# Patient Record
Sex: Female | Born: 1970
Health system: Southern US, Community
[De-identification: ages and names within clinical notes are randomized; demographics above are authoritative.]

## PROBLEM LIST (undated history)

## (undated) DIAGNOSIS — F32A Depression, unspecified: Secondary | ICD-10-CM

## (undated) DIAGNOSIS — K219 Gastro-esophageal reflux disease without esophagitis: Secondary | ICD-10-CM

## (undated) DIAGNOSIS — F329 Major depressive disorder, single episode, unspecified: Secondary | ICD-10-CM

## (undated) DIAGNOSIS — Z8489 Family history of other specified conditions: Secondary | ICD-10-CM

## (undated) DIAGNOSIS — F4310 Post-traumatic stress disorder, unspecified: Secondary | ICD-10-CM

## (undated) DIAGNOSIS — M1990 Unspecified osteoarthritis, unspecified site: Secondary | ICD-10-CM

## (undated) DIAGNOSIS — Z9889 Other specified postprocedural states: Secondary | ICD-10-CM

## (undated) DIAGNOSIS — A4902 Methicillin resistant Staphylococcus aureus infection, unspecified site: Secondary | ICD-10-CM

## (undated) DIAGNOSIS — G709 Myoneural disorder, unspecified: Secondary | ICD-10-CM

## (undated) DIAGNOSIS — L732 Hidradenitis suppurativa: Secondary | ICD-10-CM

## (undated) DIAGNOSIS — I10 Essential (primary) hypertension: Secondary | ICD-10-CM

## (undated) DIAGNOSIS — R112 Nausea with vomiting, unspecified: Secondary | ICD-10-CM

## (undated) DIAGNOSIS — E781 Pure hyperglyceridemia: Secondary | ICD-10-CM

## (undated) HISTORY — PX: OTHER SURGICAL HISTORY: SHX169

## (undated) HISTORY — DX: Hidradenitis suppurativa: L73.2

## (undated) HISTORY — PX: THORACIC DISC SURGERY: SHX801

## (undated) HISTORY — PX: COCHLEAR IMPLANT: SUR684

## (undated) HISTORY — DX: Pure hyperglyceridemia: E78.1

## (undated) HISTORY — PX: BREAST EXCISIONAL BIOPSY: SUR124

## (undated) HISTORY — PX: INNER EAR SURGERY: SHX679

## (undated) HISTORY — PX: TUBAL LIGATION: SHX77

---

## 1998-03-25 ENCOUNTER — Other Ambulatory Visit: Admission: RE | Admit: 1998-03-25 | Discharge: 1998-03-25 | Payer: Self-pay | Admitting: Family Medicine

## 1999-09-08 ENCOUNTER — Emergency Department (HOSPITAL_COMMUNITY): Admission: EM | Admit: 1999-09-08 | Discharge: 1999-09-08 | Payer: Self-pay

## 2004-09-12 HISTORY — PX: ABDOMINOPLASTY: SHX5355

## 2005-07-14 ENCOUNTER — Ambulatory Visit: Payer: Self-pay | Admitting: Family Medicine

## 2006-04-25 ENCOUNTER — Ambulatory Visit: Payer: Self-pay | Admitting: Family Medicine

## 2006-05-11 ENCOUNTER — Ambulatory Visit: Payer: Self-pay | Admitting: Family Medicine

## 2007-04-27 ENCOUNTER — Ambulatory Visit (HOSPITAL_COMMUNITY): Admission: RE | Admit: 2007-04-27 | Discharge: 2007-04-27 | Payer: Self-pay | Admitting: Obstetrics and Gynecology

## 2007-06-27 ENCOUNTER — Encounter: Payer: Self-pay | Admitting: Obstetrics & Gynecology

## 2007-06-27 ENCOUNTER — Ambulatory Visit (HOSPITAL_COMMUNITY): Admission: RE | Admit: 2007-06-27 | Discharge: 2007-06-27 | Payer: Self-pay | Admitting: Obstetrics & Gynecology

## 2008-05-28 ENCOUNTER — Other Ambulatory Visit: Admission: RE | Admit: 2008-05-28 | Discharge: 2008-05-28 | Payer: Self-pay | Admitting: Obstetrics and Gynecology

## 2008-05-30 ENCOUNTER — Ambulatory Visit (HOSPITAL_COMMUNITY): Admission: RE | Admit: 2008-05-30 | Discharge: 2008-05-30 | Payer: Self-pay | Admitting: Obstetrics & Gynecology

## 2008-06-02 ENCOUNTER — Ambulatory Visit (HOSPITAL_COMMUNITY): Admission: RE | Admit: 2008-06-02 | Discharge: 2008-06-02 | Payer: Self-pay | Admitting: Obstetrics & Gynecology

## 2008-07-02 ENCOUNTER — Encounter: Payer: Self-pay | Admitting: Obstetrics & Gynecology

## 2008-07-02 ENCOUNTER — Inpatient Hospital Stay (HOSPITAL_COMMUNITY): Admission: RE | Admit: 2008-07-02 | Discharge: 2008-07-03 | Payer: Self-pay | Admitting: Obstetrics & Gynecology

## 2008-09-12 HISTORY — PX: ABDOMINAL HYSTERECTOMY: SHX81

## 2009-08-11 ENCOUNTER — Encounter: Admission: RE | Admit: 2009-08-11 | Discharge: 2009-08-11 | Payer: Self-pay | Admitting: Family Medicine

## 2009-09-01 ENCOUNTER — Encounter: Admission: RE | Admit: 2009-09-01 | Discharge: 2009-09-01 | Payer: Self-pay | Admitting: Neurosurgery

## 2009-09-18 ENCOUNTER — Ambulatory Visit (HOSPITAL_COMMUNITY): Admission: RE | Admit: 2009-09-18 | Discharge: 2009-09-18 | Payer: Self-pay | Admitting: Neurosurgery

## 2010-11-17 ENCOUNTER — Ambulatory Visit
Admission: RE | Admit: 2010-11-17 | Discharge: 2010-11-17 | Disposition: A | Discharge status: Home / Self Care | Payer: BC Managed Care – PPO | Source: Ambulatory Visit | Attending: Neurosurgery | Admitting: Neurosurgery

## 2010-11-17 ENCOUNTER — Other Ambulatory Visit: Payer: Self-pay | Admitting: Neurosurgery

## 2010-11-17 DIAGNOSIS — M542 Cervicalgia: Secondary | ICD-10-CM

## 2010-11-17 DIAGNOSIS — M25512 Pain in left shoulder: Secondary | ICD-10-CM

## 2010-11-28 LAB — CBC
HCT: 43 % (ref 36.0–46.0)
Hemoglobin: 15.1 g/dL — ABNORMAL HIGH (ref 12.0–15.0)
MCHC: 35.2 g/dL (ref 30.0–36.0)
MCV: 95.3 fL (ref 78.0–100.0)
RDW: 12.3 % (ref 11.5–15.5)

## 2010-11-28 LAB — BASIC METABOLIC PANEL
CO2: 27 mEq/L (ref 19–32)
Glucose, Bld: 126 mg/dL — ABNORMAL HIGH (ref 70–99)
Potassium: 4.3 mEq/L (ref 3.5–5.1)
Sodium: 137 mEq/L (ref 135–145)

## 2010-11-29 ENCOUNTER — Other Ambulatory Visit: Payer: Self-pay | Admitting: Obstetrics & Gynecology

## 2010-11-29 DIAGNOSIS — Z1231 Encounter for screening mammogram for malignant neoplasm of breast: Secondary | ICD-10-CM

## 2010-12-10 ENCOUNTER — Ambulatory Visit
Admission: RE | Admit: 2010-12-10 | Discharge: 2010-12-10 | Disposition: A | Discharge status: Home / Self Care | Payer: BC Managed Care – PPO | Source: Ambulatory Visit | Attending: Obstetrics & Gynecology | Admitting: Obstetrics & Gynecology

## 2010-12-10 DIAGNOSIS — Z1231 Encounter for screening mammogram for malignant neoplasm of breast: Secondary | ICD-10-CM

## 2011-01-25 NOTE — Op Note (Signed)
NAMEJANECIA, Aimee [name]              ACCOUNT NO.:  [id number]   MEDICAL RECORD NO.:  [id number]          PATIENT TYPE:  AMB   LOCATION:  DAY                           FACILITY:  [facility]   PHYSICIAN:  Florian Buff, M.D.   DATE OF BIRTH:  1970/10/10   DATE OF PROCEDURE:  06/27/2007  DATE OF DISCHARGE:                               OPERATIVE REPORT   PREOPERATIVE DIAGNOSIS:  1. Menometrorrhagia.  2. Dysmenorrhea.   POSTOPERATIVE DIAGNOSIS:  1. Menometrorrhagia.  2. Dysmenorrhea.   PROCEDURE:  Hysteroscopy, D&C, endometrial ablation.   SURGEON:  Florian Buff, M.D.   ANESTHESIA:  General endotracheal.   FINDINGS:  The patient had a normal endometrial cavity, minimal amount  of endometrial tissue, no polyps, and no fibroids.   DESCRIPTION OF PROCEDURE:  The patient was taken to the operating room  and placed in the supine position where she underwent general  endotracheal anesthesia, placed in the dorsal lithotomy position and  prepped and draped in the usual sterile fashion.  Graves speculum was  placed.  Cervix was grasped.  Her cervix was dilated serially to allow  passage of the hysteroscope.  Hysteroscopy was performed and found to be  normal.  There were no polyps and no abnormalities at all.  ThermaChoice  3 endometrial ablation balloon was used, 19 mL of D5W required to  maintain appropriate pressure in the balloon during the entire  procedure.  Heated to 87 degrees Celsius.  Total therapy time was 9  minutes 14 seconds.  All the fluids were returned at the end of the  procedure.  The patient tolerated the procedure well.  She experienced  normal blood loss and taken to the recovery room in good stable  condition.  All needle, sponge, and instrument counts correct x3.      Florian Buff, M.D.  Electronically Signed     LHE/MEDQ  D:  06/27/2007  T:  06/27/2007  Job:  [id number]

## 2011-01-25 NOTE — Op Note (Signed)
Aimee [name], Aimee [name]              ACCOUNT NO.:  [id number]   MEDICAL RECORD NO.:  [id number]          PATIENT TYPE:  INP   LOCATION:  A305                          FACILITY:  [facility]   PHYSICIAN:  Florian Buff, M.D.   DATE OF BIRTH:  [birth year]   DATE OF PROCEDURE:  07/02/2008  DATE OF DISCHARGE:                               OPERATIVE REPORT   PREOPERATIVE DIAGNOSES:  1. Menometrorrhagia.  2. Dysmenorrhea.  3. Status post hysteroscopy, dilation and curettage, and endometrial      ablation a year ago.   POSTOPERATIVE DIAGNOSES:  1. Menometrorrhagia.  2. Dysmenorrhea.  3. Status post hysteroscopy, dilation and curettage, and endometrial      ablation a year ago.   PROCEDURE:  Total abdominal hysterectomy, left salpingo-oophorectomy.   SURGEON:  Florian Buff, MD   ANESTHESIA:  General endotracheal.   FINDINGS:  The patient has broad-based adhesions of her uterus to the  anterior abdominal wall on the left.  Her bladder was also adherent on  the side.  I think this is source of her pain.  Her right tube and ovary  were normal.  Otherwise, everything was normal.   DESCRIPTION OF OPERATION:  The patient was taken to the operating room  and placed in the supine position where she underwent general  endotracheal anesthesia.  The vagina was prepped.  Foley catheter was  placed.  The abdomen was prepped and draped in usual sterile fashion.  A  Pfannenstiel skin incision was made, carried down sharply to the rectus  fascia, scored in midline, and extended laterally.  The fascia was taken  off the muscles superiorly and inferiorly without difficulty.  The  muscles were divided and the peritoneal cavity was entered.  Alexis self-  retaining wound retractor was placed.  The left ovary was cross clamped  and cut.  The right round ligament was then suture ligated and cut.  The  utero-ovarian ligament on the right was then clamped, cut, and suture  ligated.  The round ligament on the  left was then identified that was  suture ligated and cut.  The infundibulopelvic ligament on the left was  then clamped and cut.  The bladder was taken down carefully off lower  uterine segment and the anterior abdominal wall.  The uterine vessels  were clamped, cut, and suture ligated bilaterally.  Serial pedicles were  taken down the cervix to the cardinal ligaments, each pedicle being  clamped, cut, and suture ligated.  The vagina was cross clamped and  specimen was removed. The vaginal angle sutures were placed and the  vagina was closed with interrupted figure-of-eight sutures.  The pelvis  was irrigated vigorously.  All pedicles were found to be hemostatic.  Inner sheath was placed.  The Alexis retractor was removed.  The muscles  were reapproximated loosely.  The fascia was closed using 0-Vicryl in a  running.  The subcutaneous tissues were reapproximated in 2 layers and a  subcuticular suture was then performed and Dermabond was placed.  The  patient tolerated the procedure well.  She experienced 350 mL of blood  loss.  She was taken to the recovery room in good and stable condition.  All counts were correct x3.      Florian Buff, M.D.  Electronically Signed     LHE/MEDQ  D:  07/02/2008  T:  07/03/2008  Job:  [id number]

## 2011-01-28 NOTE — Discharge Summary (Signed)
[name], Aimee [name]              ACCOUNT NO.:  [id number]   MEDICAL RECORD NO.:  [id number]          PATIENT TYPE:  INP   LOCATION:  A305                          FACILITY:  [facility]   PHYSICIAN:  Florian Buff, M.D.   DATE OF BIRTH:  1970-11-20   DATE OF ADMISSION:  07/02/2008  DATE OF DISCHARGE:  10/22/2009LH                               DISCHARGE SUMMARY   DISCHARGE DIAGNOSES:  1. Status post total abdominal hysterectomy-left salpingo-      oophorectomy.  2. Unremarkable postoperative course.   PROCEDURE:  Total abdominal hysterectomy-left salpingo-oophorectomy.   Please refer the history and physical as well as operative report,  details of admission hospital.   HOSPITAL COURSE:  The patient is admitted postoperatively.  Started on  clear liquids and regular diet well without symptoms, was extensively  ambulatory.  She really wanted to go home on the morning postop day #1  and she was doing well.  Her hemoglobin and hematocrit were stable at  11.4 and 32.6.  Her abdominal exam was benign.  Her incisions clean,  dry, and intact.  As a result, she was discharged to home in the morning  on postop day #1 in good and stable condition to follow up in the office  next week.  She was given Dilaudid and Motrin for pain and she will be  seen in the office followup next week.      Florian Buff, M.D.  Electronically Signed     LHE/MEDQ  D:  08/06/2008  T:  08/06/2008  Job:  [id number]

## 2011-03-07 ENCOUNTER — Other Ambulatory Visit: Payer: Self-pay | Admitting: Neurosurgery

## 2011-03-07 ENCOUNTER — Ambulatory Visit
Admission: RE | Admit: 2011-03-07 | Discharge: 2011-03-07 | Disposition: A | Discharge status: Home / Self Care | Payer: BC Managed Care – PPO | Source: Ambulatory Visit | Attending: Neurosurgery | Admitting: Neurosurgery

## 2011-03-07 DIAGNOSIS — M542 Cervicalgia: Secondary | ICD-10-CM

## 2011-06-13 LAB — HCG, QUANTITATIVE, PREGNANCY: hCG, Beta Chain, Quant, S: <2

## 2011-06-13 LAB — TYPE AND SCREEN

## 2011-06-13 LAB — COMPREHENSIVE METABOLIC PANEL
ALT: 17
AST: 15
Calcium: 8.9
GFR calc Af Amer: >60
Glucose, Bld: 97
Sodium: 135
Total Protein: 6.3

## 2011-06-13 LAB — URINE MICROSCOPIC-ADD ON

## 2011-06-13 LAB — DIFFERENTIAL
Basophils Absolute: 0
Basophils Relative: 0
Eosinophils Absolute: 0.1
Eosinophils Relative: 1
Lymphocytes Relative: 30

## 2011-06-13 LAB — CBC
HCT: 32.6 — ABNORMAL LOW
MCHC: 33.8
MCHC: 35
MCV: 96.3
Platelets: 211
RDW: 12.5
RDW: 12.6

## 2011-06-13 LAB — URINALYSIS, ROUTINE W REFLEX MICROSCOPIC
Bilirubin Urine: NEGATIVE
Nitrite: NEGATIVE
Specific Gravity, Urine: <1.005 — ABNORMAL LOW
pH: 6

## 2011-06-22 LAB — URINALYSIS, ROUTINE W REFLEX MICROSCOPIC
Bilirubin Urine: NEGATIVE
Glucose, UA: NEGATIVE
Hgb urine dipstick: NEGATIVE
Ketones, ur: NEGATIVE
Nitrite: NEGATIVE
Protein, ur: NEGATIVE
Specific Gravity, Urine: 1.015
Urobilinogen, UA: 0.2
pH: 6

## 2011-06-23 LAB — COMPREHENSIVE METABOLIC PANEL
ALT: 16
AST: 14
Albumin: 3.7
Alkaline Phosphatase: 45
BUN: 8
CO2: 22
Calcium: 8.6
Chloride: 107
Creatinine, Ser: 0.58
GFR calc Af Amer: >60
GFR calc non Af Amer: >60
Glucose, Bld: 114 — ABNORMAL HIGH
Potassium: 3.8
Sodium: 135
Total Bilirubin: 0.5
Total Protein: 6.3

## 2011-06-23 LAB — DIFFERENTIAL
Basophils Absolute: 0
Basophils Relative: 1
Eosinophils Absolute: 0.2
Eosinophils Relative: 3
Lymphocytes Relative: 35
Lymphs Abs: 2.7
Monocytes Absolute: 0.3
Monocytes Relative: 3
Neutro Abs: 4.6
Neutrophils Relative %: 58

## 2011-06-23 LAB — CBC
HCT: 40.1
Hemoglobin: 13.8
MCHC: 34.4
MCV: 93.8
Platelets: 253
RBC: 4.28
RDW: 12.6
WBC: 7.8

## 2011-08-01 ENCOUNTER — Ambulatory Visit
Admission: RE | Admit: 2011-08-01 | Discharge: 2011-08-01 | Disposition: A | Discharge status: Home / Self Care | Payer: BC Managed Care – PPO | Source: Ambulatory Visit | Attending: Family Medicine | Admitting: Family Medicine

## 2011-08-01 ENCOUNTER — Other Ambulatory Visit: Payer: Self-pay | Admitting: Family Medicine

## 2011-08-01 DIAGNOSIS — R1011 Right upper quadrant pain: Secondary | ICD-10-CM

## 2011-08-08 ENCOUNTER — Other Ambulatory Visit: Payer: Self-pay | Admitting: Family Medicine

## 2011-08-08 DIAGNOSIS — R1011 Right upper quadrant pain: Secondary | ICD-10-CM

## 2011-08-08 DIAGNOSIS — R12 Heartburn: Secondary | ICD-10-CM

## 2011-08-16 ENCOUNTER — Other Ambulatory Visit (HOSPITAL_COMMUNITY): Payer: BC Managed Care – PPO

## 2011-09-23 ENCOUNTER — Encounter: Payer: BC Managed Care – PPO | Attending: Adult Health | Admitting: *Deleted

## 2011-09-23 ENCOUNTER — Encounter: Payer: Self-pay | Admitting: *Deleted

## 2011-09-23 DIAGNOSIS — Z713 Dietary counseling and surveillance: Secondary | ICD-10-CM | POA: Insufficient documentation

## 2011-09-23 DIAGNOSIS — E119 Type 2 diabetes mellitus without complications: Secondary | ICD-10-CM | POA: Insufficient documentation

## 2011-09-23 NOTE — Progress Notes (Addendum)
Medical Nutrition Therapy:  Appt start time: 1030a   end time:  1200p.  Assessment:  Primary concerns today: T2DM, new onset w/ BGM.  Pt here with new onset T2DM for education and BGM. Reports severe hypoglycemic episode last night at work; sx included excessive perspiration, shakiness, weakness, and diarrhea/vomiting. Called husband after eating a few pieces of candy. Pt had not eaten since 11am. Excessive daily meal skipping, meals out, and lack of physical acitivity noted. Treating highs/lows, avoidance of meal skipping, and importance of exercise among topics discussed. Gave pt Verio IQ meter and instructed on use. Pt verbalized understanding of instruction. Pt will need prescription for strips/lancets.        MEDICATIONS: See medication list; provided by pt at visit.  OTHER: Reports abdomnioplasty in 2006 after wt loss of 100+ lbs during previous 2 years.   DIETARY INTAKE:  Usual eating pattern includes 1-2 meals and 2-3 snacks per day.  24-hr recall:  B (11 AM): Eggs, fried bologna on wheat toast (3); crystal light  Snk (1 AM): handful of raisins Snk (1-2 hrs later): handful of dried cranberries "CRASHED" (low BG)   AT 6:30pm L ( PM): none Snk ( PM): none D ( PM): [employer] Snk ( PM): pretzels, light popcorn  Usual physical activity: None  Estimated energy needs: 1200-1400 calories 135-165 g carbohydrates 90-105 g protein 35-40 g fat  Progress Towards Goal(s):  In progress.   Nutritional Diagnosis:  [place]-2.1 Impaired nutrient utilization related to glucose metabolism as evidenced by recent diagnosis of T2DM and MD referral for education and blood glucose monitoring instruction.    Intervention/Goals (Overall plan):  Follow Diabetes Meal Plan as instructed (yellow card).  Eat 3 meals and 2-3 snacks daily; AVOID meal skipping, sweets, and sugary drinks!! :)  Limit carbohydrate intake to 30-45 grams carbohydrate/meal.  Limit carbohydrate intake to 15 grams  carbohydrate/snack.  Add lean protein foods to all meals/snacks.  Aim for 25-30 g of fiber daily.  Monitor glucose levels 1-2 times daily (fasting and after meal) or as instructed by your doctor.  Aim for 30 min or more of physical activity at least 3 days a week.  Bring food record and glucose log to your next nutrition visit.  Handouts given during visit include:  Living Well with Diabetes - [employer]  30g and 45g CHO meal plans  Snack List  Carb Counting Guide   Mr [place] Neighbor and Easy Diabetic Cooking (cookbook)  Samples dispensed:  Verio IQ starter kit Lot #: [id number] X Exp: 10/2012   Reli-On Glucose Tabs (4 pc/pack): 6 packs  Monitoring/Evaluation:  Dietary intake, exercise, A1c (as available), and body weight in 2-3weeks. Pt to check insurance coverage/work schedule and call for f/u appt.

## 2011-09-24 ENCOUNTER — Encounter: Payer: Self-pay | Admitting: *Deleted

## 2011-09-24 NOTE — Patient Instructions (Addendum)
Goals:  Follow Diabetes Meal Plan as instructed (yellow card).  Eat 3 meals and 2-3 snacks daily; AVOID meal skipping, sweets, and sugary drinks!! :)  Limit carbohydrate intake to 30-45 grams carbohydrate/meal.  Limit carbohydrate intake to 15 grams carbohydrate/snack.  Add lean protein foods to all meals/snacks.  Aim for 25-30 g of fiber daily.  Monitor glucose levels 1-2 times daily (fasting and after meal) or as instructed by your doctor.  Aim for 30 min or more of physical activity at least 3 days a week.  Bring food record and glucose log to your next nutrition visit.  Check insurance coverage/work schedule and call for f/u appt.

## 2011-12-14 ENCOUNTER — Other Ambulatory Visit: Payer: Self-pay | Admitting: Obstetrics & Gynecology

## 2011-12-14 DIAGNOSIS — Z1231 Encounter for screening mammogram for malignant neoplasm of breast: Secondary | ICD-10-CM

## 2011-12-29 ENCOUNTER — Ambulatory Visit
Admission: RE | Admit: 2011-12-29 | Discharge: 2011-12-29 | Disposition: A | Discharge status: Home / Self Care | Payer: BC Managed Care – PPO | Source: Ambulatory Visit | Attending: Obstetrics & Gynecology | Admitting: Obstetrics & Gynecology

## 2011-12-29 ENCOUNTER — Other Ambulatory Visit: Payer: Self-pay | Admitting: Obstetrics & Gynecology

## 2011-12-29 DIAGNOSIS — N63 Unspecified lump in unspecified breast: Secondary | ICD-10-CM

## 2011-12-29 DIAGNOSIS — Z1231 Encounter for screening mammogram for malignant neoplasm of breast: Secondary | ICD-10-CM

## 2012-01-08 ENCOUNTER — Emergency Department (INDEPENDENT_AMBULATORY_CARE_PROVIDER_SITE_OTHER): Payer: BC Managed Care – PPO

## 2012-01-08 ENCOUNTER — Emergency Department (HOSPITAL_COMMUNITY)
Admission: EM | Admit: 2012-01-08 | Discharge: 2012-01-08 | Disposition: A | Discharge status: Home / Self Care | Payer: BC Managed Care – PPO | Source: Home / Self Care | Attending: Family Medicine | Admitting: Family Medicine

## 2012-01-08 ENCOUNTER — Encounter (HOSPITAL_COMMUNITY): Payer: Self-pay

## 2012-01-08 DIAGNOSIS — M25519 Pain in unspecified shoulder: Secondary | ICD-10-CM

## 2012-01-08 DIAGNOSIS — M25512 Pain in left shoulder: Secondary | ICD-10-CM

## 2012-01-08 MED ORDER — MEPERIDINE HCL 50 MG PO TABS: 50.0000 mg | ORAL_TABLET | Freq: Four times a day (QID) | ORAL | Status: AC | PRN

## 2012-01-08 MED ORDER — IBUPROFEN 600 MG PO TABS: 600.0000 mg | ORAL_TABLET | Freq: Three times a day (TID) | ORAL | Status: AC | PRN

## 2012-01-08 MED ORDER — CYCLOBENZAPRINE HCL 10 MG PO TABS: 10.0000 mg | ORAL_TABLET | Freq: Two times a day (BID) | ORAL | Status: AC | PRN

## 2012-01-08 NOTE — ED Provider Notes (Addendum)
History     CSN: [id number]  Arrival date & time 01/08/12  1508   First MD Initiated Contact with Patient 01/08/12 1516      Chief Complaint  Patient presents with  . Shoulder Injury    2 day hx of shoulder pain.  on friday was reaching for something on the table, when she felt her shoulder "pop".  has happened to pt. in past as well.     (Consider location/radiation/quality/duration/timing/severity/associated sxs/prior treatment) HPI Comments: 41 year old female smoker with history of diabetes and neck surgery in 2010. Here complaining of severe left shoulder pain with left arm movement for 2 days. Patient states that she was reaching over a dining table, heard a pop and started to feel pain and difficulty to raise her left arm. Symptoms appear worse today despite outpatient taking leftover Demerol pills and Flexeril. Has not taken any anti-inflammatory medication. Works as Optometrist. Patient has severe pruritus allergies to Vicodin, Percocet and tramadol. States the only oral narcotic she has been able to take without side effects is Demerol.   Past Medical History  Diagnosis Date  . Diabetes mellitus   . Hypertriglyceridemia     Past Surgical History  Procedure Date  . Abdominoplasty 2006  . Inner ear surgery     12 surgeries on R ear starting in 1984; implant 06/2011  . Thoracic disc surgery 2011, 2012    x2  . Abdominal hysterectomy 2010  . Cesarean section 1991, 1995    x2    Family History  Problem Relation Age of Onset  . GI problems Mother   . Diabetes Father     History  Substance Use Topics  . Smoking status: Current Everyday Smoker -- 1.0 packs/day    Types: Cigarettes  . Smokeless tobacco: Not on file  . Alcohol Use: Yes    OB History    Grav Para Term Preterm Abortions TAB SAB Ect Mult Living                  Review of Systems  Constitutional: Negative for fever and chills.  HENT: Negative for ear pain, sore throat, neck pain and neck  stiffness.   Respiratory: Negative for cough and shortness of breath.   Cardiovascular: Negative for chest pain, palpitations and leg swelling.  Musculoskeletal:       Left shoulder pain as per HPI   Skin: Negative for rash.  Neurological: Negative for headaches.    Allergies  Hydrocodone  Home Medications   Current Outpatient Rx  Name Route Sig Dispense Refill  . VITAMIN C 1000 MG PO TABS Oral Take 1,000 mg by mouth daily.    . ASPIRIN 325 MG PO TABS Oral Take 325 mg by mouth daily.    Marland Kitchen CALCIUM CARBONATE 600 MG PO TABS Oral Take 600 mg by mouth 2 (two) times daily with a meal.    . CARISOPRODOL 250 MG PO TABS Oral Take 250 mg by mouth 3 (three) times daily.    . CHOLINE FENOFIBRATE 135 MG PO CPDR Oral Take 135 mg by mouth daily.    Marland Kitchen FLAX SEED OIL PO Oral Take 1,200 mg by mouth daily.    Marland Kitchen GARLIC 7858 MG PO CAPS Oral Take 1 capsule by mouth daily.    Marland Kitchen METFORMIN HCL 500 MG PO TABS Oral Take 500 mg by mouth daily.    Marland Kitchen MINOCYCLINE HCL PO Oral Take by mouth 2 (two) times daily.    Marland Kitchen NIACIN ER (ANTIHYPERLIPIDEMIC) 1000  MG PO TBCR Oral Take 1,000 mg by mouth at bedtime.    Marland Kitchen FISH OIL 300 MG PO CAPS Oral Take 1 capsule by mouth 2 (two) times daily.    Marland Kitchen OMEPRAZOLE 20 MG PO CPDR Oral Take 20 mg by mouth daily.    Marland Kitchen SIMVASTATIN 20 MG PO TABS Oral Take 20 mg by mouth daily.    . CYCLOBENZAPRINE HCL 10 MG PO TABS Oral Take 1 tablet (10 mg total) by mouth 2 (two) times daily as needed for muscle spasms. 20 tablet 0  . IBUPROFEN 600 MG PO TABS Oral Take 1 tablet (600 mg total) by mouth every 8 (eight) hours as needed for pain. 20 tablet 0  . MEPERIDINE HCL 50 MG PO TABS Oral Take 1 tablet (50 mg total) by mouth every 6 (six) hours as needed for pain. 8 tablet 0    BP 120/82  Pulse 85  Temp(Src) 98.2 F (36.8 C) (Oral)  Resp 17  SpO2 98%  Physical Exam  Nursing note and vitals reviewed. Constitutional: She is oriented to person, place, and time. She appears well-developed and  well-nourished. No distress.       Uncomfortable with pain.  HENT:  Head: Normocephalic and atraumatic.  Neck: Neck supple.       No neck tenderness to palpation or movement. Fair range of motion. Negative Spurling test.  Cardiovascular: Normal rate, regular rhythm and normal heart sounds.   Pulmonary/Chest: Effort normal and breath sounds normal. No respiratory distress. She has no wheezes. She has no rales. She exhibits no tenderness.  Musculoskeletal:       Left shoulder: impress somehow flattened and dropped compared with right side. No swelling or erythema.  Diffused tenderness with palpation in anterior lateral distribution with focal tenderness over AC joint. difficult examination as patient reports severe pain and shoulder weakness. Unable to abduct arm pass 10 degree. Also pain and limited range of movement with adduction. Patient reports severe pain as well with passive abduction and passive internal and external arm rotation.  Intact superficial sensation.  Normal examination of elbow, forearm and hand with no weakness or numbness. Normal distal radial and ulnar pulses.   Neurological: She is alert and oriented to person, place, and time.  Skin: No rash noted.    ED Course  Procedures (including critical care time)  Labs Reviewed - No data to display Dg Shoulder Left  01/08/2012  *RADIOLOGY REPORT*  Clinical Data: Left shoulder pain after reaching 2 days ago.  LEFT SHOULDER - 2+ VIEW  Comparison: Cervical spine CT 09/01/2009.  Findings: The mineralization and alignment are normal.  There is no evidence of acute fracture or dislocation.  The subacromial space is preserved.  There are mild acromioclavicular degenerative changes.  Postsurgical changes are noted in the lower cervical spine status post fusion.  IMPRESSION: No acute osseous findings.  Original Report Authenticated By: Vivia Ewing, M.D.     1. Shoulder pain, left       MDM  Mechanism of injury and xray  findings suggestive of AC joint sprain but reported pain with passive motion suggest labral tear or disease of the capsula; shoulder weakness to arm elevation suggest rotator cuff tendon tear.  Asked to keep using the sling. As per patient request prescribe Demerol is the only narcotic that she can tolerate without side effects, also refilled Flexeril and prescribed ibuprofen. Recommended close followup with the orthopedic doctor.  Note: pt with reported allergies to other oral narcotics than  demerol, offered Toradol injection but declined stating "probably that wont be as strong as demerol I am already taking".        Randa Spike, MD 01/09/12 1037  Alizeh Madril Moreno-Coll, MD 01/09/12 1040

## 2012-01-08 NOTE — ED Notes (Signed)
2 day hx of shoulder pain.  on friday was reaching for something on the table, when she felt her shoulder "pop".  has happened to pt. in past as well.

## 2012-01-08 NOTE — Discharge Instructions (Signed)
There are no bone fractures or displacements in your x-rays. It is possible that you have a rotator cuff tendon rupture or you have a tear in the shoulder joint capsule given the severity of your pain and the inability to raise your arm even with assistance. My recommendation is that you followup tomorrow with a orthopedic doctor in 2 very likely will require an MRI of your shoulder. In the meantime take the prescribed medications as instructed. Be aware that Flexeril and Demerol can make you drowsy and he should not drive after taking this medication. Continue to use a arm sling until you see the orthopedic specialist in the next following days.

## 2012-08-01 ENCOUNTER — Other Ambulatory Visit: Payer: Self-pay | Admitting: Family Medicine

## 2012-08-01 DIAGNOSIS — N649 Disorder of breast, unspecified: Secondary | ICD-10-CM

## 2012-08-08 ENCOUNTER — Other Ambulatory Visit: Payer: BC Managed Care – PPO

## 2012-08-10 ENCOUNTER — Ambulatory Visit
Admission: RE | Admit: 2012-08-10 | Discharge: 2012-08-10 | Disposition: A | Discharge status: Home / Self Care | Payer: BC Managed Care – PPO | Source: Ambulatory Visit | Attending: Family Medicine | Admitting: Family Medicine

## 2012-08-10 DIAGNOSIS — N649 Disorder of breast, unspecified: Secondary | ICD-10-CM

## 2012-10-31 ENCOUNTER — Other Ambulatory Visit: Payer: Self-pay | Admitting: Physician Assistant

## 2012-10-31 DIAGNOSIS — D229 Melanocytic nevi, unspecified: Secondary | ICD-10-CM

## 2012-10-31 HISTORY — DX: Melanocytic nevi, unspecified: D22.9

## 2012-11-22 ENCOUNTER — Other Ambulatory Visit: Payer: Self-pay | Admitting: Physician Assistant

## 2012-12-13 ENCOUNTER — Other Ambulatory Visit: Payer: Self-pay

## 2012-12-13 DIAGNOSIS — Z1231 Encounter for screening mammogram for malignant neoplasm of breast: Secondary | ICD-10-CM

## 2013-01-03 ENCOUNTER — Telehealth: Payer: Self-pay | Admitting: Family Medicine

## 2013-01-03 NOTE — Telephone Encounter (Signed)
PT STATES THAT HER SYMPTOMS HAVE BEEN GOING ON ANYWHERE FROM 2 MO TO 6 MOS. SHE THINK IT IS MORE MUSCLE AND/OR JOINT RELATED. PT OFFERED A Monday APPT- BUT SHE WILL POSSIBLY TRY URGENT CARE - IF DECIDES TO TAKE MON APPT- SHE WILL CALL us BACK

## 2013-01-14 ENCOUNTER — Ambulatory Visit: Payer: BC Managed Care – PPO

## 2013-01-31 ENCOUNTER — Telehealth: Payer: Self-pay | Admitting: Nurse Practitioner

## 2013-01-31 ENCOUNTER — Telehealth: Payer: Self-pay | Admitting: Adult Health

## 2013-01-31 MED ORDER — FLUCONAZOLE 150 MG PO TABS: ORAL_TABLET | ORAL | Status: DC

## 2013-01-31 NOTE — Telephone Encounter (Signed)
Has had some urinary incontinence and low pelvic discomfort, has yellow stool too.Needs to make appt. She is taking antibiotic and wants diflucan, will call in.

## 2013-01-31 NOTE — Telephone Encounter (Signed)
No answer

## 2013-01-31 NOTE — Addendum Note (Signed)
Addended by: Derrek Monaco A on: 01/31/2013 07:29 PM   Modules accepted: Orders

## 2013-02-05 NOTE — Telephone Encounter (Signed)
LM ON 5/22 AND 5/23- NO RESPONSE FROM PT- SHE CAN CALL BACK IF APPT STILL NEEDED

## 2013-02-08 ENCOUNTER — Other Ambulatory Visit: Payer: Self-pay | Admitting: Family Medicine

## 2013-02-20 ENCOUNTER — Ambulatory Visit
Admission: RE | Admit: 2013-02-20 | Discharge: 2013-02-20 | Disposition: A | Discharge status: Home / Self Care | Payer: BC Managed Care – PPO | Source: Ambulatory Visit

## 2013-02-20 DIAGNOSIS — Z1231 Encounter for screening mammogram for malignant neoplasm of breast: Secondary | ICD-10-CM

## 2013-03-05 ENCOUNTER — Encounter: Payer: Self-pay | Admitting: Physician Assistant

## 2013-03-05 ENCOUNTER — Ambulatory Visit (INDEPENDENT_AMBULATORY_CARE_PROVIDER_SITE_OTHER): Payer: BC Managed Care – PPO | Admitting: Physician Assistant

## 2013-03-05 VITALS — BP 117/82 | HR 88 | Temp 98.2°F | Wt 222.0 lb

## 2013-03-05 DIAGNOSIS — E119 Type 2 diabetes mellitus without complications: Secondary | ICD-10-CM | POA: Insufficient documentation

## 2013-03-05 DIAGNOSIS — F17200 Nicotine dependence, unspecified, uncomplicated: Secondary | ICD-10-CM | POA: Insufficient documentation

## 2013-03-05 DIAGNOSIS — IMO0001 Reserved for inherently not codable concepts without codable children: Secondary | ICD-10-CM | POA: Insufficient documentation

## 2013-03-05 DIAGNOSIS — IMO0002 Reserved for concepts with insufficient information to code with codable children: Secondary | ICD-10-CM

## 2013-03-05 MED ORDER — CYCLOBENZAPRINE HCL 10 MG PO TABS: 10.0000 mg | ORAL_TABLET | Freq: Three times a day (TID) | ORAL | Status: DC | PRN

## 2013-03-05 NOTE — Patient Instructions (Signed)

## 2013-03-05 NOTE — Progress Notes (Signed)
Subjective:     Patient ID: Aimee [name], female   DOB: 10/30/1970, 42 y.o.   MRN: [id number]  HPI Pt with a long hx of intermit chronic back pain Prev hx of multi level cervical fusion due to disc dz She has been seen intermit and given rx for muscle relaxants which help but sx then return Now with pain to the L shoulder and weakness to the lower ext She denies any trauma Wanting referral back to Neurosurg or imaging studies   Review of Systems  All other systems reviewed and are negative.       Objective:   Physical Exam  Nursing note and vitals reviewed. Pt unable to sit still for long + TTP along the entire T- spine area FROM L-spine but sx with flexion, extension, rotation at the extremes SLR neg LE muscle strength equal bilat DTR 1+/= lower ext Pulses/sensory good distal     Assessment:     Chronic back pain    Plan:    Given complicated hx and myriad of sx return to Dr Hal Neer Rf of Flexeril No studies at this time F/U prn

## 2013-03-13 ENCOUNTER — Other Ambulatory Visit: Payer: Self-pay | Admitting: Family Medicine

## 2013-03-25 ENCOUNTER — Other Ambulatory Visit: Payer: Self-pay | Admitting: Neurosurgery

## 2013-03-25 DIAGNOSIS — M5412 Radiculopathy, cervical region: Secondary | ICD-10-CM

## 2013-03-25 DIAGNOSIS — M549 Dorsalgia, unspecified: Secondary | ICD-10-CM

## 2013-03-28 ENCOUNTER — Ambulatory Visit
Admission: RE | Admit: 2013-03-28 | Discharge: 2013-03-28 | Disposition: A | Discharge status: Home / Self Care | Payer: BC Managed Care – PPO | Source: Ambulatory Visit | Attending: Neurosurgery | Admitting: Neurosurgery

## 2013-03-28 VITALS — BP 109/76 | HR 77

## 2013-03-28 DIAGNOSIS — M549 Dorsalgia, unspecified: Secondary | ICD-10-CM

## 2013-03-28 DIAGNOSIS — M5412 Radiculopathy, cervical region: Secondary | ICD-10-CM

## 2013-03-28 DIAGNOSIS — IMO0002 Reserved for concepts with insufficient information to code with codable children: Secondary | ICD-10-CM

## 2013-03-28 MED ORDER — HYDROMORPHONE HCL PF 2 MG/ML IJ SOLN
2.0000 mg | Freq: Once | INTRAMUSCULAR | Status: AC
  Administered 2013-03-28: 1 mg via INTRAMUSCULAR

## 2013-03-28 MED ORDER — ONDANSETRON HCL 4 MG/2ML IJ SOLN
4.0000 mg | Freq: Once | INTRAMUSCULAR | Status: AC
  Administered 2013-03-28: 4 mg via INTRAMUSCULAR

## 2013-03-28 MED ORDER — IOHEXOL 300 MG/ML  SOLN
10.0000 mL | Freq: Once | INTRAMUSCULAR | Status: AC | PRN
  Administered 2013-03-28: 10 mL via INTRATHECAL

## 2013-03-28 MED ORDER — DIAZEPAM 5 MG PO TABS
10.0000 mg | ORAL_TABLET | Freq: Once | ORAL | Status: AC
  Administered 2013-03-28: 10 mg via ORAL

## 2013-03-28 MED ORDER — HYDROMORPHONE HCL PF 2 MG/ML IJ SOLN
2.0000 mg | Freq: Once | INTRAMUSCULAR | Status: AC
  Administered 2013-03-28: 2 mg via INTRAMUSCULAR

## 2013-03-28 NOTE — Progress Notes (Signed)
Patient's mother notified of 1015 discharge time.

## 2013-04-01 ENCOUNTER — Ambulatory Visit
Admission: RE | Admit: 2013-04-01 | Discharge: 2013-04-01 | Disposition: A | Discharge status: Home / Self Care | Payer: BC Managed Care – PPO | Source: Ambulatory Visit | Attending: Neurosurgery | Admitting: Neurosurgery

## 2013-04-01 ENCOUNTER — Other Ambulatory Visit: Payer: Self-pay | Admitting: Neurosurgery

## 2013-04-01 DIAGNOSIS — G971 Other reaction to spinal and lumbar puncture: Secondary | ICD-10-CM

## 2013-04-01 MED ORDER — IOHEXOL 180 MG/ML  SOLN
1.0000 mL | Freq: Once | INTRAMUSCULAR | Status: AC | PRN
  Administered 2013-04-01: 1 mL via EPIDURAL

## 2013-04-01 NOTE — Progress Notes (Signed)
1420 pt returned from blood patch to nursing area. Pt resting quietly.

## 2013-04-01 NOTE — Progress Notes (Signed)
Blood drawn for blood patch from right AC. Site is unremarkable, pt tolerated well and 20 cc obtained

## 2013-04-23 ENCOUNTER — Other Ambulatory Visit (INDEPENDENT_AMBULATORY_CARE_PROVIDER_SITE_OTHER): Payer: BC Managed Care – PPO

## 2013-04-23 DIAGNOSIS — E119 Type 2 diabetes mellitus without complications: Secondary | ICD-10-CM

## 2013-04-23 DIAGNOSIS — E669 Obesity, unspecified: Secondary | ICD-10-CM

## 2013-04-23 DIAGNOSIS — E785 Hyperlipidemia, unspecified: Secondary | ICD-10-CM

## 2013-04-23 NOTE — Progress Notes (Signed)
Patient came in with orders for Dr.Nida

## 2013-04-24 LAB — CMP14+EGFR
AST: 27 IU/L (ref 0–40)
BUN: 7 mg/dL (ref 6–24)
CO2: 23 mmol/L (ref 18–29)
Calcium: 9.6 mg/dL (ref 8.7–10.2)
Chloride: 105 mmol/L (ref 97–108)
Creatinine, Ser: 0.58 mg/dL (ref 0.57–1.00)
Globulin, Total: 1.9 g/dL (ref 1.5–4.5)
Sodium: 142 mmol/L (ref 134–144)

## 2013-04-24 LAB — LIPID PANEL
Chol/HDL Ratio: 5.9 ratio units — ABNORMAL HIGH (ref 0.0–4.4)
Cholesterol, Total: 225 mg/dL — ABNORMAL HIGH (ref 100–199)
HDL: 38 mg/dL — ABNORMAL LOW (ref >39)
Triglycerides: 267 mg/dL — ABNORMAL HIGH (ref 0–149)

## 2013-04-24 LAB — TSH: TSH: 1.31 u[IU]/mL (ref 0.450–4.500)

## 2013-05-15 ENCOUNTER — Other Ambulatory Visit: Payer: Self-pay | Admitting: Adult Health

## 2013-06-15 ENCOUNTER — Other Ambulatory Visit: Payer: Self-pay | Admitting: Adult Health

## 2013-07-18 ENCOUNTER — Other Ambulatory Visit: Payer: Self-pay

## 2013-07-20 ENCOUNTER — Other Ambulatory Visit: Payer: Self-pay | Admitting: Adult Health

## 2013-07-25 ENCOUNTER — Other Ambulatory Visit (INDEPENDENT_AMBULATORY_CARE_PROVIDER_SITE_OTHER): Payer: BC Managed Care – PPO

## 2013-07-25 DIAGNOSIS — E669 Obesity, unspecified: Secondary | ICD-10-CM

## 2013-07-25 DIAGNOSIS — E785 Hyperlipidemia, unspecified: Secondary | ICD-10-CM

## 2013-07-25 DIAGNOSIS — I10 Essential (primary) hypertension: Secondary | ICD-10-CM

## 2013-07-25 LAB — HEMOGLOBIN A1C
Hgb A1c MFr Bld: 5.5 % (ref <5.7)
Mean Plasma Glucose: 111 mg/dL (ref <117)

## 2013-07-25 LAB — COMPLETE METABOLIC PANEL WITH GFR
ALT: 20 U/L (ref 0–35)
Albumin: 4 g/dL (ref 3.5–5.2)
CO2: 26 mEq/L (ref 19–32)
Chloride: 105 mEq/L (ref 96–112)
GFR, Est African American: >89 mL/min
GFR, Est Non African American: >89 mL/min
Glucose, Bld: 83 mg/dL (ref 70–99)
Potassium: 4.2 mEq/L (ref 3.5–5.3)
Sodium: 140 mEq/L (ref 135–145)
Total Protein: 6.8 g/dL (ref 6.0–8.3)

## 2013-07-25 LAB — LIPID PANEL
Cholesterol: 150 mg/dL (ref 0–200)
HDL: 38 mg/dL — ABNORMAL LOW
LDL Cholesterol: 73 mg/dL (ref 0–99)
Total CHOL/HDL Ratio: 3.9 ratio
Triglycerides: 195 mg/dL — ABNORMAL HIGH
VLDL: 39 mg/dL (ref 0–40)

## 2013-07-25 LAB — TSH: TSH: 0.885 u[IU]/mL (ref 0.350–4.500)

## 2013-07-25 LAB — T4, FREE: Free T4: 1.24 ng/dL (ref 0.80–1.80)

## 2013-07-25 NOTE — Addendum Note (Signed)
Addended by: Lyn Henri C on: 07/25/2013 10:08 AM   Modules accepted: Orders

## 2013-07-25 NOTE — Addendum Note (Signed)
Addended by: Lyn Henri C on: 07/25/2013 10:24 AM   Modules accepted: Orders

## 2013-07-25 NOTE — Addendum Note (Signed)
Addended by: Lyn Henri C on: 07/25/2013 10:27 AM   Modules accepted: Orders

## 2013-07-26 LAB — MICROALBUMIN, URINE: Microalb, Ur: 0.74 mg/dL (ref 0.00–1.89)

## 2013-12-06 ENCOUNTER — Other Ambulatory Visit: Payer: Self-pay | Admitting: Adult Health

## 2014-02-05 ENCOUNTER — Other Ambulatory Visit (INDEPENDENT_AMBULATORY_CARE_PROVIDER_SITE_OTHER): Payer: BC Managed Care – PPO

## 2014-02-05 DIAGNOSIS — E119 Type 2 diabetes mellitus without complications: Secondary | ICD-10-CM

## 2014-02-05 DIAGNOSIS — E559 Vitamin D deficiency, unspecified: Secondary | ICD-10-CM

## 2014-02-06 LAB — CMP14+EGFR
ALBUMIN: 3.8 g/dL (ref 3.5–5.5)
ALK PHOS: 27 IU/L — AB (ref 39–117)
ALT: 13 IU/L (ref 0–32)
AST: 12 IU/L (ref 0–40)
Albumin/Globulin Ratio: 1.7 (ref 1.1–2.5)
BUN / CREAT RATIO: 17 (ref 9–23)
BUN: 12 mg/dL (ref 6–24)
CO2: 23 mmol/L (ref 18–29)
CREATININE: 0.71 mg/dL (ref 0.57–1.00)
Calcium: 9.4 mg/dL (ref 8.7–10.2)
Chloride: 104 mmol/L (ref 97–108)
GFR calc Af Amer: 121 mL/min/{1.73_m2} (ref >59)
GFR calc non Af Amer: 105 mL/min/{1.73_m2} (ref >59)
GLOBULIN, TOTAL: 2.2 g/dL (ref 1.5–4.5)
Glucose: 81 mg/dL (ref 65–99)
Potassium: 4.1 mmol/L (ref 3.5–5.2)
Sodium: 144 mmol/L (ref 134–144)
Total Bilirubin: 0.2 mg/dL (ref 0.0–1.2)
Total Protein: 6 g/dL (ref 6.0–8.5)

## 2014-02-06 LAB — LIPID PANEL
CHOL/HDL RATIO: 3.2 ratio (ref 0.0–4.4)
CHOLESTEROL TOTAL: 135 mg/dL (ref 100–199)
HDL: 42 mg/dL (ref >39)
LDL Calculated: 75 mg/dL (ref 0–99)
TRIGLYCERIDES: 91 mg/dL (ref 0–149)
VLDL Cholesterol Cal: 18 mg/dL (ref 5–40)

## 2014-02-06 LAB — HEMOGLOBIN A1C
ESTIMATED AVERAGE GLUCOSE: 105 mg/dL
HEMOGLOBIN A1C: 5.3 % (ref 4.8–5.6)

## 2014-02-06 LAB — VITAMIN D 25 HYDROXY (VIT D DEFICIENCY, FRACTURES): Vit D, 25-Hydroxy: 33.7 ng/mL (ref 30.0–100.0)

## 2014-03-01 ENCOUNTER — Other Ambulatory Visit: Payer: Self-pay | Admitting: Adult Health

## 2014-04-04 ENCOUNTER — Other Ambulatory Visit: Payer: Self-pay

## 2014-04-04 DIAGNOSIS — Z1231 Encounter for screening mammogram for malignant neoplasm of breast: Secondary | ICD-10-CM

## 2014-04-29 ENCOUNTER — Ambulatory Visit: Payer: BC Managed Care – PPO

## 2014-05-02 ENCOUNTER — Ambulatory Visit
Admission: RE | Admit: 2014-05-02 | Discharge: 2014-05-02 | Disposition: A | Discharge status: Home / Self Care | Payer: BC Managed Care – PPO | Source: Ambulatory Visit

## 2014-05-02 DIAGNOSIS — Z1231 Encounter for screening mammogram for malignant neoplasm of breast: Secondary | ICD-10-CM

## 2014-05-09 ENCOUNTER — Telehealth: Payer: Self-pay | Admitting: Nurse Practitioner

## 2014-05-09 NOTE — Telephone Encounter (Signed)
Discussed normal results with patient.

## 2014-05-12 DIAGNOSIS — I83813 Varicose veins of bilateral lower extremities with pain: Secondary | ICD-10-CM | POA: Insufficient documentation

## 2014-05-22 ENCOUNTER — Other Ambulatory Visit: Payer: Self-pay | Admitting: Adult Health

## 2014-05-29 ENCOUNTER — Other Ambulatory Visit: Payer: Self-pay | Admitting: Specialist

## 2014-05-29 DIAGNOSIS — M5416 Radiculopathy, lumbar region: Secondary | ICD-10-CM

## 2014-06-03 ENCOUNTER — Ambulatory Visit
Admission: RE | Admit: 2014-06-03 | Discharge: 2014-06-03 | Disposition: A | Discharge status: Home / Self Care | Payer: BC Managed Care – PPO | Source: Ambulatory Visit | Attending: Specialist | Admitting: Specialist

## 2014-06-03 DIAGNOSIS — M5416 Radiculopathy, lumbar region: Secondary | ICD-10-CM

## 2014-06-25 ENCOUNTER — Other Ambulatory Visit: Payer: BC Managed Care – PPO

## 2014-06-26 ENCOUNTER — Other Ambulatory Visit (INDEPENDENT_AMBULATORY_CARE_PROVIDER_SITE_OTHER): Payer: BC Managed Care – PPO

## 2014-06-26 DIAGNOSIS — E119 Type 2 diabetes mellitus without complications: Secondary | ICD-10-CM

## 2014-06-26 DIAGNOSIS — E7849 Other hyperlipidemia: Secondary | ICD-10-CM

## 2014-06-26 DIAGNOSIS — I10 Essential (primary) hypertension: Secondary | ICD-10-CM

## 2014-06-26 DIAGNOSIS — E669 Obesity, unspecified: Secondary | ICD-10-CM

## 2014-06-26 DIAGNOSIS — E784 Other hyperlipidemia: Secondary | ICD-10-CM

## 2014-06-27 LAB — LIPID PANEL
CHOLESTEROL TOTAL: 169 mg/dL (ref 100–199)
Chol/HDL Ratio: 2.7 ratio units (ref 0.0–4.4)
HDL: 63 mg/dL (ref >39)
LDL CALC: 92 mg/dL (ref 0–99)
TRIGLYCERIDES: 70 mg/dL (ref 0–149)
VLDL CHOLESTEROL CAL: 14 mg/dL (ref 5–40)

## 2014-06-27 LAB — CMP14+EGFR
A/G RATIO: 1.7 (ref 1.1–2.5)
ALBUMIN: 3.8 g/dL (ref 3.5–5.5)
ALT: 15 IU/L (ref 0–32)
AST: 10 IU/L (ref 0–40)
Alkaline Phosphatase: 29 IU/L — ABNORMAL LOW (ref 39–117)
BUN/Creatinine Ratio: 12 (ref 9–23)
BUN: 9 mg/dL (ref 6–24)
CALCIUM: 9.2 mg/dL (ref 8.7–10.2)
CO2: 25 mmol/L (ref 18–29)
CREATININE: 0.75 mg/dL (ref 0.57–1.00)
Chloride: 104 mmol/L (ref 97–108)
GFR calc Af Amer: 113 mL/min/{1.73_m2} (ref >59)
GFR, EST NON AFRICAN AMERICAN: 98 mL/min/{1.73_m2} (ref >59)
GLOBULIN, TOTAL: 2.3 g/dL (ref 1.5–4.5)
GLUCOSE: 77 mg/dL (ref 65–99)
Potassium: 4.3 mmol/L (ref 3.5–5.2)
SODIUM: 143 mmol/L (ref 134–144)
TOTAL PROTEIN: 6.1 g/dL (ref 6.0–8.5)
Total Bilirubin: 0.2 mg/dL (ref 0.0–1.2)

## 2014-06-27 LAB — HEMOGLOBIN A1C
ESTIMATED AVERAGE GLUCOSE: 114 mg/dL
Hgb A1c MFr Bld: 5.6 % (ref 4.8–5.6)

## 2014-06-27 LAB — T4, FREE: Free T4: 1.34 ng/dL (ref 0.82–1.77)

## 2014-06-27 LAB — TSH: TSH: 2.37 u[IU]/mL (ref 0.450–4.500)

## 2014-07-02 ENCOUNTER — Telehealth: Payer: Self-pay | Admitting: Family Medicine

## 2014-07-02 NOTE — Telephone Encounter (Signed)
LM.   Please call  Loni Beckwith MD.  This is the provider that requested these labs.  Call back if any problems.

## 2014-07-02 NOTE — Telephone Encounter (Signed)
LM for patient to call Dr. Dorris Fetch, endocrinology for results.  Message sent to Dr. Dorris Fetch advising results are back.

## 2014-09-24 ENCOUNTER — Other Ambulatory Visit: Payer: Self-pay | Admitting: *Deleted

## 2014-09-24 MED ORDER — FENOFIBRIC ACID 135 MG PO CPDR: DELAYED_RELEASE_CAPSULE | ORAL | Status: DC

## 2014-10-28 DIAGNOSIS — Z029 Encounter for administrative examinations, unspecified: Secondary | ICD-10-CM

## 2014-11-12 ENCOUNTER — Ambulatory Visit (INDEPENDENT_AMBULATORY_CARE_PROVIDER_SITE_OTHER): Payer: BLUE CROSS/BLUE SHIELD | Admitting: Family

## 2014-11-12 ENCOUNTER — Encounter: Payer: Self-pay | Admitting: Family

## 2014-11-12 VITALS — BP 126/88 | HR 97 | Temp 97.2°F | Ht 68.0 in | Wt 195.2 lb

## 2014-11-12 DIAGNOSIS — M5136 Other intervertebral disc degeneration, lumbar region: Secondary | ICD-10-CM

## 2014-11-12 DIAGNOSIS — G629 Polyneuropathy, unspecified: Secondary | ICD-10-CM

## 2014-11-12 DIAGNOSIS — M549 Dorsalgia, unspecified: Secondary | ICD-10-CM

## 2014-11-12 DIAGNOSIS — M542 Cervicalgia: Secondary | ICD-10-CM

## 2014-11-12 DIAGNOSIS — G8929 Other chronic pain: Secondary | ICD-10-CM

## 2014-11-12 DIAGNOSIS — E781 Pure hyperglyceridemia: Secondary | ICD-10-CM | POA: Diagnosis not present

## 2014-11-12 MED ORDER — MOMETASONE FUROATE 0.1 % EX SOLN: Freq: Every day | CUTANEOUS | Status: DC

## 2014-11-12 MED ORDER — MOMETASONE FUROATE 50 MCG/ACT NA SUSP: 2.0000 | Freq: Every day | NASAL | Status: DC

## 2014-11-12 NOTE — Progress Notes (Signed)
   Subjective:    Patient ID: Aimee [name], female    DOB: 1971-02-10, 44 y.o.   MRN: [id number]  HPI Pt presents to the office to have paper work filled out. Pt states she see's a neurologists and a pain clinic. Neurologists for chronic back pain and neck pain. Pt denies any headache, palpitations, SOB, or edema at this time.     Review of Systems  Constitutional: Negative.   HENT: Negative.   Eyes: Negative.   Respiratory: Negative.  Negative for shortness of breath.   Cardiovascular: Negative.  Negative for palpitations.  Gastrointestinal: Negative.   Endocrine: Negative.   Genitourinary: Negative.   Musculoskeletal: Negative.   Neurological: Negative.  Negative for headaches.  Hematological: Negative.   Psychiatric/Behavioral: Negative.   All other systems reviewed and are negative.      Objective:   Physical Exam  Constitutional: She is oriented to person, place, and time. She appears well-developed and well-nourished. No distress.  HENT:  Head: Normocephalic and atraumatic.  Right Ear: External ear normal.  Mouth/Throat: Oropharynx is clear and moist.  Eyes: Pupils are equal, round, and reactive to light.  Neck: Normal range of motion. Neck supple. No thyromegaly present.  Cardiovascular: Normal rate, regular rhythm, normal heart sounds and intact distal pulses.   No murmur heard. Pulmonary/Chest: Effort normal and breath sounds normal. No respiratory distress. She has no wheezes.  Abdominal: Soft. Bowel sounds are normal. She exhibits no distension. There is no tenderness.  Musculoskeletal: Normal range of motion. She exhibits no edema or tenderness.  Neurological: She is alert and oriented to person, place, and time. She has normal reflexes. No cranial nerve deficit.  Skin: Skin is warm and dry.  Psychiatric: She has a normal mood and affect. Her behavior is normal. Judgment and thought content normal.  Vitals reviewed.   BP 126/88 mmHg  Pulse 97  Temp(Src)  97.2 F (36.2 C) (Oral)  Ht 5' 8"  (1.727 m)  Wt 195 lb 3.2 oz (88.542 kg)  BMI 29.69 kg/m2       Assessment & Plan:  1. Hypertriglyceridemia  2. Peripheral neuropathy  3. Chronic back pain  4. Neck pain  5. DDD (degenerative disc disease), lumbar    Continue all meds Keep appointments with pain clinic and neurologists Chart faxed from other providers to fill out FMLA forms Diet and exercise encouraged RTO as needed  Evelina Dun, FNP

## 2014-11-12 NOTE — Patient Instructions (Signed)

## 2014-12-10 ENCOUNTER — Other Ambulatory Visit: Payer: Self-pay | Admitting: Physician Assistant

## 2015-01-21 ENCOUNTER — Other Ambulatory Visit (INDEPENDENT_AMBULATORY_CARE_PROVIDER_SITE_OTHER): Payer: BLUE CROSS/BLUE SHIELD

## 2015-01-21 DIAGNOSIS — E1165 Type 2 diabetes mellitus with hyperglycemia: Secondary | ICD-10-CM

## 2015-01-21 NOTE — Progress Notes (Signed)
Lab only for Dr Loni Beckwith

## 2015-01-22 LAB — COMPREHENSIVE METABOLIC PANEL
A/G RATIO: 1.4 (ref 1.1–2.5)
ALBUMIN: 4 g/dL (ref 3.5–5.5)
ALT: 28 IU/L (ref 0–32)
AST: 21 IU/L (ref 0–40)
Alkaline Phosphatase: 29 IU/L — ABNORMAL LOW (ref 39–117)
BUN/Creatinine Ratio: 14 (ref 9–23)
BUN: 12 mg/dL (ref 6–24)
Bilirubin Total: 0.2 mg/dL (ref 0.0–1.2)
CO2: 24 mmol/L (ref 18–29)
Calcium: 9.3 mg/dL (ref 8.7–10.2)
Chloride: 104 mmol/L (ref 97–108)
Creatinine, Ser: 0.87 mg/dL (ref 0.57–1.00)
GFR calc Af Amer: 94 mL/min/{1.73_m2} (ref >59)
GFR, EST NON AFRICAN AMERICAN: 81 mL/min/{1.73_m2} (ref >59)
GLOBULIN, TOTAL: 2.8 g/dL (ref 1.5–4.5)
Glucose: 85 mg/dL (ref 65–99)
Potassium: 4.1 mmol/L (ref 3.5–5.2)
Sodium: 142 mmol/L (ref 134–144)
Total Protein: 6.8 g/dL (ref 6.0–8.5)

## 2015-01-22 LAB — LIPID PANEL W/O CHOL/HDL RATIO
CHOLESTEROL TOTAL: 165 mg/dL (ref 100–199)
HDL: 49 mg/dL (ref >39)
LDL CALC: 93 mg/dL (ref 0–99)
Triglycerides: 117 mg/dL (ref 0–149)
VLDL CHOLESTEROL CAL: 23 mg/dL (ref 5–40)

## 2015-01-22 LAB — VITAMIN D 25 HYDROXY (VIT D DEFICIENCY, FRACTURES): VIT D 25 HYDROXY: 26.5 ng/mL — AB (ref 30.0–100.0)

## 2015-01-22 LAB — HGB A1C W/O EAG: HEMOGLOBIN A1C: 5.7 % — AB (ref 4.8–5.6)

## 2015-01-22 LAB — VITAMIN B12: VITAMIN B 12: 1215 pg/mL — AB (ref 211–946)

## 2015-02-02 ENCOUNTER — Ambulatory Visit (INDEPENDENT_AMBULATORY_CARE_PROVIDER_SITE_OTHER): Payer: BLUE CROSS/BLUE SHIELD | Admitting: Family

## 2015-02-02 ENCOUNTER — Telehealth: Payer: Self-pay | Admitting: Family Medicine

## 2015-02-02 ENCOUNTER — Encounter: Payer: Self-pay | Admitting: Family

## 2015-02-02 VITALS — BP 159/99 | HR 82 | Temp 97.3°F | Resp 16 | Wt 197.4 lb

## 2015-02-02 DIAGNOSIS — J209 Acute bronchitis, unspecified: Secondary | ICD-10-CM | POA: Diagnosis not present

## 2015-02-02 DIAGNOSIS — R079 Chest pain, unspecified: Secondary | ICD-10-CM

## 2015-02-02 MED ORDER — BENZONATATE 200 MG PO CAPS: 200.0000 mg | ORAL_CAPSULE | Freq: Three times a day (TID) | ORAL | Status: DC | PRN

## 2015-02-02 MED ORDER — HYDROCODONE-HOMATROPINE 5-1.5 MG/5ML PO SYRP: 5.0000 mL | ORAL_SOLUTION | Freq: Three times a day (TID) | ORAL | Status: DC | PRN

## 2015-02-02 MED ORDER — ALBUTEROL SULFATE HFA 108 (90 BASE) MCG/ACT IN AERS: 2.0000 | INHALATION_SPRAY | Freq: Four times a day (QID) | RESPIRATORY_TRACT | Status: DC | PRN

## 2015-02-02 MED ORDER — FLUCONAZOLE 150 MG PO TABS: 150.0000 mg | ORAL_TABLET | Freq: Once | ORAL | Status: DC

## 2015-02-02 MED ORDER — AMOXICILLIN-POT CLAVULANATE 875-125 MG PO TABS: 1.0000 | ORAL_TABLET | Freq: Two times a day (BID) | ORAL | Status: DC

## 2015-02-02 MED ORDER — METHYLPREDNISOLONE ACETATE 80 MG/ML IJ SUSP
80.0000 mg | Freq: Once | INTRAMUSCULAR | Status: AC
  Administered 2015-02-02: 80 mg via INTRAMUSCULAR

## 2015-02-02 MED ORDER — METHYLPREDNISOLONE 4 MG PO TBPK: ORAL_TABLET | ORAL | Status: DC

## 2015-02-02 NOTE — Patient Instructions (Addendum)
Acute Bronchitis Bronchitis is inflammation of the airways that extend from the windpipe into the lungs (bronchi). The inflammation often causes mucus to develop. This leads to a cough, which is the most common symptom of bronchitis.  In acute bronchitis, the condition usually develops suddenly and goes away over time, usually in a couple weeks. Smoking, allergies, and asthma can make bronchitis worse. Repeated episodes of bronchitis may cause further lung problems.  CAUSES Acute bronchitis is most often caused by the same virus that causes a cold. The virus can spread from person to person (contagious) through coughing, sneezing, and touching contaminated objects. SIGNS AND SYMPTOMS   Cough.   Fever.   Coughing up mucus.   Body aches.   Chest congestion.   Chills.   Shortness of breath.   Sore throat.  DIAGNOSIS  Acute bronchitis is usually diagnosed through a physical exam. Your health care provider will also ask you questions about your medical history. Tests, such as chest X-rays, are sometimes done to rule out other conditions.  TREATMENT  Acute bronchitis usually goes away in a couple weeks. Oftentimes, no medical treatment is necessary. Medicines are sometimes given for relief of fever or cough. Antibiotic medicines are usually not needed but may be prescribed in certain situations. In some cases, an inhaler may be recommended to help reduce shortness of breath and control the cough. A cool mist vaporizer may also be used to help thin bronchial secretions and make it easier to clear the chest.  HOME CARE INSTRUCTIONS  Get plenty of rest.   Drink enough fluids to keep your urine clear or pale yellow (unless you have a medical condition that requires fluid restriction). Increasing fluids may help thin your respiratory secretions (sputum) and reduce chest congestion, and it will prevent dehydration.   Take medicines only as directed by your health care provider.  If  you were prescribed an antibiotic medicine, finish it all even if you start to feel better.  Avoid smoking and secondhand smoke. Exposure to cigarette smoke or irritating chemicals will make bronchitis worse. If you are a smoker, consider using nicotine gum or skin patches to help control withdrawal symptoms. Quitting smoking will help your lungs heal faster.   Reduce the chances of another bout of acute bronchitis by washing your hands frequently, avoiding people with cold symptoms, and trying not to touch your hands to your mouth, nose, or eyes.   Keep all follow-up visits as directed by your health care provider.  SEEK MEDICAL CARE IF: Your symptoms do not improve after 1 week of treatment.  SEEK IMMEDIATE MEDICAL CARE IF:  You develop an increased fever or chills.   You have chest pain.   You have severe shortness of breath.  You have bloody sputum.   You develop dehydration.  You faint or repeatedly feel like you are going to pass out.  You develop repeated vomiting.  You develop a severe headache. MAKE SURE YOU:   Understand these instructions.  Will watch your condition.  Will get help right away if you are not doing well or get worse. Document Released: 10/06/2004 Document Revised: 01/13/2014 Document Reviewed: 02/19/2013 Exit[employer] Patient Information 2015 [employer]. This information is not intended to replace advice given to you by your health care provider. Make sure you discuss any questions you have with your health care provider.  - Take meds as prescribed - Use a cool mist humidifier  -Use saline nose sprays frequently -Saline irrigations of the nose can   be very helpful if done frequently.  * 4X daily for 1 week*  * Use of a nettie pot can be helpful with this. Follow directions with this* -Force fluids -For any cough or congestion  Use plain Mucinex- regular strength or max strength is fine   * Children- consult with Pharmacist for dosing -For  fever or aces or pains- take tylenol or ibuprofen appropriate for age and weight.  * for fevers greater than 101 orally you may alternate ibuprofen and tylenol every  3 hours. -Throat lozenges if help    Montrez Marietta, FNP  

## 2015-02-02 NOTE — Progress Notes (Signed)
Subjective:    Patient ID: Aimee [name], female    DOB: August 26, 1971, 44 y.o.   MRN: [id number]  Cough This is a new problem. The current episode started in the past 7 days ("two or three days ago"). The problem has been gradually worsening. The problem occurs every few minutes. The cough is non-productive. Associated symptoms include chest pain, chills, ear congestion, headaches, myalgias, nasal congestion, postnasal drip, rhinorrhea, a sore throat, shortness of breath and wheezing. Pertinent negatives include no ear pain or fever. The symptoms are aggravated by lying down. Risk factors for lung disease include smoking/tobacco exposure. She has tried rest (Thermiflu) for the symptoms. The treatment provided mild relief. Her past medical history is significant for COPD. There is no history of asthma.  Chest Pain  This is a new problem. The current episode started yesterday. The onset quality is sudden. The problem occurs constantly. The pain is present in the lateral region. The pain is at a severity of 8/10. The pain is moderate. The quality of the pain is described as heavy and pressure. The pain radiates to the left arm. Associated symptoms include a cough, headaches, nausea, shortness of breath and vomiting. Pertinent negatives include no back pain, dizziness, fever or palpitations. The pain is aggravated by coughing and movement. She has tried rest for the symptoms. The treatment provided no relief.      Review of Systems  Constitutional: Positive for chills. Negative for fever.  HENT: Positive for postnasal drip, rhinorrhea and sore throat. Negative for ear pain.   Eyes: Negative.   Respiratory: Positive for cough, shortness of breath and wheezing.   Cardiovascular: Positive for chest pain. Negative for palpitations.  Gastrointestinal: Positive for nausea and vomiting.  Endocrine: Negative.   Genitourinary: Negative.   Musculoskeletal: Positive for myalgias. Negative for back pain.    Neurological: Positive for headaches. Negative for dizziness.  Hematological: Negative.   Psychiatric/Behavioral: Negative.   All other systems reviewed and are negative.      Objective:   Physical Exam  Constitutional: She is oriented to person, place, and time. She appears well-developed and well-nourished. No distress.  HENT:  Head: Normocephalic and atraumatic.  Right Ear: External ear normal.  Left Ear: External ear normal.  Nasal passage erythemas with mild swelling  Oropharynx erythemas   Eyes: Pupils are equal, round, and reactive to light.  Neck: Normal range of motion. Neck supple. No thyromegaly present.  Cardiovascular: Normal rate, regular rhythm, normal heart sounds and intact distal pulses.   No murmur heard. Pulmonary/Chest: Effort normal. No respiratory distress. She has wheezes.  Abdominal: Soft. Bowel sounds are normal. She exhibits no distension. There is no tenderness.  Musculoskeletal: Normal range of motion. She exhibits no edema or tenderness.  Neurological: She is alert and oriented to person, place, and time. She has normal reflexes. No cranial nerve deficit.  Skin: Skin is warm and dry.  Psychiatric: She has a normal mood and affect. Her behavior is normal. Judgment and thought content normal.  Vitals reviewed.   BP 159/99 mmHg  Pulse 82  Temp(Src) 97.3 F (36.3 C) (Oral)  Resp 16  Wt 197 lb 6.4 oz (89.54 kg)  EKG- WNL     Assessment & Plan:  1. Acute bronchitis, unspecified organism -- Take meds as prescribed - Use a cool mist humidifier  -Use saline nose sprays frequently -Saline irrigations of the nose can be very helpful if done frequently.  * 4X daily for 1 week*  * Use  of a nettie pot can be helpful with this. Follow directions with this* -Force fluids -For any cough or congestion  Use plain Mucinex- regular strength or max strength is fine   * Children- consult with Pharmacist for dosing -For fever or aces or pains- take tylenol  or ibuprofen appropriate for age and weight.  * for fevers greater than 101 orally you may alternate ibuprofen and tylenol every  3 hours. -Throat lozenges if help -Augmentin for 7 days - methylPREDNISolone acetate (DEPO-MEDROL) injection 80 mg; Inject 1 mL (80 mg total) into the muscle once. - benzonatate (TESSALON) 200 MG capsule; Take 1 capsule (200 mg total) by mouth 3 (three) times daily as needed.  Dispense: 30 capsule; Refill: 1 - methylPREDNISolone (MEDROL DOSEPAK) 4 MG TBPK tablet; Use as directed  Dispense: 21 tablet; Refill: 0 - albuterol (PROVENTIL HFA;VENTOLIN HFA) 108 (90 BASE) MCG/ACT inhaler; Inhale 2 puffs into the lungs every 6 (six) hours as needed for wheezing or shortness of breath.  Dispense: 1 Inhaler; Refill: 2 - HYDROcodone-homatropine (HYCODAN) 5-1.5 MG/5ML syrup; Take 5 mLs by mouth every 8 (eight) hours as needed for cough.  Dispense: 120 mL; Refill: 0  2. Chest pain, unspecified chest pain type - methylPREDNISolone acetate (DEPO-MEDROL) injection 80 mg; Inject 1 mL (80 mg total) into the muscle once. - EKG 12-Lead; Standing - EKG 12-Lead  Evelina Dun, FNP

## 2015-02-02 NOTE — Telephone Encounter (Signed)
Stp and she has appt with Aimee [name] today at 4:10 due to another pt having cancelled their appt. Pt states one of her daughters will be bringing her to her appt.

## 2015-02-19 ENCOUNTER — Encounter: Payer: Self-pay | Admitting: Family

## 2015-02-19 ENCOUNTER — Other Ambulatory Visit: Payer: Self-pay | Admitting: Family

## 2015-02-26 ENCOUNTER — Ambulatory Visit (INDEPENDENT_AMBULATORY_CARE_PROVIDER_SITE_OTHER): Payer: BLUE CROSS/BLUE SHIELD | Admitting: Family Medicine

## 2015-02-26 ENCOUNTER — Ambulatory Visit (INDEPENDENT_AMBULATORY_CARE_PROVIDER_SITE_OTHER): Payer: BLUE CROSS/BLUE SHIELD

## 2015-02-26 ENCOUNTER — Encounter: Payer: Self-pay | Admitting: Family Medicine

## 2015-02-26 VITALS — BP 139/95 | HR 85 | Temp 98.4°F | Ht 68.0 in | Wt 194.8 lb

## 2015-02-26 DIAGNOSIS — R0600 Dyspnea, unspecified: Secondary | ICD-10-CM

## 2015-02-26 DIAGNOSIS — Z01419 Encounter for gynecological examination (general) (routine) without abnormal findings: Secondary | ICD-10-CM

## 2015-02-26 DIAGNOSIS — Z1212 Encounter for screening for malignant neoplasm of rectum: Secondary | ICD-10-CM | POA: Diagnosis not present

## 2015-02-26 DIAGNOSIS — F17200 Nicotine dependence, unspecified, uncomplicated: Secondary | ICD-10-CM

## 2015-02-26 DIAGNOSIS — Z720 Tobacco use: Secondary | ICD-10-CM

## 2015-02-26 MED ORDER — CIPROFLOXACIN HCL 500 MG PO TABS: 500.0000 mg | ORAL_TABLET | Freq: Two times a day (BID) | ORAL | Status: DC

## 2015-02-26 MED ORDER — MOMETASONE FUROATE 0.1 % EX SOLN: Freq: Every day | CUTANEOUS | Status: DC

## 2015-02-26 MED ORDER — VARENICLINE TARTRATE 0.5 MG X 11 & 1 MG X 42 PO MISC: ORAL | Status: DC

## 2015-02-26 NOTE — Progress Notes (Signed)
Subjective:  Patient ID: Aimee [name], female    DOB: 24-Jun-1971  Age: 44 y.o. MRN: [id number]  CC: Gynecologic Exam   HPI Aimee [name] presents for patient had a hysterectomy several years ago. This was not done for purposes of cancer. Therefore she states she would prefer not to have a Pap smear. She is willing to have a breast and rectal exam done. She is having some right ear discomfort. Additionally she has put on some weight and has had some small boil-like lesions on the thighs recently.  History Aimee [name] has a past medical history of Diabetes mellitus and Hypertriglyceridemia.   She has past surgical history that includes Abdominoplasty (2006); Inner ear surgery; Thoracic disc surgery (2011, 2012); Abdominal hysterectomy (2010); and Cesarean section (1991, 1995).   Her family history includes Diabetes in her father; GI problems in her mother.She reports that she has been smoking Cigarettes.  She has been smoking about 1.00 pack per day. She does not have any smokeless tobacco history on file. She reports that she drinks alcohol. Her drug history is not on file.  Outpatient Prescriptions Prior to Visit  Medication Sig Dispense Refill  . B-D 3CC LUER-LOK SYR 25GX1" 25G X 1" 3 ML MISC   4  . Choline Fenofibrate (FENOFIBRIC ACID) 135 MG CPDR TAKE ONE  BY MOUTH ONCE DAILY 30 capsule 4  . cyanocobalamin (,VITAMIN B-12,) 1000 MCG/ML injection   5  . cyclobenzaprine (FLEXERIL) 10 MG tablet Take 10 mg by mouth 3 (three) times daily.    Marland Kitchen gabapentin (NEURONTIN) 300 MG capsule 1st week: Take 1 tablet AM, 1 PM and 2 qhs. 2nd wek & on: take 1 AM, 1 Pm and 3 qhs    . mometasone (NASONEX) 50 MCG/ACT nasal spray Place 2 sprays into the nose daily. 17 g 12  . omega-3 acid ethyl esters (LOVAZA) 1 G capsule     . QSYMIA 15-92 MG CP24   3  . simvastatin (ZOCOR) 20 MG tablet Take 20 mg by mouth daily.    . tapentadol (NUCYNTA) 50 MG TABS tablet Take 50 mg by mouth.    . Tapentadol HCl 250 MG TB12  Take 250 mg by mouth.    . mometasone (ELOCON) 0.1 % lotion Apply topically daily. 60 mL 0  . albuterol (PROVENTIL HFA;VENTOLIN HFA) 108 (90 BASE) MCG/ACT inhaler Inhale 2 puffs into the lungs every 6 (six) hours as needed for wheezing or shortness of breath. (Patient not taking: Reported on 02/26/2015) 1 Inhaler 2  . amoxicillin-clavulanate (AUGMENTIN) 875-125 MG per tablet Take 1 tablet by mouth 2 (two) times daily. 14 tablet 0  . benzonatate (TESSALON) 200 MG capsule Take 1 capsule (200 mg total) by mouth 3 (three) times daily as needed. 30 capsule 1  . fluconazole (DIFLUCAN) 150 MG tablet Take 1 tablet (150 mg total) by mouth once. 1 tablet 0  . HYDROcodone-homatropine (HYCODAN) 5-1.5 MG/5ML syrup Take 5 mLs by mouth every 8 (eight) hours as needed for cough. 120 mL 0  . methylPREDNISolone (MEDROL DOSEPAK) 4 MG TBPK tablet Use as directed 21 tablet 0  . omeprazole (PRILOSEC) 20 MG capsule Take 1 capsule (20 mg total) by mouth daily. 30 capsule 5   No facility-administered medications prior to visit.    ROS Review of Systems  Constitutional: Negative for fever, chills, diaphoresis, appetite change, fatigue and unexpected weight change.  HENT: Positive for ear pain (on the right). Negative for congestion, hearing loss, postnasal drip, rhinorrhea, sneezing, sore throat  and trouble swallowing.   Eyes: Negative for pain.  Respiratory: Negative for cough, chest tightness and shortness of breath.   Cardiovascular: Negative for chest pain and palpitations.  Gastrointestinal: Negative for nausea, vomiting, abdominal pain, diarrhea and constipation.  Genitourinary: Negative for dysuria, frequency and menstrual problem.  Musculoskeletal: Negative for joint swelling and arthralgias.  Skin: Negative for rash.  Neurological: Negative for dizziness, weakness, numbness and headaches.  Psychiatric/Behavioral: Negative for dysphoric mood and agitation.    Objective:  BP 139/95 mmHg  Pulse 85   Temp(Src) 98.4 F (36.9 C) (Oral)  Ht 5' 8"  (1.727 m)  Wt 194 lb 12.8 oz (88.361 kg)  BMI 29.63 kg/m2  BP Readings from Last 3 Encounters:  02/26/15 139/95  02/02/15 159/99  11/12/14 126/88    Wt Readings from Last 3 Encounters:  02/26/15 194 lb 12.8 oz (88.361 kg)  02/02/15 197 lb 6.4 oz (89.54 kg)  11/12/14 195 lb 3.2 oz (88.542 kg)     Physical Exam  Constitutional: She is oriented to person, place, and time. She appears well-developed and well-nourished. No distress.  HENT:  Head: Normocephalic and atraumatic.  Right Ear: External ear normal.  Left Ear: External ear normal.  Nose: Nose normal.  Mouth/Throat: Oropharynx is clear and moist.  Eyes: Conjunctivae and EOM are normal. Pupils are equal, round, and reactive to light.  Neck: Normal range of motion. Neck supple. No thyromegaly present.  Cardiovascular: Normal rate, regular rhythm and normal heart sounds.   No murmur heard. Pulmonary/Chest: Effort normal and breath sounds normal. No respiratory distress. She has no wheezes. She has no rales.  Abdominal: Soft. Bowel sounds are normal. She exhibits no distension. There is no tenderness.  Genitourinary: No breast swelling, tenderness (no masses) or discharge. Pelvic exam was performed with patient supine.  Lymphadenopathy:    She has no cervical adenopathy.  Neurological: She is alert and oriented to person, place, and time. She has normal reflexes.  Skin: Skin is warm and dry. No rash noted. No erythema.  There are approximately 4-5 erythematous papules measuring 3-5 mm each on the proximal medial thighs and one at the buttocks near the rectum.  Psychiatric: She has a normal mood and affect. Her behavior is normal. Judgment and thought content normal.    Lab Results  Component Value Date   HGBA1C 5.7* 01/21/2015   HGBA1C 5.6 06/26/2014   HGBA1C 5.3 02/05/2014    Lab Results  Component Value Date   WBC 11.6* 09/16/2009   HGB 15.1* 09/16/2009   HCT 43.0  09/16/2009   PLT 244 09/16/2009   GLUCOSE 85 01/21/2015   CHOL 165 01/21/2015   TRIG 117 01/21/2015   HDL 49 01/21/2015   LDLCALC 93 01/21/2015   ALT 28 01/21/2015   AST 21 01/21/2015   NA 142 01/21/2015   K 4.1 01/21/2015   CL 104 01/21/2015   CREATININE 0.87 01/21/2015   BUN 12 01/21/2015   CO2 24 01/21/2015   TSH 2.370 06/26/2014   HGBA1C 5.7* 01/21/2015   MICROALBUR 0.74 07/25/2013    Ct Lumbar Spine Wo Contrast  06/03/2014   CLINICAL DATA:  44 year old female with low back pain radiating to the right lower extremity. Bilateral lower extremity numbness and tingling. Initial encounter. History of cervical spine fusion.  EXAM: CT LUMBAR SPINE WITHOUT CONTRAST  TECHNIQUE: Multidetector CT imaging of the lumbar spine was performed without intravenous contrast administration. Multiplanar CT image reconstructions were also generated.  COMPARISON:  Lumbar CT myelogram 03/28/2013.  FINDINGS: Same numbering as on the comparison. Stable vertebral height and alignment. Stable bone mineralization. Visible sacrum and SI joints intact.  Stable and negative visualized non contrast abdominal viscera. Trace calcified atherosclerosis of the abdominal aorta. Normal appendix.  T12-L1: Moderate facet hypertrophy is stable.  No stenosis.  L1-L2: Moderate to severe facet hypertrophy is stable. Trace disc bulge. No significant stenosis.  L2-L3: Moderate facet hypertrophy and minor disc bulge appears stable. No stenosis.  L3-L4: Moderate to severe facet hypertrophy and mild circumferential disc bulge appears stable. No significant stenosis.  L4-L5: Severe facet hypertrophy greater on the right. Vacuum facet phenomena on that side is chronic. Left eccentric disc bulge and ligament flavum hypertrophy not significantly changed. No significant stenosis.  L5-S1: Mild to moderate facet hypertrophy greater on the left is stable. Minor disc bulge. Mild epidural lipomatosis. No stenosis.  IMPRESSION: 1. No acute findings in  the lumbar spine. 2. Dominant degenerative finding is widespread facet arthropathy, severe at the L4-L5 level and greater on the left. No significant progression since a 2014 CT lumbar myelogram.   Electronically Signed   By: Lars Pinks M.D.   On: 06/03/2014 17:12    Assessment & Plan:   Ascencion was seen today for gynecologic exam.  Diagnoses and all orders for this visit:  Encounter for routine gynecological examination Orders: -     Cancel: Pap IG w/ reflex to HPV when ASC-U -     Estrogens, Total -     FSH/LH  Smoking Orders: -     PR BREATHING CAPACITY TEST  Dyspnea Orders: -     PR BREATHING CAPACITY TEST -     DG Chest 2 View; Future  Screening for malignant neoplasm of the rectum Orders: -     Fecal occult blood, imunochemical  Other orders -     ciprofloxacin (CIPRO) 500 MG tablet; Take 1 tablet (500 mg total) by mouth 2 (two) times daily. -     mometasone (ELOCON) 0.1 % lotion; Apply topically daily. -     varenicline (CHANTIX STARTING MONTH PAK) 0.5 MG X 11 & 1 MG X 42 tablet; Take one 0.5 mg tablet by mouth once daily for 3 days, then increase to one 0.5 mg tablet twice daily for 4 days, then increase to one 1 mg tablet twice daily.   I have discontinued Ms. Faivre's omeprazole, benzonatate, methylPREDNISolone, HYDROcodone-homatropine, amoxicillin-clavulanate, and fluconazole. I am also having her start on ciprofloxacin and varenicline. Additionally, I am having her maintain her Fenofibric Acid, simvastatin, cyclobenzaprine, cyanocobalamin, QSYMIA, gabapentin, B-D 3CC LUER-LOK SYR 25GX1", omega-3 acid ethyl esters, mometasone, Tapentadol HCl, tapentadol, albuterol, and mometasone.  Meds ordered this encounter  Medications  . ciprofloxacin (CIPRO) 500 MG tablet    Sig: Take 1 tablet (500 mg total) by mouth 2 (two) times daily.    Dispense:  20 tablet    Refill:  0  . mometasone (ELOCON) 0.1 % lotion    Sig: Apply topically daily.    Dispense:  60 mL    Refill:  0   . varenicline (CHANTIX STARTING MONTH PAK) 0.5 MG X 11 & 1 MG X 42 tablet    Sig: Take one 0.5 mg tablet by mouth once daily for 3 days, then increase to one 0.5 mg tablet twice daily for 4 days, then increase to one 1 mg tablet twice daily.    Dispense:  53 tablet    Refill:  0     Follow-up: Return in about  6 months (around 08/28/2015).  Claretta Fraise, M.D.

## 2015-02-27 ENCOUNTER — Telehealth: Payer: Self-pay | Admitting: Family Medicine

## 2015-02-27 LAB — FECAL OCCULT BLOOD, IMMUNOCHEMICAL: Fecal Occult Bld: NEGATIVE

## 2015-02-27 NOTE — Telephone Encounter (Signed)
This was regarding a written prescription that was printed for patient - per Tye [place], she has spoken with patient.

## 2015-02-28 ENCOUNTER — Other Ambulatory Visit: Payer: Self-pay | Admitting: Adult Health

## 2015-02-28 LAB — FSH/LH
FSH: 12.9 m[IU]/mL
LH: 34 m[IU]/mL

## 2015-02-28 LAB — ESTROGENS, TOTAL: Estrogen: 214 pg/mL

## 2015-03-03 ENCOUNTER — Telehealth: Payer: Self-pay | Admitting: *Deleted

## 2015-03-03 NOTE — Telephone Encounter (Signed)
Left message for patient to return my call.

## 2015-03-03 NOTE — Telephone Encounter (Signed)
-----   Message from Claretta Fraise, MD sent at 03/02/2015  6:10 PM EDT ----- Aimee [name], Your hormone levels are normal. You are not in menopause Best Regards, Claretta Fraise, M.D.

## 2015-03-03 NOTE — Telephone Encounter (Signed)
Patient notified of lab results

## 2015-03-06 ENCOUNTER — Telehealth: Payer: Self-pay | Admitting: Family Medicine

## 2015-03-06 MED ORDER — MINOCYCLINE HCL 100 MG PO CAPS
100.0000 mg | ORAL_CAPSULE | Freq: Two times a day (BID) | ORAL | Status: DC
Start: 2015-03-06 — End: 2015-03-18

## 2015-03-06 NOTE — Telephone Encounter (Signed)
Pt given Cipro on 6/16 for ear infection and abscess. The ear feels like it has resolved but there hasn't been any change in the boil. She has about 2 doses of the Cipro left. What do you recommend?

## 2015-03-06 NOTE — Telephone Encounter (Signed)
Left message that minocycline was sent in to the pharmacy and that she should let us know if symptoms worsen or don't resolve.

## 2015-03-06 NOTE — Telephone Encounter (Signed)
Send in minocycline 100 mg. 1 by mouth twice a day for 14 days. Thank you. WS.

## 2015-03-11 ENCOUNTER — Encounter: Payer: Self-pay | Admitting: Family Medicine

## 2015-03-17 ENCOUNTER — Telehealth: Payer: Self-pay | Admitting: Family Medicine

## 2015-03-17 NOTE — Telephone Encounter (Signed)
Abscess has gotten a little smaller but no major improvement.  She is taking minocycline, applying warm compresses and soaking the area. Your first available appointment is on 7/8. If she needs to be seen can I double book you around 3 tomorrow? You have two 30 minute appointments back to back.  If she doesn't need to be seen what do you suggest?

## 2015-03-17 NOTE — Telephone Encounter (Signed)
Tell her to come now and I will work her in

## 2015-03-18 ENCOUNTER — Encounter: Payer: Self-pay | Admitting: Family Medicine

## 2015-03-18 ENCOUNTER — Ambulatory Visit (INDEPENDENT_AMBULATORY_CARE_PROVIDER_SITE_OTHER): Payer: BLUE CROSS/BLUE SHIELD | Admitting: Family Medicine

## 2015-03-18 VITALS — BP 121/86 | HR 90 | Temp 97.8°F | Ht 68.0 in | Wt 197.2 lb

## 2015-03-18 DIAGNOSIS — L03115 Cellulitis of right lower limb: Secondary | ICD-10-CM

## 2015-03-18 DIAGNOSIS — L089 Local infection of the skin and subcutaneous tissue, unspecified: Secondary | ICD-10-CM | POA: Diagnosis not present

## 2015-03-18 DIAGNOSIS — M549 Dorsalgia, unspecified: Secondary | ICD-10-CM

## 2015-03-18 DIAGNOSIS — G8929 Other chronic pain: Secondary | ICD-10-CM | POA: Diagnosis not present

## 2015-03-18 DIAGNOSIS — F32A Depression, unspecified: Secondary | ICD-10-CM

## 2015-03-18 DIAGNOSIS — F329 Major depressive disorder, single episode, unspecified: Secondary | ICD-10-CM | POA: Diagnosis not present

## 2015-03-18 DIAGNOSIS — B9689 Other specified bacterial agents as the cause of diseases classified elsewhere: Secondary | ICD-10-CM

## 2015-03-18 MED ORDER — GABAPENTIN 600 MG PO TABS: 1200.0000 mg | ORAL_TABLET | Freq: Two times a day (BID) | ORAL | Status: DC

## 2015-03-18 MED ORDER — DULOXETINE HCL 30 MG PO CPEP: 30.0000 mg | ORAL_CAPSULE | Freq: Every day | ORAL | Status: DC

## 2015-03-18 MED ORDER — SULFAMETHOXAZOLE-TRIMETHOPRIM 800-160 MG PO TABS: 1.0000 | ORAL_TABLET | Freq: Two times a day (BID) | ORAL | Status: DC

## 2015-03-18 NOTE — Telephone Encounter (Signed)
appt scheduled

## 2015-03-18 NOTE — Progress Notes (Signed)
Subjective:  Patient ID: Aimee [name], female    DOB: 10/28/1970  Age: 44 y.o. MRN: [id number]  CC: Cyst   HPI Aimee [name] presents for concerns for ongoing back pain. Apparently her back is just remained. She has had an ablation and is planning to more. Additionally there is a plan for implanting a spinal stimulator for pain relief. This has caused her to not be able to function normally. Her 60-year-old grandson prayed the other night that his grandmother would get over her back pain so she would be able to play with him again she feels that she is let her family down. Her PHQ9 depression screening is attached showing a total score of 18. She does feel that she would be better off dead because she is becoming a burden on her family. She is afraid they will get tired of taking care of her. She is tearful discussing these issues. Additionally she is still having some pain and slow healing from the cyst on her right inner thigh. She notes she was bitten by ticks on both breasts last month and they have not completely healed.   History Aimee [name] has a past medical history of Diabetes mellitus and Hypertriglyceridemia.   She has past surgical history that includes Abdominoplasty (2006); Inner ear surgery; Thoracic disc surgery (2011, 2012); Abdominal hysterectomy (2010); and Cesarean section (1991, 1995).   Her family history includes Diabetes in her father; GI problems in her mother.She reports that she has been smoking Cigarettes.  She has been smoking about 1.00 pack per day. She does not have any smokeless tobacco history on file. She reports that she drinks alcohol. Her drug history is not on file.  Outpatient Prescriptions Prior to Visit  Medication Sig Dispense Refill  . B-D 3CC LUER-LOK SYR 25GX1" 25G X 1" 3 ML MISC   4  . Choline Fenofibrate (FENOFIBRIC ACID) 135 MG CPDR TAKE 1 CAPSULE EVERY DAY 30 capsule 4  . cyanocobalamin (,VITAMIN B-12,) 1000 MCG/ML injection   5  .  cyclobenzaprine (FLEXERIL) 10 MG tablet Take 10 mg by mouth 3 (three) times daily.    . mometasone (ELOCON) 0.1 % lotion Apply topically daily. 60 mL 0  . mometasone (NASONEX) 50 MCG/ACT nasal spray Place 2 sprays into the nose daily. 17 g 12  . omega-3 acid ethyl esters (LOVAZA) 1 G capsule     . QSYMIA 15-92 MG CP24   3  . simvastatin (ZOCOR) 20 MG tablet Take 20 mg by mouth daily.    . varenicline (CHANTIX STARTING MONTH PAK) 0.5 MG X 11 & 1 MG X 42 tablet Take one 0.5 mg tablet by mouth once daily for 3 days, then increase to one 0.5 mg tablet twice daily for 4 days, then increase to one 1 mg tablet twice daily. 53 tablet 0  . gabapentin (NEURONTIN) 300 MG capsule 1st week: Take 1 tablet AM, 1 PM and 2 qhs. 2nd wek & on: take 1 AM, 1 Pm and 3 qhs    . albuterol (PROVENTIL HFA;VENTOLIN HFA) 108 (90 BASE) MCG/ACT inhaler Inhale 2 puffs into the lungs every 6 (six) hours as needed for wheezing or shortness of breath. (Patient not taking: Reported on 03/18/2015) 1 Inhaler 2  . ciprofloxacin (CIPRO) 500 MG tablet Take 1 tablet (500 mg total) by mouth 2 (two) times daily. (Patient not taking: Reported on 03/18/2015) 20 tablet 0  . minocycline (MINOCIN,DYNACIN) 100 MG capsule Take 1 capsule (100 mg total) by mouth 2 (two)  times daily. (Patient not taking: Reported on 03/18/2015) 28 capsule 0   No facility-administered medications prior to visit.    ROS Review of Systems  Constitutional: Negative for fever, chills, diaphoresis, appetite change, fatigue and unexpected weight change.  HENT: Negative for congestion, ear pain, hearing loss, postnasal drip, rhinorrhea, sneezing, sore throat and trouble swallowing.   Eyes: Negative for pain.  Respiratory: Negative for cough, chest tightness and shortness of breath.   Cardiovascular: Negative for chest pain and palpitations.  Gastrointestinal: Negative for nausea, vomiting, abdominal pain, diarrhea and constipation.  Genitourinary: Negative for dysuria,  frequency and menstrual problem.  Musculoskeletal: Positive for back pain and arthralgias. Negative for joint swelling.  Skin:       See history of present illness  Neurological: Negative for dizziness, weakness, numbness and headaches.  Hematological: Negative.   Psychiatric/Behavioral: Positive for sleep disturbance, dysphoric mood, decreased concentration and agitation. Negative for suicidal ideas and self-injury.    Objective:  BP 121/86 mmHg  Pulse 90  Temp(Src) 97.8 F (36.6 C) (Oral)  Ht 5' 8"  (1.727 m)  Wt 197 lb 3.2 oz (89.449 kg)  BMI 29.99 kg/m2  BP Readings from Last 3 Encounters:  03/18/15 121/86  02/26/15 139/95  02/02/15 159/99    Wt Readings from Last 3 Encounters:  03/18/15 197 lb 3.2 oz (89.449 kg)  02/26/15 194 lb 12.8 oz (88.361 kg)  02/02/15 197 lb 6.4 oz (89.54 kg)     Physical Exam  Constitutional: She is oriented to person, place, and time. She appears well-developed and well-nourished. No distress.  HENT:  Head: Normocephalic and atraumatic.  Right Ear: External ear normal.  Left Ear: External ear normal.  Nose: Nose normal.  Mouth/Throat: Oropharynx is clear and moist.  Eyes: Conjunctivae and EOM are normal. Pupils are equal, round, and reactive to light.  Neck: Normal range of motion. Neck supple. No thyromegaly present.  Cardiovascular: Normal rate, regular rhythm and normal heart sounds.   No murmur heard. Pulmonary/Chest: Effort normal and breath sounds normal. No respiratory distress. She has no wheezes. She has no rales.  Abdominal: Soft. Bowel sounds are normal. She exhibits no distension. There is no tenderness.  Lymphadenopathy:    She has no cervical adenopathy.  Neurological: She is alert and oriented to person, place, and time. She has normal reflexes.  Skin: Skin is warm and dry.  There are 3 lesions on the skin. Lesion on the right upper medial breast and the left lateral breast are hyperemic with fresh skin formation and no  fluctuance or induration.  1 x 3 cm area of new skin formation without induration but with central 4 mm scab noted at right inner thigh. No fluctuance.  Psychiatric: Thought content normal.  Tearful, sad, dysphoric    Lab Results  Component Value Date   HGBA1C 5.7* 01/21/2015   HGBA1C 5.6 06/26/2014   HGBA1C 5.3 02/05/2014    Lab Results  Component Value Date   WBC 11.6* 09/16/2009   HGB 15.1* 09/16/2009   HCT 43.0 09/16/2009   PLT 244 09/16/2009   GLUCOSE 85 01/21/2015   CHOL 165 01/21/2015   TRIG 117 01/21/2015   HDL 49 01/21/2015   LDLCALC 93 01/21/2015   ALT 28 01/21/2015   AST 21 01/21/2015   NA 142 01/21/2015   K 4.1 01/21/2015   CL 104 01/21/2015   CREATININE 0.87 01/21/2015   BUN 12 01/21/2015   CO2 24 01/21/2015   TSH 2.370 06/26/2014   HGBA1C 5.7* 01/21/2015  MICROALBUR 0.74 07/25/2013    Ct Lumbar Spine Wo Contrast  06/03/2014   CLINICAL DATA:  44 year old female with low back pain radiating to the right lower extremity. Bilateral lower extremity numbness and tingling. Initial encounter. History of cervical spine fusion.  EXAM: CT LUMBAR SPINE WITHOUT CONTRAST  TECHNIQUE: Multidetector CT imaging of the lumbar spine was performed without intravenous contrast administration. Multiplanar CT image reconstructions were also generated.  COMPARISON:  Lumbar CT myelogram 03/28/2013.  FINDINGS: Same numbering as on the comparison. Stable vertebral height and alignment. Stable bone mineralization. Visible sacrum and SI joints intact.  Stable and negative visualized non contrast abdominal viscera. Trace calcified atherosclerosis of the abdominal aorta. Normal appendix.  T12-L1: Moderate facet hypertrophy is stable.  No stenosis.  L1-L2: Moderate to severe facet hypertrophy is stable. Trace disc bulge. No significant stenosis.  L2-L3: Moderate facet hypertrophy and minor disc bulge appears stable. No stenosis.  L3-L4: Moderate to severe facet hypertrophy and mild  circumferential disc bulge appears stable. No significant stenosis.  L4-L5: Severe facet hypertrophy greater on the right. Vacuum facet phenomena on that side is chronic. Left eccentric disc bulge and ligament flavum hypertrophy not significantly changed. No significant stenosis.  L5-S1: Mild to moderate facet hypertrophy greater on the left is stable. Minor disc bulge. Mild epidural lipomatosis. No stenosis.  IMPRESSION: 1. No acute findings in the lumbar spine. 2. Dominant degenerative finding is widespread facet arthropathy, severe at the L4-L5 level and greater on the left. No significant progression since a 2014 CT lumbar myelogram.   Electronically Signed   By: Lars Pinks M.D.   On: 06/03/2014 17:12    Assessment & Plan:   Aimee [name] was seen today for cyst.  Diagnoses and all orders for this visit:  Chronic back pain  Bacterial skin infection of trunk  Cellulitis of right lower extremity  Depression  Other orders -     DULoxetine (CYMBALTA) 30 MG capsule; Take 1 capsule (30 mg total) by mouth daily. For one week then two daily. Take with a full stomach at suppertime -     gabapentin (NEURONTIN) 600 MG tablet; Take 2 tablets (1,200 mg total) by mouth 2 (two) times daily. -     Discontinue: sulfamethoxazole-trimethoprim (BACTRIM DS,SEPTRA DS) 800-160 MG per tablet; Take 1 tablet by mouth 2 (two) times daily. -     sulfamethoxazole-trimethoprim (BACTRIM DS,SEPTRA DS) 800-160 MG per tablet; Take 1 tablet by mouth 2 (two) times daily.  I have discontinued Ms. Capers's gabapentin, albuterol, ciprofloxacin, and minocycline. I am also having her start on DULoxetine and gabapentin. Additionally, I am having her maintain her simvastatin, cyclobenzaprine, cyanocobalamin, QSYMIA, B-D 3CC LUER-LOK SYR 25GX1", omega-3 acid ethyl esters, mometasone, mometasone, varenicline, Fenofibric Acid, Tapentadol HCl, tapentadol, diclofenac, and sulfamethoxazole-trimethoprim.  Meds ordered this encounter    Medications  . Tapentadol HCl (NUCYNTA ER) 250 MG TB12    Sig: Take 250 mg by mouth 2 (two) times daily.  . tapentadol (NUCYNTA) 50 MG TABS tablet    Sig: Take 50 mg by mouth 2 (two) times daily.  . diclofenac (VOLTAREN) 75 MG EC tablet    Sig: Take 75 mg by mouth 2 (two) times daily.  . DULoxetine (CYMBALTA) 30 MG capsule    Sig: Take 1 capsule (30 mg total) by mouth daily. For one week then two daily. Take with a full stomach at suppertime    Dispense:  60 capsule    Refill:  0  . gabapentin (NEURONTIN) 600 MG tablet  Sig: Take 2 tablets (1,200 mg total) by mouth 2 (two) times daily.    Dispense:  120 tablet    Refill:  2  . DISCONTD: sulfamethoxazole-trimethoprim (BACTRIM DS,SEPTRA DS) 800-160 MG per tablet    Sig: Take 1 tablet by mouth 2 (two) times daily.    Dispense:  14 tablet    Refill:  0  . sulfamethoxazole-trimethoprim (BACTRIM DS,SEPTRA DS) 800-160 MG per tablet    Sig: Take 1 tablet by mouth 2 (two) times daily.    Dispense:  30 tablet    Refill:  0     Follow-up: Return in about 1 month (around 04/18/2015) for Depression.  Claretta Fraise, M.D.

## 2015-03-20 ENCOUNTER — Telehealth: Payer: Self-pay | Admitting: Family

## 2015-03-26 ENCOUNTER — Encounter: Payer: Self-pay | Admitting: Family Medicine

## 2015-03-30 ENCOUNTER — Encounter: Payer: Self-pay | Admitting: Family Medicine

## 2015-03-30 ENCOUNTER — Ambulatory Visit (INDEPENDENT_AMBULATORY_CARE_PROVIDER_SITE_OTHER): Payer: BLUE CROSS/BLUE SHIELD | Admitting: Family Medicine

## 2015-03-30 VITALS — BP 116/84 | HR 85 | Temp 98.4°F | Ht 68.0 in | Wt 195.2 lb

## 2015-03-30 DIAGNOSIS — L0292 Furuncle, unspecified: Secondary | ICD-10-CM

## 2015-03-30 MED ORDER — SULFAMETHOXAZOLE-TRIMETHOPRIM 800-160 MG PO TABS
1.0000 | ORAL_TABLET | Freq: Two times a day (BID) | ORAL | Status: DC
Start: 2015-03-30 — End: 2015-05-29

## 2015-03-30 NOTE — Progress Notes (Signed)
Subjective:  Patient ID: Aimee [name], female    DOB: 29-Sep-1970  Age: 44 y.o. MRN: [id number]  CC: Cyst   HPI Aimee [name] presents for continued pain in the right inner thigh region. It is still red and swollen.  History Aimee [name] has a past medical history of Diabetes mellitus and Hypertriglyceridemia.   She has past surgical history that includes Abdominoplasty (2006); Inner ear surgery; Thoracic disc surgery (2011, 2012); Abdominal hysterectomy (2010); and Cesarean section (1991, 1995).   Her family history includes Diabetes in her father; GI problems in her mother.She reports that she has been smoking Cigarettes.  She has been smoking about 1.00 pack per day. She does not have any smokeless tobacco history on file. She reports that she drinks alcohol. Her drug history is not on file.  Outpatient Prescriptions Prior to Visit  Medication Sig Dispense Refill  . B-D 3CC LUER-LOK SYR 25GX1" 25G X 1" 3 ML MISC   4  . Choline Fenofibrate (FENOFIBRIC ACID) 135 MG CPDR TAKE 1 CAPSULE EVERY DAY 30 capsule 4  . cyanocobalamin (,VITAMIN B-12,) 1000 MCG/ML injection   5  . cyclobenzaprine (FLEXERIL) 10 MG tablet Take 10 mg by mouth 3 (three) times daily.    . diclofenac (VOLTAREN) 75 MG EC tablet Take 75 mg by mouth 2 (two) times daily.    . DULoxetine (CYMBALTA) 30 MG capsule Take 1 capsule (30 mg total) by mouth daily. For one week then two daily. Take with a full stomach at suppertime 60 capsule 0  . gabapentin (NEURONTIN) 600 MG tablet Take 2 tablets (1,200 mg total) by mouth 2 (two) times daily. 120 tablet 2  . mometasone (ELOCON) 0.1 % lotion Apply topically daily. 60 mL 0  . mometasone (NASONEX) 50 MCG/ACT nasal spray Place 2 sprays into the nose daily. 17 g 12  . omega-3 acid ethyl esters (LOVAZA) 1 G capsule     . QSYMIA 15-92 MG CP24   3  . tapentadol (NUCYNTA) 50 MG TABS tablet Take 50 mg by mouth 2 (two) times daily.    . Tapentadol HCl (NUCYNTA ER) 250 MG TB12 Take 250 mg by  mouth 2 (two) times daily.    . varenicline (CHANTIX STARTING MONTH PAK) 0.5 MG X 11 & 1 MG X 42 tablet Take one 0.5 mg tablet by mouth once daily for 3 days, then increase to one 0.5 mg tablet twice daily for 4 days, then increase to one 1 mg tablet twice daily. 53 tablet 0  . simvastatin (ZOCOR) 20 MG tablet Take 20 mg by mouth daily.    Marland Kitchen sulfamethoxazole-trimethoprim (BACTRIM DS,SEPTRA DS) 800-160 MG per tablet Take 1 tablet by mouth 2 (two) times daily. 30 tablet 0   No facility-administered medications prior to visit.    ROS Review of Systems  Constitutional: Negative for fever, chills, diaphoresis, appetite change and fatigue.  HENT: Negative for congestion, ear pain, hearing loss, postnasal drip, rhinorrhea, sore throat and trouble swallowing.   Respiratory: Negative for cough, chest tightness and shortness of breath.   Cardiovascular: Negative for chest pain and palpitations.  Gastrointestinal: Negative for abdominal pain.  Musculoskeletal: Negative for arthralgias.  Skin: Negative for rash.    Objective:  BP 116/84 mmHg  Pulse 85  Temp(Src) 98.4 F (36.9 C) (Oral)  Ht 5' 8"  (1.727 m)  Wt 195 lb 3.2 oz (88.542 kg)  BMI 29.69 kg/m2  BP Readings from Last 3 Encounters:  03/30/15 116/84  03/18/15 121/86  02/26/15 139/95  Wt Readings from Last 3 Encounters:  03/30/15 195 lb 3.2 oz (88.542 kg)  03/18/15 197 lb 3.2 oz (89.449 kg)  02/26/15 194 lb 12.8 oz (88.361 kg)     Physical Exam  Constitutional: She is oriented to person, place, and time. She appears well-developed and well-nourished. No distress.  HENT:  Head: Normocephalic and atraumatic.  Eyes: Conjunctivae are normal. Pupils are equal, round, and reactive to light.  Neck: Normal range of motion. Neck supple. No thyromegaly present.  Cardiovascular: Normal rate, regular rhythm and normal heart sounds.   No murmur heard. Pulmonary/Chest: Effort normal and breath sounds normal. No respiratory distress. She  has no wheezes. She has no rales.  Abdominal: Soft. Bowel sounds are normal. She exhibits no distension. There is no tenderness.  Musculoskeletal: Normal range of motion.  Lymphadenopathy:    She has no cervical adenopathy.  Neurological: She is alert and oriented to person, place, and time.  Skin: Skin is warm and dry.  There is a 5 mm x 1 cm lesion with an erythematous center and slight flexion with limited at the medial upper right thigh. This is somewhat decreased from previous exam.  With sterile prep and drape the lesion was anesthetized with 2% lidocaine with epinephrine. This was followed by [place] with limited blade scalpel and Q-tip exploration for clearing out loculations. The wound was subsequently cleansed and dry dressing applied.  Psychiatric: She has a normal mood and affect. Her behavior is normal. Judgment and thought content normal.    Lab Results  Component Value Date   HGBA1C 5.7* 01/21/2015   HGBA1C 5.6 06/26/2014   HGBA1C 5.3 02/05/2014    Lab Results  Component Value Date   WBC 11.6* 09/16/2009   HGB 15.1* 09/16/2009   HCT 43.0 09/16/2009   PLT 244 09/16/2009   GLUCOSE 85 01/21/2015   CHOL 165 01/21/2015   TRIG 117 01/21/2015   HDL 49 01/21/2015   LDLCALC 93 01/21/2015   ALT 28 01/21/2015   AST 21 01/21/2015   NA 142 01/21/2015   K 4.1 01/21/2015   CL 104 01/21/2015   CREATININE 0.87 01/21/2015   BUN 12 01/21/2015   CO2 24 01/21/2015   TSH 2.370 06/26/2014   HGBA1C 5.7* 01/21/2015   MICROALBUR 0.74 07/25/2013    Ct Lumbar Spine Wo Contrast  06/03/2014   CLINICAL DATA:  44 year old female with low back pain radiating to the right lower extremity. Bilateral lower extremity numbness and tingling. Initial encounter. History of cervical spine fusion.  EXAM: CT LUMBAR SPINE WITHOUT CONTRAST  TECHNIQUE: Multidetector CT imaging of the lumbar spine was performed without intravenous contrast administration. Multiplanar CT image reconstructions were also  generated.  COMPARISON:  Lumbar CT myelogram 03/28/2013.  FINDINGS: Same numbering as on the comparison. Stable vertebral height and alignment. Stable bone mineralization. Visible sacrum and SI joints intact.  Stable and negative visualized non contrast abdominal viscera. Trace calcified atherosclerosis of the abdominal aorta. Normal appendix.  T12-L1: Moderate facet hypertrophy is stable.  No stenosis.  L1-L2: Moderate to severe facet hypertrophy is stable. Trace disc bulge. No significant stenosis.  L2-L3: Moderate facet hypertrophy and minor disc bulge appears stable. No stenosis.  L3-L4: Moderate to severe facet hypertrophy and mild circumferential disc bulge appears stable. No significant stenosis.  L4-L5: Severe facet hypertrophy greater on the right. Vacuum facet phenomena on that side is chronic. Left eccentric disc bulge and ligament flavum hypertrophy not significantly changed. No significant stenosis.  L5-S1: Mild to moderate facet  hypertrophy greater on the left is stable. Minor disc bulge. Mild epidural lipomatosis. No stenosis.  IMPRESSION: 1. No acute findings in the lumbar spine. 2. Dominant degenerative finding is widespread facet arthropathy, severe at the L4-L5 level and greater on the left. No significant progression since a 2014 CT lumbar myelogram.   Electronically Signed   By: Lars Pinks M.D.   On: 06/03/2014 17:12    Assessment & Plan:   Aimee [name] was seen today for cyst.  Diagnoses and all orders for this visit:  Boil Orders: -     Aerobic culture  Other orders -     sulfamethoxazole-trimethoprim (BACTRIM DS,SEPTRA DS) 800-160 MG per tablet; Take 1 tablet by mouth 2 (two) times daily.   I am having Aimee [name] maintain her cyclobenzaprine, cyanocobalamin, QSYMIA, B-D 3CC LUER-LOK SYR 25GX1", omega-3 acid ethyl esters, mometasone, mometasone, varenicline, Fenofibric Acid, Tapentadol HCl, diclofenac, DULoxetine, gabapentin, and sulfamethoxazole-trimethoprim.  Meds ordered this  encounter  Medications  . sulfamethoxazole-trimethoprim (BACTRIM DS,SEPTRA DS) 800-160 MG per tablet    Sig: Take 1 tablet by mouth 2 (two) times daily.    Dispense:  30 tablet    Refill:  0   wound care reviewed with patient. Follow-up as needed should the lesion not continue to clear.  Follow-up: No Follow-up on file.  Claretta Fraise, M.D.

## 2015-03-31 ENCOUNTER — Other Ambulatory Visit: Payer: Self-pay | Admitting: Family Medicine

## 2015-04-02 ENCOUNTER — Telehealth: Payer: Self-pay | Admitting: Family Medicine

## 2015-04-02 NOTE — Telephone Encounter (Signed)
Pt notified results are not back Verbalizes understanding

## 2015-04-04 ENCOUNTER — Other Ambulatory Visit: Payer: Self-pay | Admitting: Family

## 2015-04-04 LAB — AEROBIC CULTURE

## 2015-04-06 ENCOUNTER — Telehealth: Payer: Self-pay | Admitting: *Deleted

## 2015-04-06 NOTE — Telephone Encounter (Signed)
Pt notified of results Verbalizes understanding 

## 2015-04-06 NOTE — Telephone Encounter (Signed)
-----   Message from Claretta Fraise, MD sent at 04/04/2015  4:55 PM EDT ----- Tell pt: The culture showed evidence of a variety of staph that should respond well to the antibiotic prescribed

## 2015-04-07 ENCOUNTER — Telehealth: Payer: Self-pay | Admitting: Family Medicine

## 2015-04-07 ENCOUNTER — Encounter: Payer: Self-pay | Admitting: Family Medicine

## 2015-04-07 ENCOUNTER — Other Ambulatory Visit: Payer: Self-pay | Admitting: Family Medicine

## 2015-04-07 NOTE — Telephone Encounter (Signed)
Pt notified of results Verbalizes understanding She will RTC if sxs do not resolve

## 2015-04-14 ENCOUNTER — Encounter: Payer: Self-pay | Admitting: Family Medicine

## 2015-04-15 ENCOUNTER — Encounter: Payer: Self-pay | Admitting: Family Medicine

## 2015-04-15 NOTE — Telephone Encounter (Signed)
I reviewed the records I am happy to help.I hate to admit my ignorance, but I am not sure what an RFC is. I may know it by another name. Drop by what you need and I will be glad to take a look at it.  Best Regards, Claretta Fraise

## 2015-04-23 ENCOUNTER — Telehealth: Payer: Self-pay | Admitting: Family Medicine

## 2015-04-23 NOTE — Telephone Encounter (Signed)
I will help as much as I can, but I do not do disability ratings.

## 2015-04-29 NOTE — Telephone Encounter (Signed)
Pt notified Verbalizes understanding 

## 2015-05-07 ENCOUNTER — Other Ambulatory Visit: Payer: Self-pay | Admitting: Family Medicine

## 2015-05-14 ENCOUNTER — Other Ambulatory Visit: Payer: Self-pay | Admitting: Dermatology

## 2015-05-16 ENCOUNTER — Other Ambulatory Visit: Payer: Self-pay | Admitting: Family Medicine

## 2015-05-28 ENCOUNTER — Encounter: Payer: Self-pay | Admitting: Family Medicine

## 2015-05-29 ENCOUNTER — Ambulatory Visit (INDEPENDENT_AMBULATORY_CARE_PROVIDER_SITE_OTHER): Payer: BLUE CROSS/BLUE SHIELD

## 2015-05-29 ENCOUNTER — Encounter: Payer: Self-pay | Admitting: Family Medicine

## 2015-05-29 ENCOUNTER — Ambulatory Visit (INDEPENDENT_AMBULATORY_CARE_PROVIDER_SITE_OTHER): Payer: BLUE CROSS/BLUE SHIELD | Admitting: Family Medicine

## 2015-05-29 ENCOUNTER — Other Ambulatory Visit: Payer: Self-pay | Admitting: *Deleted

## 2015-05-29 VITALS — BP 113/72 | HR 74 | Temp 97.4°F | Ht 68.0 in | Wt 194.0 lb

## 2015-05-29 DIAGNOSIS — G8929 Other chronic pain: Secondary | ICD-10-CM | POA: Diagnosis not present

## 2015-05-29 DIAGNOSIS — M79672 Pain in left foot: Secondary | ICD-10-CM

## 2015-05-29 DIAGNOSIS — M5416 Radiculopathy, lumbar region: Secondary | ICD-10-CM | POA: Diagnosis not present

## 2015-05-29 DIAGNOSIS — M2550 Pain in unspecified joint: Secondary | ICD-10-CM

## 2015-05-29 DIAGNOSIS — M549 Dorsalgia, unspecified: Secondary | ICD-10-CM

## 2015-05-29 DIAGNOSIS — E538 Deficiency of other specified B group vitamins: Secondary | ICD-10-CM

## 2015-05-29 DIAGNOSIS — F329 Major depressive disorder, single episode, unspecified: Secondary | ICD-10-CM

## 2015-05-29 DIAGNOSIS — G629 Polyneuropathy, unspecified: Secondary | ICD-10-CM | POA: Diagnosis not present

## 2015-05-29 DIAGNOSIS — F32A Depression, unspecified: Secondary | ICD-10-CM

## 2015-05-29 MED ORDER — DULOXETINE HCL 30 MG PO CPEP: 90.0000 mg | ORAL_CAPSULE | Freq: Every day | ORAL | Status: DC

## 2015-05-29 NOTE — Progress Notes (Signed)
Subjective:  Patient ID: Aimee [name], female    DOB: July 30, 1971  Age: 44 y.o. MRN: [id number]  CC: Foot Pain   HPI Aimee [name] presents for Can't walk without back pain and legs going numb. Can't write or type due to pain in fingers. Back and right hip pain generally 8/10 worse with walking. Has to ride in a wheelchair to buy groceries. Numbness, tingling and loss of muscle tone & mass. Onset of all this was about 4 months ago with the exception of some chronic back issues most of her life. Even that has increased tremendously. She was seen by neurology and told that there was nothing else they could do so they sent her on to the pain clinic at [facility]. They have tried some injections. Patient does not want to take opiates. She was given some Nucynta not realizing it was an opiate. Has not taken it as yet. This scenario has exacerbated her depression and she does not feel that the duloxetine has given her significant relief from her sadness irritability just drawn and hopeless feelings.  Additionally today she is in to have her left foot evaluated for having had a 12 foot 2 x 4 dropped on it several days ago. Yesterday she dropped another heavy object on it. It is painful and it is difficult to walk on it. It is not known the pain is mild to moderate.  She was told by neurology that she had an incredibly low B12 level. He gave her B12 shots to take every day. That was several months ago she's now taking the B12 monthly. There is concern that some of the symptoms may be related to B12 deficiency.  History Aimee [name] has a past medical history of Diabetes mellitus and Hypertriglyceridemia.   She has past surgical history that includes Abdominoplasty (2006); Inner ear surgery; Thoracic disc surgery (2011, 2012); Abdominal hysterectomy (2010); and Cesarean section (1991, 1995).   Her family history includes Diabetes in her father; GI problems in her mother.She reports that she  has been smoking Cigarettes.  She has been smoking about 1.00 pack per day. She does not have any smokeless tobacco history on file. She reports that she drinks alcohol. Her drug history is not on file.  Outpatient Prescriptions Prior to Visit  Medication Sig Dispense Refill  . B-D 3CC LUER-LOK SYR 25GX1" 25G X 1" 3 ML MISC   4  . Choline Fenofibrate (FENOFIBRIC ACID) 135 MG CPDR TAKE 1 CAPSULE EVERY DAY 30 capsule 4  . cyanocobalamin (,VITAMIN B-12,) 1000 MCG/ML injection   5  . cyclobenzaprine (FLEXERIL) 10 MG tablet Take 10 mg by mouth 3 (three) times daily.    . diclofenac (VOLTAREN) 75 MG EC tablet Take 75 mg by mouth 2 (two) times daily.    Marland Kitchen gabapentin (NEURONTIN) 600 MG tablet Take 2 tablets (1,200 mg total) by mouth 2 (two) times daily. 120 tablet 2  . mometasone (ELOCON) 0.1 % lotion Apply topically daily. 60 mL 0  . mometasone (NASONEX) 50 MCG/ACT nasal spray Place 2 sprays into the nose daily. 17 g 12  . omega-3 acid ethyl esters (LOVAZA) 1 G capsule TAKE 2 CAPSULE 2 TIMES A DAY 120 capsule 2  . QSYMIA 15-92 MG CP24   3  . simvastatin (ZOCOR) 20 MG tablet TAKE 1 TABLET BY MOUTH AT BEDTIME 30 tablet 3  . DULoxetine (CYMBALTA) 30 MG capsule Take 1 capsule (30 mg total) by mouth daily. For one week then two daily.  Take with a full stomach at suppertime 60 capsule 0  . CHANTIX STARTING MONTH PAK 0.5 MG X 11 & 1 MG X 42 tablet TAKE AS DIRECTED (Patient not taking: Reported on 05/29/2015) 53 tablet 0  . sulfamethoxazole-trimethoprim (BACTRIM DS,SEPTRA DS) 800-160 MG per tablet Take 1 tablet by mouth 2 (two) times daily. (Patient not taking: Reported on 05/29/2015) 30 tablet 0   No facility-administered medications prior to visit.    ROS Review of Systems  Constitutional: Negative for fever, chills, diaphoresis, appetite change, fatigue and unexpected weight change.  HENT: Negative for congestion, ear pain, hearing loss, postnasal drip, rhinorrhea, sneezing, sore throat and trouble  swallowing.   Eyes: Negative for pain.  Respiratory: Negative for cough, chest tightness and shortness of breath.   Cardiovascular: Negative for chest pain and palpitations.  Gastrointestinal: Negative for nausea, vomiting, abdominal pain, diarrhea and constipation.  Genitourinary: Negative for dysuria, frequency and menstrual problem.  Musculoskeletal: Positive for myalgias, back pain, joint swelling, arthralgias and neck pain.  Skin: Negative for rash.  Neurological: Positive for weakness, light-headedness and numbness. Negative for dizziness and headaches.  Psychiatric/Behavioral: Positive for dysphoric mood and decreased concentration. Negative for suicidal ideas, self-injury and agitation. The patient is not hyperactive.     Objective:  BP 113/72 mmHg  Pulse 74  Temp(Src) 97.4 F (36.3 C) (Oral)  Ht 5' 8" (1.727 m)  Wt 194 lb (87.998 kg)  BMI 29.50 kg/m2  BP Readings from Last 3 Encounters:  05/29/15 113/72  03/30/15 116/84  03/18/15 121/86    Wt Readings from Last 3 Encounters:  05/29/15 194 lb (87.998 kg)  03/30/15 195 lb 3.2 oz (88.542 kg)  03/18/15 197 lb 3.2 oz (89.449 kg)     Physical Exam  Constitutional: She is oriented to person, place, and time. She appears well-developed and well-nourished. No distress.  HENT:  Head: Normocephalic and atraumatic.  Right Ear: External ear normal.  Left Ear: External ear normal.  Nose: Nose normal.  Mouth/Throat: Oropharynx is clear and moist.  Eyes: Conjunctivae and EOM are normal. Pupils are equal, round, and reactive to light.  Neck: Normal range of motion. Neck supple. No thyromegaly present.  Cardiovascular: Normal rate, regular rhythm and normal heart sounds.   No murmur heard. Pulmonary/Chest: Effort normal and breath sounds normal. No respiratory distress. She has no wheezes. She has no rales.  Abdominal: Soft. Bowel sounds are normal. She exhibits no distension. There is no tenderness.  Musculoskeletal: Normal  range of motion. She exhibits tenderness (ultiple trigger points as well as tenderness at the distal left forefoot).  Lymphadenopathy:    She has no cervical adenopathy.  Neurological: She is alert and oriented to person, place, and time. She has normal reflexes.  Skin: Skin is warm and dry.  Psychiatric: She has a normal mood and affect. Her behavior is normal. Judgment and thought content normal.    Lab Results  Component Value Date   HGBA1C 5.7* 01/21/2015   HGBA1C 5.6 06/26/2014   HGBA1C 5.3 02/05/2014    Lab Results  Component Value Date   WBC 11.6* 09/16/2009   HGB 15.1* 09/16/2009   HCT 43.0 09/16/2009   PLT 244 09/16/2009   GLUCOSE 85 01/21/2015   CHOL 165 01/21/2015   TRIG 117 01/21/2015   HDL 49 01/21/2015   LDLCALC 93 01/21/2015   ALT 28 01/21/2015   AST 21 01/21/2015   NA 142 01/21/2015   K 4.1 01/21/2015   CL 104 01/21/2015   CREATININE  0.87 01/21/2015   BUN 12 01/21/2015   CO2 24 01/21/2015   TSH 2.370 06/26/2014   HGBA1C 5.7* 01/21/2015   MICROALBUR 0.74 07/25/2013    Ct Lumbar Spine Wo Contrast  06/03/2014   CLINICAL DATA:  44 year old female with low back pain radiating to the right lower extremity. Bilateral lower extremity numbness and tingling. Initial encounter. History of cervical spine fusion.  EXAM: CT LUMBAR SPINE WITHOUT CONTRAST  TECHNIQUE: Multidetector CT imaging of the lumbar spine was performed without intravenous contrast administration. Multiplanar CT image reconstructions were also generated.  COMPARISON:  Lumbar CT myelogram 03/28/2013.  FINDINGS: Same numbering as on the comparison. Stable vertebral height and alignment. Stable bone mineralization. Visible sacrum and SI joints intact.  Stable and negative visualized non contrast abdominal viscera. Trace calcified atherosclerosis of the abdominal aorta. Normal appendix.  T12-L1: Moderate facet hypertrophy is stable.  No stenosis.  L1-L2: Moderate to severe facet hypertrophy is stable. Trace  disc bulge. No significant stenosis.  L2-L3: Moderate facet hypertrophy and minor disc bulge appears stable. No stenosis.  L3-L4: Moderate to severe facet hypertrophy and mild circumferential disc bulge appears stable. No significant stenosis.  L4-L5: Severe facet hypertrophy greater on the right. Vacuum facet phenomena on that side is chronic. Left eccentric disc bulge and ligament flavum hypertrophy not significantly changed. No significant stenosis.  L5-S1: Mild to moderate facet hypertrophy greater on the left is stable. Minor disc bulge. Mild epidural lipomatosis. No stenosis.  IMPRESSION: 1. No acute findings in the lumbar spine. 2. Dominant degenerative finding is widespread facet arthropathy, severe at the L4-L5 level and greater on the left. No significant progression since a 2014 CT lumbar myelogram.   Electronically Signed   By: Lars Pinks M.D.   On: 06/03/2014 17:12    Assessment & Plan:   Lilyona was seen today for foot pain.  Diagnoses and all orders for this visit:  Lumbar radiculopathy -     MR Lumbar Spine Wo Contrast; Future  Left foot pain -     DG Foot Complete Left -     Post-op shoe  Chronic back pain  Depression  Peripheral neuropathy -     Lyme Ab/Western Blot Reflex -     CBC with Differential/Platelet -     CMP14+EGFR -     Vitamin B12  Arthralgia -     Lyme Ab/Western Blot Reflex -     CBC with Differential/Platelet -     CMP14+EGFR -     Vitamin B12  Vitamin B12 deficiency -     Vitamin B12  Other orders -     Discontinue: DULoxetine (CYMBALTA) 30 MG capsule; Take 3 capsules (90 mg total) by mouth daily. For one week then two daily. Take with a full stomach at suppertime   I have discontinued Ms. Rossin's DULoxetine, sulfamethoxazole-trimethoprim, and CHANTIX STARTING MONTH PAK. I am also having her maintain her cyclobenzaprine, cyanocobalamin, QSYMIA, B-D 3CC LUER-LOK SYR 25GX1", mometasone, mometasone, Fenofibric Acid, diclofenac, gabapentin,  simvastatin, omega-3 acid ethyl esters, doxycycline, Tapentadol HCl, tapentadol, and topiramate.  Meds ordered this encounter  Medications  . doxycycline (VIBRA-TABS) 100 MG tablet    Sig: Take 1 tablet by mouth daily.  . Tapentadol HCl (NUCYNTA ER) 250 MG TB12    Sig: Take 1 tablet by mouth 2 (two) times daily.  . tapentadol (NUCYNTA) 50 MG TABS tablet    Sig: Take 50 mg by mouth 2 (two) times daily.  Marland Kitchen topiramate (TOPAMAX) 25 MG tablet  Sig: Take 25 mg by mouth daily.  Marland Kitchen DISCONTD: DULoxetine (CYMBALTA) 30 MG capsule    Sig: Take 3 capsules (90 mg total) by mouth daily. For one week then two daily. Take with a full stomach at suppertime    Dispense:  90 capsule    Refill:  5   she is to take duloxetine 30 mg, 3 capsules daily. Above is inaccurate due to initial prescription being sent in with titration order in the sig rather than maintenance order. Preliminary foot x-ray reading by Claretta Fraise: No fx noted I reviewed information from her dermatologist regarding blood work. Records are being obtained from [facility] and Dr. Trula Ore, Her neurologist. Follow-up: Return in about 2 weeks (around 06/12/2015).  Claretta Fraise, M.D.

## 2015-05-30 ENCOUNTER — Encounter: Payer: Self-pay | Admitting: Family Medicine

## 2015-05-30 DIAGNOSIS — R531 Weakness: Secondary | ICD-10-CM

## 2015-05-30 DIAGNOSIS — M79609 Pain in unspecified limb: Secondary | ICD-10-CM

## 2015-05-30 DIAGNOSIS — R202 Paresthesia of skin: Principal | ICD-10-CM

## 2015-06-01 LAB — CMP14+EGFR
ALK PHOS: 30 IU/L — AB (ref 39–117)
ALT: 14 IU/L (ref 0–32)
AST: 13 IU/L (ref 0–40)
Albumin/Globulin Ratio: 1.8 (ref 1.1–2.5)
Albumin: 4.4 g/dL (ref 3.5–5.5)
BILIRUBIN TOTAL: 0.4 mg/dL (ref 0.0–1.2)
BUN/Creatinine Ratio: 14 (ref 9–23)
BUN: 11 mg/dL (ref 6–24)
CHLORIDE: 105 mmol/L (ref 97–108)
CO2: 23 mmol/L (ref 18–29)
Calcium: 9.5 mg/dL (ref 8.7–10.2)
Creatinine, Ser: 0.8 mg/dL (ref 0.57–1.00)
GFR calc Af Amer: 104 mL/min/{1.73_m2} (ref >59)
GFR calc non Af Amer: 90 mL/min/{1.73_m2} (ref >59)
GLUCOSE: 82 mg/dL (ref 65–99)
Globulin, Total: 2.4 g/dL (ref 1.5–4.5)
POTASSIUM: 3.9 mmol/L (ref 3.5–5.2)
Sodium: 142 mmol/L (ref 134–144)
Total Protein: 6.8 g/dL (ref 6.0–8.5)

## 2015-06-01 LAB — LYME AB/WESTERN BLOT REFLEX

## 2015-06-01 LAB — CBC WITH DIFFERENTIAL/PLATELET
BASOS ABS: 0 10*3/uL (ref 0.0–0.2)
Basos: 0 %
EOS (ABSOLUTE): 0 10*3/uL (ref 0.0–0.4)
Eos: 0 %
Hematocrit: 41.7 % (ref 34.0–46.6)
Hemoglobin: 14.1 g/dL (ref 11.1–15.9)
IMMATURE GRANS (ABS): 0 10*3/uL (ref 0.0–0.1)
Immature Granulocytes: 0 %
LYMPHS: 38 %
Lymphocytes Absolute: 3.5 10*3/uL — ABNORMAL HIGH (ref 0.7–3.1)
MCH: 32.2 pg (ref 26.6–33.0)
MCHC: 33.8 g/dL (ref 31.5–35.7)
MCV: 95 fL (ref 79–97)
MONOS ABS: 0.4 10*3/uL (ref 0.1–0.9)
Monocytes: 5 %
NEUTROS ABS: 5.2 10*3/uL (ref 1.4–7.0)
Neutrophils: 57 %
PLATELETS: 299 10*3/uL (ref 150–379)
RBC: 4.38 x10E6/uL (ref 3.77–5.28)
RDW: 13.7 % (ref 12.3–15.4)
WBC: 9.2 10*3/uL (ref 3.4–10.8)

## 2015-06-01 LAB — VITAMIN B12: VITAMIN B 12: 664 pg/mL (ref 211–946)

## 2015-06-02 ENCOUNTER — Telehealth: Payer: Self-pay | Admitting: Family Medicine

## 2015-06-02 NOTE — Telephone Encounter (Signed)
Patient aware.

## 2015-06-02 NOTE — Telephone Encounter (Signed)
Those results are not available yet. Reassure her that we will call as soon as we hear something hopefully this afternoon.

## 2015-06-04 ENCOUNTER — Telehealth: Payer: Self-pay

## 2015-06-04 NOTE — Telephone Encounter (Signed)
Patient said she gave you a copy of a paper that talks about the magnet in her head and how to do the MRI   She needs that faxed to [place] because her MRI is set up for Oct 6th   Fax is [phone]

## 2015-06-08 NOTE — Telephone Encounter (Signed)
Pt notified info sheet faxed to GI Per request

## 2015-06-09 ENCOUNTER — Other Ambulatory Visit: Payer: Self-pay

## 2015-06-09 DIAGNOSIS — Z1231 Encounter for screening mammogram for malignant neoplasm of breast: Secondary | ICD-10-CM

## 2015-06-10 ENCOUNTER — Encounter: Payer: Self-pay | Admitting: Family Medicine

## 2015-06-18 ENCOUNTER — Ambulatory Visit
Admission: RE | Admit: 2015-06-18 | Discharge: 2015-06-18 | Disposition: A | Discharge status: Home / Self Care | Payer: BLUE CROSS/BLUE SHIELD | Source: Ambulatory Visit | Attending: Family Medicine | Admitting: Family Medicine

## 2015-06-18 ENCOUNTER — Ambulatory Visit
Admission: RE | Admit: 2015-06-18 | Discharge: 2015-06-18 | Disposition: A | Discharge status: Home / Self Care | Payer: BLUE CROSS/BLUE SHIELD | Source: Ambulatory Visit

## 2015-06-18 DIAGNOSIS — M5416 Radiculopathy, lumbar region: Secondary | ICD-10-CM

## 2015-06-18 DIAGNOSIS — R202 Paresthesia of skin: Principal | ICD-10-CM

## 2015-06-18 DIAGNOSIS — M79609 Pain in unspecified limb: Secondary | ICD-10-CM

## 2015-06-18 DIAGNOSIS — R531 Weakness: Secondary | ICD-10-CM

## 2015-06-18 DIAGNOSIS — Z1231 Encounter for screening mammogram for malignant neoplasm of breast: Secondary | ICD-10-CM

## 2015-06-18 MED ORDER — GADOBENATE DIMEGLUMINE 529 MG/ML IV SOLN
18.0000 mL | Freq: Once | INTRAVENOUS | Status: AC | PRN
Start: 2015-06-18 — End: 2015-06-18
  Administered 2015-06-18: 18 mL via INTRAVENOUS

## 2015-06-19 ENCOUNTER — Encounter: Payer: Self-pay | Admitting: Family Medicine

## 2015-06-22 ENCOUNTER — Telehealth: Payer: Self-pay | Admitting: Family Medicine

## 2015-06-22 ENCOUNTER — Encounter: Payer: Self-pay | Admitting: Family Medicine

## 2015-06-22 DIAGNOSIS — R200 Anesthesia of skin: Secondary | ICD-10-CM

## 2015-06-22 DIAGNOSIS — R202 Paresthesia of skin: Principal | ICD-10-CM

## 2015-06-22 NOTE — Telephone Encounter (Signed)
Please review pt's MR brain and L spine and advise

## 2015-06-23 NOTE — Telephone Encounter (Signed)
Refer to neurology. Patient prefers [facility]. Please use [facility] or [facility]. Do not use [facility] per patient request. This is to evaluate for the possibility of multiple sclerosis due to ongoing pains and odd symptoms plus a moderately suggestive MRI report of her brain

## 2015-06-23 NOTE — Telephone Encounter (Signed)
Pt notified of results Verbalizes understanding 

## 2015-06-24 ENCOUNTER — Encounter: Payer: Self-pay | Admitting: *Deleted

## 2015-06-26 ENCOUNTER — Encounter: Payer: Self-pay | Admitting: Diagnostic Neuroimaging

## 2015-06-26 ENCOUNTER — Ambulatory Visit (INDEPENDENT_AMBULATORY_CARE_PROVIDER_SITE_OTHER): Payer: BLUE CROSS/BLUE SHIELD | Admitting: Diagnostic Neuroimaging

## 2015-06-26 VITALS — BP 123/81 | HR 86 | Ht 68.0 in | Wt 197.8 lb

## 2015-06-26 DIAGNOSIS — R938 Abnormal findings on diagnostic imaging of other specified body structures: Secondary | ICD-10-CM | POA: Diagnosis not present

## 2015-06-26 DIAGNOSIS — G894 Chronic pain syndrome: Secondary | ICD-10-CM | POA: Diagnosis not present

## 2015-06-26 DIAGNOSIS — R413 Other amnesia: Secondary | ICD-10-CM

## 2015-06-26 DIAGNOSIS — R200 Anesthesia of skin: Secondary | ICD-10-CM

## 2015-06-26 DIAGNOSIS — R9389 Abnormal findings on diagnostic imaging of other specified body structures: Secondary | ICD-10-CM

## 2015-06-26 NOTE — Patient Instructions (Addendum)
Thank you for coming to see [place] at [facility]. I hope we have been able to provide you high quality care today.  You may receive a patient satisfaction survey over the next few weeks. We would appreciate your feedback and comments so that we may continue to improve ourselves and the health of our patients.  - consider psychology/psychiary referral - continue pain management - repeat MRI brain in 4 months   ~~~~~~~~~~~~~~~~~~~~~~~~~~~~~~~~~~~~~~~~~~~~~~~~~~~~~~~~~~~~~~~~~  DR. Nikitia Asbill'S GUIDE TO HAPPY AND HEALTHY LIVING These are some of my general health and wellness recommendations. Some of them may apply to you better than others. Please use common sense as you try these suggestions and feel free to ask me any questions.   ACTIVITY/FITNESS Mental, social, emotional and physical stimulation are very important for brain and body health. Try learning a new activity (arts, music, language, sports, games).  Keep moving your body to the best of your abilities. You can do this at home, inside or outside, the park, community center, gym or anywhere you like. Consider a physical therapist or personal trainer to get started. Consider the app Sworkit. Fitness trackers such as smart-watches, smart-phones or Fitbits can help as well.   NUTRITION Eat more plants: colorful vegetables, nuts, seeds and berries.  Eat less sugar, salt, preservatives and processed foods.  Avoid toxins such as cigarettes and alcohol.  Drink water when you are thirsty. Warm water with a slice of lemon is an excellent morning drink to start the day.  Consider these websites for more information The Nutrition [employer] (https://www.henry-hernandez.biz/) [employer] Nutrition (WindowBlog.ch)   RELAXATION Consider practicing mindfulness meditation or other relaxation techniques such as deep breathing, prayer, yoga, tai chi, massage. See website mindful.org or the  apps Headspace or Calm to help get started.   SLEEP Try to get at least 7-8+ hours sleep per day. Regular exercise and reduced caffeine will help you sleep better. Practice good sleep hygeine techniques. See website sleep.org for more information.   PLANNING Prepare estate planning, living will, healthcare POA documents. Sometimes this is best planned with the help of an attorney. Theconversationproject.org and agingwithdignity.org are excellent resources.
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
## 2015-07-27 ENCOUNTER — Other Ambulatory Visit: Payer: Self-pay | Admitting: Family

## 2015-08-04 ENCOUNTER — Other Ambulatory Visit: Payer: Self-pay | Admitting: Family Medicine

## 2015-08-11 ENCOUNTER — Other Ambulatory Visit: Payer: Self-pay | Admitting: Family Medicine

## 2015-08-11 ENCOUNTER — Other Ambulatory Visit: Payer: Self-pay | Admitting: Adult Health

## 2015-08-11 ENCOUNTER — Other Ambulatory Visit: Payer: Self-pay | Admitting: Nurse Practitioner

## 2015-08-11 DIAGNOSIS — Z0271 Encounter for disability determination: Secondary | ICD-10-CM

## 2015-08-19 ENCOUNTER — Encounter: Payer: Self-pay | Admitting: Family Medicine

## 2015-08-19 ENCOUNTER — Ambulatory Visit (INDEPENDENT_AMBULATORY_CARE_PROVIDER_SITE_OTHER): Payer: BLUE CROSS/BLUE SHIELD | Admitting: Family Medicine

## 2015-08-19 ENCOUNTER — Ambulatory Visit (INDEPENDENT_AMBULATORY_CARE_PROVIDER_SITE_OTHER): Payer: BLUE CROSS/BLUE SHIELD

## 2015-08-19 VITALS — BP 120/83 | HR 81 | Temp 98.8°F | Ht 68.0 in | Wt 198.4 lb

## 2015-08-19 DIAGNOSIS — S46812D Strain of other muscles, fascia and tendons at shoulder and upper arm level, left arm, subsequent encounter: Secondary | ICD-10-CM | POA: Diagnosis not present

## 2015-08-19 DIAGNOSIS — F329 Major depressive disorder, single episode, unspecified: Secondary | ICD-10-CM | POA: Diagnosis not present

## 2015-08-19 DIAGNOSIS — F32A Depression, unspecified: Secondary | ICD-10-CM

## 2015-08-19 DIAGNOSIS — S46819A Strain of other muscles, fascia and tendons at shoulder and upper arm level, unspecified arm, initial encounter: Secondary | ICD-10-CM | POA: Insufficient documentation

## 2015-08-19 DIAGNOSIS — E119 Type 2 diabetes mellitus without complications: Secondary | ICD-10-CM

## 2015-08-19 DIAGNOSIS — H6501 Acute serous otitis media, right ear: Secondary | ICD-10-CM | POA: Diagnosis not present

## 2015-08-19 DIAGNOSIS — R5383 Other fatigue: Secondary | ICD-10-CM

## 2015-08-19 DIAGNOSIS — E785 Hyperlipidemia, unspecified: Secondary | ICD-10-CM

## 2015-08-19 LAB — POCT GLYCOSYLATED HEMOGLOBIN (HGB A1C): HEMOGLOBIN A1C: 5.3

## 2015-08-19 MED ORDER — AMOXICILLIN-POT CLAVULANATE 875-125 MG PO TABS: 1.0000 | ORAL_TABLET | Freq: Two times a day (BID) | ORAL | Status: DC

## 2015-08-19 MED ORDER — MIRTAZAPINE 30 MG PO TABS: 30.0000 mg | ORAL_TABLET | Freq: Every day | ORAL | Status: DC

## 2015-08-19 MED ORDER — DULOXETINE HCL 60 MG PO CPEP: 60.0000 mg | ORAL_CAPSULE | Freq: Every day | ORAL | Status: DC
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
## 2015-08-20 LAB — CMP14+EGFR
ALBUMIN: 4.2 g/dL (ref 3.5–5.5)
ALT: 16 IU/L (ref 0–32)
AST: 14 IU/L (ref 0–40)
Albumin/Globulin Ratio: 1.8 (ref 1.1–2.5)
Alkaline Phosphatase: 33 IU/L — ABNORMAL LOW (ref 39–117)
BUN / CREAT RATIO: 19 (ref 9–23)
BUN: 14 mg/dL (ref 6–24)
CALCIUM: 9.6 mg/dL (ref 8.7–10.2)
CHLORIDE: 105 mmol/L (ref 97–106)
CO2: 23 mmol/L (ref 18–29)
CREATININE: 0.73 mg/dL (ref 0.57–1.00)
GFR calc non Af Amer: 100 mL/min/{1.73_m2} (ref >59)
GFR, EST AFRICAN AMERICAN: 116 mL/min/{1.73_m2} (ref >59)
GLUCOSE: 101 mg/dL — AB (ref 65–99)
Globulin, Total: 2.3 g/dL (ref 1.5–4.5)
Potassium: 4.4 mmol/L (ref 3.5–5.2)
Sodium: 142 mmol/L (ref 136–144)
TOTAL PROTEIN: 6.5 g/dL (ref 6.0–8.5)

## 2015-08-20 LAB — CBC WITH DIFFERENTIAL/PLATELET
BASOS: 0 %
Basophils Absolute: 0 10*3/uL (ref 0.0–0.2)
EOS (ABSOLUTE): 0 10*3/uL (ref 0.0–0.4)
EOS: 0 %
HEMATOCRIT: 41.6 % (ref 34.0–46.6)
Hemoglobin: 14.3 g/dL (ref 11.1–15.9)
Immature Grans (Abs): 0 10*3/uL (ref 0.0–0.1)
Immature Granulocytes: 0 %
LYMPHS ABS: 1.8 10*3/uL (ref 0.7–3.1)
Lymphs: 23 %
MCH: 33.8 pg — ABNORMAL HIGH (ref 26.6–33.0)
MCHC: 34.4 g/dL (ref 31.5–35.7)
MCV: 98 fL — AB (ref 79–97)
MONOS ABS: 0.3 10*3/uL (ref 0.1–0.9)
Monocytes: 4 %
NEUTROS PCT: 73 %
Neutrophils Absolute: 5.7 10*3/uL (ref 1.4–7.0)
PLATELETS: 292 10*3/uL (ref 150–379)
RBC: 4.23 x10E6/uL (ref 3.77–5.28)
RDW: 13 % (ref 12.3–15.4)
WBC: 7.8 10*3/uL (ref 3.4–10.8)

## 2015-08-20 LAB — LIPID PANEL
Chol/HDL Ratio: 3.6 ratio units (ref 0.0–4.4)
Cholesterol, Total: 185 mg/dL (ref 100–199)
HDL: 52 mg/dL (ref >39)
LDL CALC: 112 mg/dL — AB (ref 0–99)
Triglycerides: 105 mg/dL (ref 0–149)
VLDL CHOLESTEROL CAL: 21 mg/dL (ref 5–40)

## 2015-08-20 LAB — MICROALBUMIN / CREATININE URINE RATIO
Creatinine, Urine: 129.7 mg/dL
MICROALB/CREAT RATIO: 14.6 mg/g creat (ref 0.0–30.0)
Microalbumin, Urine: 19 ug/mL

## 2015-08-21 ENCOUNTER — Telehealth: Payer: Self-pay | Admitting: *Deleted

## 2015-08-21 ENCOUNTER — Other Ambulatory Visit: Payer: Self-pay | Admitting: Family Medicine

## 2015-08-21 MED ORDER — FLUCONAZOLE 150 MG PO TABS: 150.0000 mg | ORAL_TABLET | Freq: Once | ORAL | Status: DC

## 2015-08-21 NOTE — Telephone Encounter (Signed)
-----   Message from Claretta Fraise, MD sent at 08/20/2015  9:36 PM EST ----- Aimee [name],    Your lab result is normal.Some minor variations that are not significant are commonly marked abnormal, but do not represent any medical problem for you.  Best regards, Claretta Fraise, M.D.

## 2015-08-21 NOTE — Telephone Encounter (Signed)
Pt notified of results Verbalizes understanding 

## 2015-08-24 ENCOUNTER — Other Ambulatory Visit: Payer: Self-pay | Admitting: Family Medicine

## 2015-08-26 DIAGNOSIS — Z029 Encounter for administrative examinations, unspecified: Secondary | ICD-10-CM

## 2015-08-28 ENCOUNTER — Ambulatory Visit: Payer: BLUE CROSS/BLUE SHIELD | Admitting: Family Medicine

## 2015-08-29 ENCOUNTER — Encounter: Payer: Self-pay | Admitting: Family Medicine

## 2015-08-31 ENCOUNTER — Ambulatory Visit: Payer: BLUE CROSS/BLUE SHIELD | Admitting: Family Medicine

## 2015-09-01 ENCOUNTER — Encounter: Payer: Self-pay | Admitting: Family Medicine

## 2015-09-01 ENCOUNTER — Other Ambulatory Visit: Payer: Self-pay | Admitting: Family Medicine

## 2015-09-01 NOTE — Telephone Encounter (Signed)
Pt wants referral to Dr Lyla Son for pain medicine and rheumatology Please advise

## 2015-09-09 ENCOUNTER — Other Ambulatory Visit: Payer: Self-pay | Admitting: Adult Health

## 2015-09-09 ENCOUNTER — Other Ambulatory Visit: Payer: Self-pay | Admitting: Family Medicine

## 2015-09-10 ENCOUNTER — Ambulatory Visit (INDEPENDENT_AMBULATORY_CARE_PROVIDER_SITE_OTHER): Payer: BLUE CROSS/BLUE SHIELD | Admitting: Family Medicine

## 2015-09-10 ENCOUNTER — Encounter: Payer: Self-pay | Admitting: Family Medicine

## 2015-09-10 VITALS — BP 127/91 | HR 85 | Temp 98.5°F | Ht 68.0 in | Wt 201.6 lb

## 2015-09-10 DIAGNOSIS — F4310 Post-traumatic stress disorder, unspecified: Secondary | ICD-10-CM

## 2015-09-10 DIAGNOSIS — M5136 Other intervertebral disc degeneration, lumbar region: Secondary | ICD-10-CM | POA: Diagnosis not present

## 2015-09-10 DIAGNOSIS — F32A Depression, unspecified: Secondary | ICD-10-CM

## 2015-09-10 DIAGNOSIS — M79609 Pain in unspecified limb: Secondary | ICD-10-CM | POA: Diagnosis not present

## 2015-09-10 DIAGNOSIS — G6289 Other specified polyneuropathies: Secondary | ICD-10-CM

## 2015-09-10 DIAGNOSIS — R202 Paresthesia of skin: Secondary | ICD-10-CM

## 2015-09-10 DIAGNOSIS — F329 Major depressive disorder, single episode, unspecified: Secondary | ICD-10-CM

## 2015-09-10 DIAGNOSIS — R531 Weakness: Secondary | ICD-10-CM

## 2015-09-10 DIAGNOSIS — E781 Pure hyperglyceridemia: Secondary | ICD-10-CM | POA: Diagnosis not present

## 2015-09-10 DIAGNOSIS — M5416 Radiculopathy, lumbar region: Secondary | ICD-10-CM | POA: Diagnosis not present

## 2015-09-10 MED ORDER — QUETIAPINE FUMARATE ER 200 MG PO TB24
200.0000 mg | ORAL_TABLET | Freq: Every day | ORAL | Status: DC
Start: 2015-09-10 — End: 2015-09-21

## 2015-09-10 MED ORDER — DULOXETINE HCL 60 MG PO CPEP: 60.0000 mg | ORAL_CAPSULE | Freq: Two times a day (BID) | ORAL | Status: DC

## 2015-09-10 MED ORDER — GABAPENTIN 600 MG PO TABS: ORAL_TABLET | ORAL | Status: DC

## 2015-09-10 NOTE — Patient Instructions (Signed)
Gluten-Free Diet for Celiac Disease Gluten is a protein found in wheat, rye, barley, and triticale (a cross between wheat and rye) grains. People with celiac disease need to have a gluten-free diet. With celiac disease, gluten interferes with the absorption of food and may also cause intestinal injury.  Strict compliance is important even during symptom-free periods. This means eliminating all foods with gluten from your diet permanently. This requires some significant changes but is very manageable. WHAT DO I NEED TO KNOW ABOUT A GLUTEN-FREE DIET?  Look for items labeled with "GF." Looking for GF will make it easier to identify products that are safe to eat.  Read all labels. Gluten may have been added as a minor ingredient where least expected, such as in shredded cheeses or ice creams. Always check food labels and investigate questionable ingredients. Talk to your dietitian or health care provider if you have questions about certain foods or need help finding GF foods.  Check when in doubt. If you are not sure whether an ingredient contains gluten, check with the manufacturer. Note that some manufacturers may change ingredients without notice. Always read labels.   Know how food is prepared. Since flour and cereal products are often used in the preparation of foods, it is important to be aware of the methods of preparation used, as well as the ingredients in the foods themselves. This is especially true when you are dining out. Ask restaurants if they have a gluten-free menu.  Watch for cross-contamination. Cross-contamination occurs when gluten-free foods come into contact with foods that contain gluten. It often happens during the manufacturing process. Always check the ingredient list and for warnings on packages, such as "may contain gluten."  Eat a balanced diet. It is important to still get enough fiber, iron, and B vitamins in your diet. Look for enriched whole grain gluten-free products  and continue to eat a well-balanced diet of the important non-grain items, such as vegetables, fruit, lean proteins, legumes, and dairy.  Consider taking a gluten-free multivitamin and mineral supplement. Discuss this with your health care provider. WHAT KEY WORDS HELP IDENTIFY GLUTEN? Know key words to help identify gluten. A dietitian can help you identify possible harmful ingredients in the foods you normally eat. Words to check for on food labels include:   Flour, enriched flour, bromated flour, white flour, durum flour, graham flour, phosphated flour, self-rising flour, semolina, or farina.  Starch, dextrin, modified food starch, or cereal.  Thickening, fillers, or emulsifiers.  Any kind of malt flavoring, extract, or syrup (malt is made from barley and includes malt vinegar, malted milk, and malted beverages).  Hydrolyzed vegetable protein. WHAT FOODS CAN I EAT? Below is a list of common foods that are allowed with a gluten-free diet.  Grains Products made from the following flours or grains:amaranth,bean flours, 100% buckwheat flour, corn, millet, nut flours or meals, GF oats, quinoa, rice, sorghum, teff, any all-purpose 100% GF flour mix, rice wafers, pure cornmeal tortillas, popcorn, some crackers, some chips, and hot cereals made from cornmeal. Ask your dietitian which specific hot and cold cereals are allowed. Hominy, rice or wild rice, and special GF pasta. Some Asian rice noodles or bean noodles. Arrowroot starch, corn bran, corn flour, corn germ, cornmeal, corn starch, potato flour, potato starch flour, and rice bran. Rice flours: plain, brown, and sweet. Rice polish, soy flour, tapioca starch. Vegetables All plain, fresh, frozen, or canned vegetables.  Fruits All fresh, frozen, canned, dried fruits, and fruit juices.  Meats and Other  Protein Foods Meat, fish, poultry, or eggs prepared without added wheat, rye, barley, or triticale. Some luncheon meat and some frankfurters.  Pure meat. All aged cheese, most processed cheese products, some cottage cheese, and some cream cheese. Dried beans, dried peas, and lentils.  Dairy Milk and yogurt made with allowed ingredients.  Beverages Coffee (regular or decaffeinated), tea, herbal tea (read label to be sure that no wheat flour has been added). Carbonated beverages and some root beers. Wine, sake, and distilled spirits, such as gin, vodka, and whiskey. GF beers and GF ciders.  Sweetsand Desserts Sugar, honey, some syrups, molasses, jelly, jam, plain hard candy, marshmallows, gumdrops, homemade candies free of wheat, rye, barley, or triticale. Coconut. Custard, some pudding mixes, and homemade puddings from cornstarch, rice, and tapioca. Gelatin desserts, sorbets, frozen ice pops, and sherbet. Cake, cookies, and other desserts prepared with allowed flours. Some commercial ice creams. Ask your dietitian about specific brands of dessert that are allowed.  Fats and Oils Butter, margarine, vegetable oil, sour cream not containing modified food starch, whipping cream, shortening, lard, cream, and some mayonnaise. Some commercial salad dressings. Peanut butter.  Other Homemade broth and soups made with allowed ingredients; some canned or frozen soups. Any other combination or prepared foods that do not contain gluten. Monosodium glutamate (MSG). Cider, rice, and wine vinegar. Baking soda and baking powder. Certain soy sauces (Tamari). Ask your dietitian about specific brands that are allowed. Nuts, coconut, chocolate, and pure cocoa powder. Salt, pepper, herbs, spices, extracts, and food colorings. The items listed above may not be a complete list of allowed foods or beverages. Contact your dietitian for more options.  WHAT FOODS CAN I NOT EAT? Below is a list of common foods that are not allowed with a gluten-free diet.  Grains Barley, bran, bulgur, cracked wheat, graham, malt, matzo, wheat germ, and all wheat and rye cereals  including spelt and kamut. Avoid cereals containing malt as a flavoring, such as rice cereal. Also avoid regular noodles, spaghetti, macaroni, and most packaged rice mixes, and all others containing wheat, rye, barley, or triticale.  Vegetables Most creamed vegetables, most vegetables canned in sauces, and any vegetables prepared with wheat, rye, barley, or triticale.  Fruits Thickened or prepared fruits and some pie fillings.  Meats and Other Protein Sources Any meat or meat alternative containing wheat, rye, barley, or gluten stabilizers (such as some hot dogs, salami, cold cuts, or sausage). Bread-containing products, such as Swiss steak, croquettes, and meatloaf. Most tuna canned in vegetable broth, [place] with hydrolyzed vegetable protein (HVP) injected as part of the basting, and any cheese product containing oat gum as an ingredient. Seitan. Imitation fish. Dairy Commercial chocolate milk, which may have cereal added, and malted milk. Beverages Certain cereal beverages. Beer and ciders (unless GF), ale, malted milk, and some root beers. Sweetsand Desserts Commercial candies containing wheat, rye, barley, or triticale. Certain toffees are dusted with wheat flour. Chocolate-coated nuts, which are often rolled in flour. Cakes, cookies, doughnuts, and pastries that are prepared with wheat, barley, rye, or triticale flour. Some commercial ice creams, ice cream flavors which contain cookies, crumbs, or cheesecake. Ice cream cones. Commercially prepared mixes for cakes, cookies, and other desserts unless marked GF. Bread pudding and other puddings thickened with flour. Fats and Oils Some commercial salad dressings and sour cream containing modified food starch.  Condiments Some curry powder, some dry seasoning mixes, some gravy extracts, some meat sauces, some ketchup, some prepared mustard, horseradish. Other All soups containing wheat,  rye, barley, or triticale flour. Bouillon and bouillon  cubes that contain HVP. Combination or prepared foods that contain gluten. Some soy sauce, some chip dips, and some chewing gum. Yeast extract (contains barley). Caramel color (may contain malt). The items listed above may not be a complete list of foods and beverages to avoid. Contact your dietitian for more information.   This information is not intended to replace advice given to you by your health care provider. Make sure you discuss any questions you have with your health care provider.   Document Released: 08/29/2005 Document Revised: 09/19/2014 Document Reviewed: 07/03/2013 Elsevier Interactive Patient Education [employer].
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
## 2015-09-15 ENCOUNTER — Telehealth: Payer: Self-pay

## 2015-09-15 MED ORDER — ARIPIPRAZOLE 5 MG PO TABS: 5.0000 mg | ORAL_TABLET | Freq: Every day | ORAL | Status: DC

## 2015-09-15 NOTE — Telephone Encounter (Signed)
Aripiprazole scrip sent

## 2015-09-15 NOTE — Telephone Encounter (Signed)
Patient informed via detailed message

## 2015-09-15 NOTE — Telephone Encounter (Signed)
Insurance denied prior authorization for Quetiapine ER  Must try alternative meds: Aripiprazole clozapine, olanzapine, quetiapine IR paliperidone, risperidone or ziprasidone

## 2015-09-18 ENCOUNTER — Telehealth: Payer: Self-pay | Admitting: Family Medicine

## 2015-09-21 ENCOUNTER — Ambulatory Visit (INDEPENDENT_AMBULATORY_CARE_PROVIDER_SITE_OTHER): Payer: BLUE CROSS/BLUE SHIELD | Admitting: Family Medicine

## 2015-09-21 ENCOUNTER — Encounter: Payer: Self-pay | Admitting: Family Medicine

## 2015-09-21 VITALS — BP 125/89 | HR 94 | Temp 98.2°F | Ht 68.0 in | Wt 205.8 lb

## 2015-09-21 DIAGNOSIS — M79609 Pain in unspecified limb: Secondary | ICD-10-CM | POA: Diagnosis not present

## 2015-09-21 DIAGNOSIS — F32A Depression, unspecified: Secondary | ICD-10-CM

## 2015-09-21 DIAGNOSIS — R197 Diarrhea, unspecified: Secondary | ICD-10-CM

## 2015-09-21 DIAGNOSIS — F329 Major depressive disorder, single episode, unspecified: Secondary | ICD-10-CM | POA: Diagnosis not present

## 2015-09-21 DIAGNOSIS — R202 Paresthesia of skin: Secondary | ICD-10-CM | POA: Diagnosis not present

## 2015-09-21 MED ORDER — DULOXETINE HCL 60 MG PO CPEP: 60.0000 mg | ORAL_CAPSULE | Freq: Two times a day (BID) | ORAL | Status: DC

## 2015-09-21 MED ORDER — ZIPRASIDONE HCL 40 MG PO CAPS: 40.0000 mg | ORAL_CAPSULE | Freq: Two times a day (BID) | ORAL | Status: DC

## 2015-09-21 MED ORDER — CIPROFLOXACIN HCL 500 MG PO TABS: 500.0000 mg | ORAL_TABLET | Freq: Two times a day (BID) | ORAL | Status: DC

## 2015-09-21 MED ORDER — METRONIDAZOLE 500 MG PO TABS: 500.0000 mg | ORAL_TABLET | Freq: Three times a day (TID) | ORAL | Status: DC
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
## 2015-09-22 ENCOUNTER — Telehealth: Payer: Self-pay

## 2015-09-22 LAB — CMP14+EGFR
A/G RATIO: 1.5 (ref 1.1–2.5)
ALT: 20 IU/L (ref 0–32)
AST: 17 IU/L (ref 0–40)
Albumin: 3.8 g/dL (ref 3.5–5.5)
Alkaline Phosphatase: 38 IU/L — ABNORMAL LOW (ref 39–117)
BUN/Creatinine Ratio: 21 (ref 9–23)
BUN: 14 mg/dL (ref 6–24)
CALCIUM: 9 mg/dL (ref 8.7–10.2)
CHLORIDE: 106 mmol/L (ref 96–106)
CO2: 26 mmol/L (ref 18–29)
Creatinine, Ser: 0.67 mg/dL (ref 0.57–1.00)
GFR calc Af Amer: 124 mL/min/{1.73_m2} (ref >59)
GFR calc non Af Amer: 107 mL/min/{1.73_m2} (ref >59)
GLUCOSE: 95 mg/dL (ref 65–99)
Globulin, Total: 2.5 g/dL (ref 1.5–4.5)
POTASSIUM: 4.2 mmol/L (ref 3.5–5.2)
Sodium: 145 mmol/L — ABNORMAL HIGH (ref 134–144)
TOTAL PROTEIN: 6.3 g/dL (ref 6.0–8.5)

## 2015-09-22 LAB — CBC WITH DIFFERENTIAL/PLATELET
BASOS ABS: 0 10*3/uL (ref 0.0–0.2)
BASOS: 0 %
EOS (ABSOLUTE): 0 10*3/uL (ref 0.0–0.4)
EOS: 0 %
Hematocrit: 41.4 % (ref 34.0–46.6)
Hemoglobin: 13.8 g/dL (ref 11.1–15.9)
IMMATURE GRANS (ABS): 0 10*3/uL (ref 0.0–0.1)
IMMATURE GRANULOCYTES: 0 %
Lymphocytes Absolute: 3.8 10*3/uL — ABNORMAL HIGH (ref 0.7–3.1)
Lymphs: 39 %
MCH: 33.1 pg — AB (ref 26.6–33.0)
MCHC: 33.3 g/dL (ref 31.5–35.7)
MCV: 99 fL — AB (ref 79–97)
MONOCYTES: 7 %
MONOS ABS: 0.7 10*3/uL (ref 0.1–0.9)
Neutrophils Absolute: 5.3 10*3/uL (ref 1.4–7.0)
Neutrophils: 54 %
PLATELETS: 268 10*3/uL (ref 150–379)
RBC: 4.17 x10E6/uL (ref 3.77–5.28)
RDW: 13 % (ref 12.3–15.4)
WBC: 9.8 10*3/uL (ref 3.4–10.8)

## 2015-09-22 LAB — SPECIMEN STATUS REPORT

## 2015-09-22 NOTE — Telephone Encounter (Signed)
Please order

## 2015-09-22 NOTE — Telephone Encounter (Signed)
Orders entered in [place] Per Dr [name]

## 2015-09-22 NOTE — Addendum Note (Signed)
Addended by: Marin Olp on: 09/22/2015 05:43 PM   Modules accepted: Orders

## 2015-09-22 NOTE — Telephone Encounter (Signed)
Patient said you are suppose to be ordering a brain MRI and shoulder MRI

## 2015-09-23 ENCOUNTER — Encounter: Payer: Self-pay | Admitting: *Deleted

## 2015-09-23 ENCOUNTER — Other Ambulatory Visit: Payer: Self-pay | Admitting: Family Medicine

## 2015-09-23 DIAGNOSIS — R197 Diarrhea, unspecified: Secondary | ICD-10-CM

## 2015-09-23 LAB — GI PROFILE, STOOL, PCR
ASTROVIRUS: NOT DETECTED
Adenovirus F 40/41: NOT DETECTED
C DIFFICILE TOXIN A/B: DETECTED — AB
CYCLOSPORA CAYETANENSIS: NOT DETECTED
Campylobacter: NOT DETECTED
Cryptosporidium: NOT DETECTED
ENTEROPATHOGENIC E COLI: NOT DETECTED
Entamoeba histolytica: NOT DETECTED
Enteroaggregative E coli: NOT DETECTED
Enterotoxigenic E coli: NOT DETECTED
Giardia lamblia: NOT DETECTED
Norovirus GI/GII: NOT DETECTED
PLESIOMONAS SHIGELLOIDES: NOT DETECTED
ROTAVIRUS A: NOT DETECTED
SALMONELLA: NOT DETECTED
SAPOVIRUS: NOT DETECTED
SHIGA-TOXIN-PRODUCING E COLI: NOT DETECTED
SHIGELLA/ENTEROINVASIVE E COLI: NOT DETECTED
VIBRIO: NOT DETECTED
Vibrio cholerae: NOT DETECTED
Yersinia enterocolitica: NOT DETECTED

## 2015-09-23 LAB — OVA AND PARASITE EXAMINATION

## 2015-09-23 MED ORDER — METRONIDAZOLE 500 MG PO TABS: 500.0000 mg | ORAL_TABLET | Freq: Three times a day (TID) | ORAL | Status: DC

## 2015-09-25 ENCOUNTER — Other Ambulatory Visit: Payer: Self-pay | Admitting: Family Medicine

## 2015-09-25 ENCOUNTER — Telehealth: Payer: Self-pay | Admitting: Family Medicine

## 2015-09-25 NOTE — Telephone Encounter (Signed)
Pt awrae

## 2015-09-26 ENCOUNTER — Other Ambulatory Visit: Payer: Self-pay | Admitting: Family Medicine

## 2015-09-26 LAB — CDIFF NAA+O+P+STOOL CULTURE: Toxigenic C. Difficile by PCR: NEGATIVE

## 2015-09-26 LAB — SPECIMEN STATUS REPORT

## 2015-09-28 ENCOUNTER — Telehealth: Payer: Self-pay | Admitting: Family Medicine

## 2015-09-28 ENCOUNTER — Encounter: Payer: Self-pay | Admitting: Family Medicine

## 2015-09-30 ENCOUNTER — Telehealth: Payer: Self-pay | Admitting: Family Medicine

## 2015-10-01 ENCOUNTER — Ambulatory Visit
Admission: RE | Admit: 2015-10-01 | Discharge: 2015-10-01 | Disposition: A | Discharge status: Home / Self Care | Payer: BLUE CROSS/BLUE SHIELD | Source: Ambulatory Visit | Attending: Family Medicine | Admitting: Family Medicine

## 2015-10-01 DIAGNOSIS — M79609 Pain in unspecified limb: Secondary | ICD-10-CM

## 2015-10-01 DIAGNOSIS — R202 Paresthesia of skin: Principal | ICD-10-CM

## 2015-10-01 MED ORDER — GADOBENATE DIMEGLUMINE 529 MG/ML IV SOLN
20.0000 mL | Freq: Once | INTRAVENOUS | Status: AC | PRN
  Administered 2015-10-01: 20 mL via INTRAVENOUS

## 2015-10-01 MED ORDER — CYCLOBENZAPRINE HCL 10 MG PO TABS: 20.0000 mg | ORAL_TABLET | Freq: Two times a day (BID) | ORAL | Status: DC

## 2015-10-01 NOTE — Telephone Encounter (Signed)
Please review and advise.

## 2015-10-01 NOTE — Telephone Encounter (Signed)
I increased her dose to the maximum allowed amount. That is 20 mg twice daily. She may have increased drowsiness so monitor for side effects.

## 2015-10-03 ENCOUNTER — Other Ambulatory Visit: Payer: Self-pay | Admitting: Family Medicine

## 2015-10-03 DIAGNOSIS — M7582 Other shoulder lesions, left shoulder: Secondary | ICD-10-CM

## 2015-10-03 DIAGNOSIS — M67412 Ganglion, left shoulder: Secondary | ICD-10-CM

## 2015-10-05 ENCOUNTER — Other Ambulatory Visit: Payer: Self-pay | Admitting: Family Medicine

## 2015-10-05 DIAGNOSIS — K591 Functional diarrhea: Secondary | ICD-10-CM

## 2015-10-06 ENCOUNTER — Ambulatory Visit (INDEPENDENT_AMBULATORY_CARE_PROVIDER_SITE_OTHER): Payer: BLUE CROSS/BLUE SHIELD | Admitting: Family Medicine

## 2015-10-06 ENCOUNTER — Encounter: Payer: Self-pay | Admitting: Family Medicine

## 2015-10-06 ENCOUNTER — Telehealth: Payer: Self-pay | Admitting: Family Medicine

## 2015-10-06 VITALS — BP 132/88 | HR 101 | Temp 98.3°F | Ht 68.0 in | Wt 210.4 lb

## 2015-10-06 DIAGNOSIS — F32A Depression, unspecified: Secondary | ICD-10-CM

## 2015-10-06 DIAGNOSIS — F329 Major depressive disorder, single episode, unspecified: Secondary | ICD-10-CM | POA: Diagnosis not present

## 2015-10-06 DIAGNOSIS — M6740 Ganglion, unspecified site: Secondary | ICD-10-CM | POA: Diagnosis not present

## 2015-10-06 DIAGNOSIS — M7582 Other shoulder lesions, left shoulder: Secondary | ICD-10-CM

## 2015-10-06 DIAGNOSIS — R197 Diarrhea, unspecified: Secondary | ICD-10-CM

## 2015-10-06 MED ORDER — MOMETASONE FUROATE 0.1 % EX SOLN: CUTANEOUS | Status: DC

## 2015-10-06 MED ORDER — METRONIDAZOLE 500 MG PO TABS: 500.0000 mg | ORAL_TABLET | Freq: Three times a day (TID) | ORAL | Status: DC

## 2015-10-06 MED ORDER — QSYMIA 15-92 MG PO CP24: 1.0000 | ORAL_CAPSULE | Freq: Every day | ORAL | Status: DC

## 2015-10-06 MED ORDER — ZIPRASIDONE HCL 80 MG PO CAPS: 80.0000 mg | ORAL_CAPSULE | Freq: Every day | ORAL | Status: DC

## 2015-10-06 NOTE — Telephone Encounter (Signed)
Patient requesting refill on elocon cream.

## 2015-10-06 NOTE — Addendum Note (Signed)
Addended by: Claretta Fraise on: 10/06/2015 09:21 PM   Modules accepted: Orders

## 2015-10-06 NOTE — Progress Notes (Signed)
Subjective:  Patient ID: Aimee [name], female    DOB: 08/30/1971  Age: 45 y.o. MRN: [id number]  CC: Depression and Diarrhea   HPI Kang Ishida presents for ongoing LBM 4+ daily, watery. Moderately severe. Stool specimen positive for C.Diff. On metronidazole for  9 days without improvement. Feels a little weak. Able to hold down P.O. Depression remitting since starting the geodon. Feels more anxious though.    History Aimee [name] has a past medical history of Diabetes mellitus and Hypertriglyceridemia.   She has past surgical history that includes Abdominoplasty (2006); Inner ear surgery; Thoracic disc surgery (2011, 2012); Abdominal hysterectomy (2010); and Cesarean section (1991, 1995).   Her family history includes Cancer in her maternal aunt and maternal grandmother; Diabetes in her father; GI problems in her mother; Heart attack in her maternal grandfather, paternal grandfather, paternal grandmother, and paternal uncle.She reports that she has been smoking Cigarettes.  She has a 23 pack-year smoking history. She does not have any smokeless tobacco history on file. She reports that she does not drink alcohol or use illicit drugs.    ROS Review of Systems  Constitutional: Negative for fever, activity change and appetite change.  HENT: Negative for congestion, rhinorrhea and sore throat.   Eyes: Negative for visual disturbance.  Respiratory: Negative for cough and shortness of breath.   Cardiovascular: Negative for chest pain and palpitations.  Gastrointestinal: Positive for diarrhea and abdominal distention. Negative for nausea and abdominal pain.  Genitourinary: Negative for dysuria.  Musculoskeletal: Positive for myalgias, back pain and arthralgias.  Neurological: Negative for dizziness and seizures.    Objective:  BP 132/88 mmHg  Pulse 101  Temp(Src) 98.3 F (36.8 C) (Oral)  Ht 5' 8"  (1.727 m)  Wt 210 lb 6.4 oz (95.437 kg)  BMI 32.00 kg/m2  SpO2 98%  BP Readings from  Last 3 Encounters:  10/06/15 132/88  09/21/15 125/89  09/10/15 127/91    Wt Readings from Last 3 Encounters:  10/06/15 210 lb 6.4 oz (95.437 kg)  10/01/15 206 lb (93.441 kg)  09/21/15 205 lb 12.8 oz (93.35 kg)     Physical Exam   Lab Results  Component Value Date   WBC 9.8 09/21/2015   HGB 15.1* 09/16/2009   HCT 41.4 09/21/2015   PLT 268 09/21/2015   GLUCOSE 95 09/21/2015   CHOL 185 08/19/2015   TRIG 105 08/19/2015   HDL 52 08/19/2015   LDLCALC 112* 08/19/2015   ALT 20 09/21/2015   AST 17 09/21/2015   NA 145* 09/21/2015   K 4.2 09/21/2015   CL 106 09/21/2015   CREATININE 0.67 09/21/2015   BUN 14 09/21/2015   CO2 26 09/21/2015   TSH 2.370 06/26/2014   HGBA1C 5.3 08/19/2015   MICROALBUR 0.74 07/25/2013    Mr Brain W Wo Contrast  10/01/2015  CLINICAL DATA:  Pain and paresthesias left extremity. EXAM: MRI HEAD WITHOUT AND WITH CONTRAST TECHNIQUE: Multiplanar, multiecho pulse sequences of the brain and surrounding structures were obtained without and with intravenous contrast. CONTRAST:  35m MULTIHANCE GADOBENATE DIMEGLUMINE 529 MG/ML IV SOLN COMPARISON:  MRI 06/18/2015 FINDINGS: External magnet from the right parietal bone conducting hearing aid was removed. Nevertheless there remains significant artifact similar to the prior MRI. This obscures much of the right cerebellum and right parietal lobe. Ventricle size is normal.  Cerebral volume is normal. Scattered small subcortical white matter hyperintensities bilaterally unchanged. Basal ganglia and brainstem normal. Diffusion-weighted imaging is obscured by metallic artifact. Allowing for this no acute infarct identified.  Partially empty sella. Normal orbit. Mild mucosal edema in the paranasal sinuses Negative for mass lesion. Postcontrast imaging reveals normal enhancement. No enhancing mass identified. Vascular enhancement is normal. IMPRESSION: No acute abnormality and no change from the prior study. Scattered small  subcortical white matter hyperintensities unchanged. These may be related to chronic microvascular ischemia, migraine headaches, vasculitis, or chronic inflammatory process. Distribution not typical for multiple sclerosis. Electronically Signed   By: Franchot Gallo M.D.   On: 10/01/2015 18:53   Mr Shoulder Left Wo Contrast  10/02/2015  CLINICAL DATA:  Left shoulder pain with left arm numbness EXAM: MRI OF THE LEFT SHOULDER WITHOUT CONTRAST TECHNIQUE: Multiplanar, multisequence MR imaging of the shoulder was performed. No intravenous contrast was administered. COMPARISON:  None. FINDINGS: Rotator cuff: Mild tendinosis of the supraspinatus tendon without a discrete tear. Infraspinatus tendon is intact. Teres minor tendon is intact. Subscapularis tendon is intact. Muscles: No atrophy or fatty replacement of nor abnormal signal within, the muscles of the rotator cuff. Small cystic structure deep to the subscapularis muscle measuring 7 x 19 mm likely reflecting a ganglion cyst. Biceps long head:  Intact. Acromioclavicular Joint: Mild degenerative changes of the acromioclavicular joint. Type I acromion. No subacromial/ subdeltoid bursal fluid. Glenohumeral Joint: No joint effusion.  No chondral defect. Labrum: Grossly intact, but evaluation is limited by lack of intraarticular fluid. Bones:  No marrow signal abnormality.  No fracture or dislocation. IMPRESSION: 1. Mild tendinosis of the supraspinatus tendon without a discrete tear. 2. Small cystic structure deep to the subscapularis muscle measuring 7 x 19 mm likely reflecting a ganglion cyst. Electronically Signed   By: Kathreen Devoid   On: 10/02/2015 08:07    Assessment & Plan:   Aimee [name] was seen today for depression and diarrhea.  Diagnoses and all orders for this visit:  Diarrhea, unspecified type -     metroNIDAZOLE (FLAGYL) 500 MG tablet; Take 1 tablet (500 mg total) by mouth 3 (three) times daily. -     Ambulatory referral to  Gastroenterology  Depression -     ziprasidone (GEODON) 80 MG capsule; Take 1 capsule (80 mg total) by mouth daily with supper.  Tendonitis of left rotator cuff  Ganglion cyst  Other orders -     QSYMIA 15-92 MG CP24; Take 1 capsule by mouth daily before breakfast.    Improving depression. Diarrhea unchecked. C. Diff noted present on stool specimen.   I have discontinued Ms. Mctighe's ciprofloxacin. I have also changed her ziprasidone and QSYMIA. Additionally, I am having her maintain her cyanocobalamin, mometasone, HUMIRA PEN-CROHNS STARTER, diclofenac sodium, omega-3 acid ethyl esters, gabapentin, DULoxetine, diclofenac, cyclobenzaprine, buprenorphine, PROVENTIL HFA, and metroNIDAZOLE.  Meds ordered this encounter  Medications  . buprenorphine (BUTRANS) 10 MCG/HR PTWK patch    Sig:   . PROVENTIL HFA 108 (90 Base) MCG/ACT inhaler    Sig:     Refill:  1  . metroNIDAZOLE (FLAGYL) 500 MG tablet    Sig: Take 1 tablet (500 mg total) by mouth 3 (three) times daily.    Dispense:  21 tablet    Refill:  0  . ziprasidone (GEODON) 80 MG capsule    Sig: Take 1 capsule (80 mg total) by mouth daily with supper.    Dispense:  90 capsule    Refill:  1  . QSYMIA 15-92 MG CP24    Sig: Take 1 capsule by mouth daily before breakfast.    Dispense:  30 capsule    Refill:  2  Follow-up: Return in about 1 month (around 11/06/2015) for Depression, diarrhea.  Claretta Fraise, M.D.

## 2015-10-06 NOTE — Telephone Encounter (Signed)
Sccrip sent. Please tell pt.

## 2015-10-07 ENCOUNTER — Encounter: Payer: Self-pay | Admitting: Physician Assistant

## 2015-10-07 NOTE — Telephone Encounter (Signed)
Left detailed message stating rx sent and to CB with any further questions or concerns.

## 2015-10-08 ENCOUNTER — Telehealth: Payer: Self-pay

## 2015-10-08 ENCOUNTER — Other Ambulatory Visit: Payer: Self-pay

## 2015-10-08 DIAGNOSIS — F32A Depression, unspecified: Secondary | ICD-10-CM

## 2015-10-08 DIAGNOSIS — F329 Major depressive disorder, single episode, unspecified: Secondary | ICD-10-CM

## 2015-10-08 MED ORDER — ZIPRASIDONE HCL 80 MG PO CAPS: 80.0000 mg | ORAL_CAPSULE | Freq: Every day | ORAL | Status: DC

## 2015-10-08 MED ORDER — CYANOCOBALAMIN 1000 MCG/ML IJ SOLN: 1000.0000 ug | INTRAMUSCULAR | Status: DC

## 2015-10-08 MED ORDER — GABAPENTIN 600 MG PO TABS: ORAL_TABLET | ORAL | Status: DC

## 2015-10-08 MED ORDER — DICLOFENAC SODIUM 75 MG PO TBEC: DELAYED_RELEASE_TABLET | ORAL | Status: DC

## 2015-10-08 MED ORDER — DICLOFENAC SODIUM 1 % TD GEL: 4.0000 g | Freq: Four times a day (QID) | TRANSDERMAL | Status: DC

## 2015-10-08 MED ORDER — DULOXETINE HCL 60 MG PO CPEP: 60.0000 mg | ORAL_CAPSULE | Freq: Two times a day (BID) | ORAL | Status: DC

## 2015-10-08 MED ORDER — OMEGA-3-ACID ETHYL ESTERS 1 G PO CAPS: ORAL_CAPSULE | ORAL | Status: DC

## 2015-10-08 NOTE — Telephone Encounter (Signed)
Use diabetes, obesity

## 2015-10-08 NOTE — Telephone Encounter (Signed)
Insurance wanting prior auth for Qsymia    Don't see a DX???

## 2015-10-12 ENCOUNTER — Encounter: Payer: Self-pay | Admitting: Family Medicine

## 2015-10-13 ENCOUNTER — Telehealth: Payer: Self-pay | Admitting: Family Medicine

## 2015-10-13 ENCOUNTER — Telehealth: Payer: Self-pay

## 2015-10-13 DIAGNOSIS — A0472 Enterocolitis due to Clostridium difficile, not specified as recurrent: Secondary | ICD-10-CM

## 2015-10-13 NOTE — Telephone Encounter (Signed)
[facility] [place] in [place] does that. Yes she needs stool for c dif

## 2015-10-13 NOTE — Telephone Encounter (Signed)
Insurance prior authorized Qsymia

## 2015-10-14 HISTORY — PX: SHOULDER ARTHROSCOPY: SHX128

## 2015-10-14 NOTE — Telephone Encounter (Signed)
Patient aware and stool culture left up front for pick up

## 2015-10-15 ENCOUNTER — Telehealth: Payer: Self-pay | Admitting: Family Medicine

## 2015-10-15 ENCOUNTER — Other Ambulatory Visit: Payer: BLUE CROSS/BLUE SHIELD

## 2015-10-15 DIAGNOSIS — A0472 Enterocolitis due to Clostridium difficile, not specified as recurrent: Secondary | ICD-10-CM

## 2015-10-15 NOTE — Progress Notes (Signed)
Lab only 

## 2015-10-15 NOTE — Addendum Note (Signed)
Addended by: Pollyann Kennedy F on: 10/15/2015 03:04 PM   Modules accepted: Orders

## 2015-10-16 LAB — CLOSTRIDIUM DIFFICILE BY PCR: CDIFFPCR: NEGATIVE

## 2015-10-16 NOTE — Telephone Encounter (Signed)
Pt wants colonoscopy at [facility] in [facility] She wants to go where they will do consult and procedure at same appt Please reschedule

## 2015-10-20 ENCOUNTER — Telehealth: Payer: Self-pay | Admitting: Family Medicine

## 2015-10-20 MED ORDER — QSYMIA 15-92 MG PO CP24: 1.0000 | ORAL_CAPSULE | Freq: Every day | ORAL | Status: DC

## 2015-10-20 NOTE — Telephone Encounter (Signed)
I sent a prescription in to [facility]. If it needs to be actually called in, please do so using the scrip I just sent. Thanks, WS

## 2015-10-20 NOTE — Telephone Encounter (Signed)
rx called into [facility] and pt is aware.

## 2015-10-22 ENCOUNTER — Encounter: Payer: Self-pay | Admitting: Family Medicine

## 2015-10-22 ENCOUNTER — Other Ambulatory Visit: Payer: Self-pay | Admitting: Orthopaedic Surgery

## 2015-10-22 DIAGNOSIS — M25512 Pain in left shoulder: Principal | ICD-10-CM

## 2015-10-22 DIAGNOSIS — G8929 Other chronic pain: Secondary | ICD-10-CM

## 2015-10-26 ENCOUNTER — Ambulatory Visit
Admission: RE | Admit: 2015-10-26 | Discharge: 2015-10-26 | Disposition: A | Discharge status: Home / Self Care | Payer: BLUE CROSS/BLUE SHIELD | Source: Ambulatory Visit | Attending: Orthopaedic Surgery | Admitting: Orthopaedic Surgery

## 2015-10-26 ENCOUNTER — Other Ambulatory Visit: Payer: Self-pay | Admitting: Neurosurgery

## 2015-10-26 DIAGNOSIS — G8929 Other chronic pain: Secondary | ICD-10-CM

## 2015-10-26 DIAGNOSIS — M25512 Pain in left shoulder: Principal | ICD-10-CM

## 2015-10-26 DIAGNOSIS — M542 Cervicalgia: Secondary | ICD-10-CM

## 2015-10-26 MED ORDER — METHYLPREDNISOLONE ACETATE 40 MG/ML INJ SUSP (RADIOLOG: 120.0000 mg | Freq: Once | INTRAMUSCULAR | Status: DC

## 2015-10-26 MED ORDER — IOHEXOL 180 MG/ML  SOLN: 1.0000 mL | Freq: Once | INTRAMUSCULAR | Status: DC | PRN

## 2015-10-28 ENCOUNTER — Other Ambulatory Visit: Payer: BLUE CROSS/BLUE SHIELD

## 2015-10-28 ENCOUNTER — Ambulatory Visit
Admission: RE | Admit: 2015-10-28 | Discharge: 2015-10-28 | Disposition: A | Discharge status: Home / Self Care | Payer: BLUE CROSS/BLUE SHIELD | Source: Ambulatory Visit | Attending: Neurosurgery | Admitting: Neurosurgery

## 2015-10-28 DIAGNOSIS — M542 Cervicalgia: Secondary | ICD-10-CM

## 2015-11-02 ENCOUNTER — Ambulatory Visit: Payer: BLUE CROSS/BLUE SHIELD | Admitting: Physician Assistant

## 2015-11-03 ENCOUNTER — Encounter: Payer: Self-pay | Admitting: Family Medicine

## 2015-11-09 DIAGNOSIS — Z029 Encounter for administrative examinations, unspecified: Secondary | ICD-10-CM

## 2015-11-27 ENCOUNTER — Other Ambulatory Visit: Payer: Self-pay | Admitting: Family Medicine

## 2015-11-27 ENCOUNTER — Encounter: Payer: Self-pay | Admitting: Family Medicine

## 2015-11-27 DIAGNOSIS — M5090 Cervical disc disorder, unspecified, unspecified cervical region: Secondary | ICD-10-CM

## 2015-12-01 NOTE — Telephone Encounter (Signed)
If pt. Desires she can have a LUE NCV performed. If pt. Agrees, please order. WS

## 2015-12-04 ENCOUNTER — Other Ambulatory Visit: Payer: Self-pay | Admitting: Family Medicine

## 2016-01-16 ENCOUNTER — Encounter: Payer: Self-pay | Admitting: Family Medicine

## 2016-02-01 DIAGNOSIS — M533 Sacrococcygeal disorders, not elsewhere classified: Secondary | ICD-10-CM | POA: Diagnosis not present

## 2016-02-15 ENCOUNTER — Encounter: Payer: Self-pay | Admitting: Physician Assistant

## 2016-02-15 ENCOUNTER — Ambulatory Visit (INDEPENDENT_AMBULATORY_CARE_PROVIDER_SITE_OTHER): Payer: BLUE CROSS/BLUE SHIELD | Admitting: Physician Assistant

## 2016-02-15 VITALS — BP 144/95 | HR 89 | Temp 98.4°F | Ht 68.0 in | Wt 207.5 lb

## 2016-02-15 DIAGNOSIS — H00013 Hordeolum externum right eye, unspecified eyelid: Secondary | ICD-10-CM | POA: Diagnosis not present

## 2016-02-15 DIAGNOSIS — R030 Elevated blood-pressure reading, without diagnosis of hypertension: Secondary | ICD-10-CM

## 2016-02-15 DIAGNOSIS — IMO0001 Reserved for inherently not codable concepts without codable children: Secondary | ICD-10-CM

## 2016-02-15 MED ORDER — TOBRAMYCIN 0.3 % OP SOLN: 2.0000 [drp] | Freq: Four times a day (QID) | OPHTHALMIC | Status: DC

## 2016-02-15 NOTE — Patient Instructions (Signed)
Stye A stye is a bump on your eyelid caused by a bacterial infection. A stye can form inside the eyelid (internal stye) or outside the eyelid (external stye). An internal stye may be caused by an infected oil-producing gland inside your eyelid. An external stye may be caused by an infection at the base of your eyelash (hair follicle). Styes are very common. Anyone can get them at any age. They usually occur in just one eye, but you may have more than one in either eye.  CAUSES  The infection is almost always caused by bacteria called Staphylococcus aureus. This is a common type of bacteria that lives on your skin. RISK FACTORS You may be at higher risk for a stye if you have had one before. You may also be at higher risk if you have:  Diabetes.  Long-term illness.  Long-term eye redness.  A skin condition called seborrhea.  High fat levels in your blood (lipids). SIGNS AND SYMPTOMS  Eyelid pain is the most common symptom of a stye. Internal styes are more painful than external styes. Other signs and symptoms may include:  Painful swelling of your eyelid.  A scratchy feeling in your eye.  Tearing and redness of your eye.  Pus draining from the stye. DIAGNOSIS  Your health care provider may be able to diagnose a stye just by examining your eye. The health care provider may also check to make sure:  You do not have a fever or other signs of a more serious infection.  The infection has not spread to other parts of your eye or areas around your eye. TREATMENT  Most styes will clear up in a few days without treatment. In some cases, you may need to use antibiotic drops or ointment to prevent infection. Your health care provider may have to drain the stye surgically if your stye is:  Large.  Causing a lot of pain.  Interfering with your vision. This can be done using a thin blade or a needle.  HOME CARE INSTRUCTIONS   Take medicines only as directed by your health care  provider.  Apply a clean, warm compress to your eye for 10 minutes, 4 times a day.  Do not wear contact lenses or eye makeup until your stye has healed.  Do not try to pop or drain the stye. SEEK MEDICAL CARE IF:  You have chills or a fever.  Your stye does not go away after several days.  Your stye affects your vision.  Your eyeball becomes swollen, red, or painful. MAKE SURE YOU:  Understand these instructions.  Will watch your condition.  Will get help right away if you are not doing well or get worse.   This information is not intended to replace advice given to you by your health care provider. Make sure you discuss any questions you have with your health care provider.   Document Released: 06/08/2005 Document Revised: 09/19/2014 Document Reviewed: 12/13/2013 Elsevier Interactive Patient Education 2016 [employer]  

## 2016-02-15 NOTE — Progress Notes (Signed)
Subjective:     Patient ID: Aimee [name], female   DOB: 07-18-1971, 45 y.o.   MRN: [id number]  HPI Pt with stye to the R eye Area started draining this am No change in vision She would also like to discuss her elevated BP Sig FH of same PMH of elevated BS but has controlled this with diet She has noted recent increase of BP, hypersom, and always feeling hot No change in her regular meds  Review of Systems  Constitutional: Positive for diaphoresis, activity change and fatigue. Negative for chills and appetite change.  HENT: Negative.   Respiratory: Negative.   Cardiovascular: Negative.        Objective:   Physical Exam Vitals reviewed PERRLA EOMI No lid edema No periorbital erythema No drainage noted Sl lateral subconj hemorrhage    Assessment:     1. Stye external, right   2. Elevated BP        Plan:     Tobrex rx Will go ahead with labs today due to elevated BP readings  Will inform of lab results F/U with her regular provider following lab results

## 2016-02-17 ENCOUNTER — Telehealth: Payer: Self-pay | Admitting: *Deleted

## 2016-02-17 NOTE — Telephone Encounter (Signed)
Patient aware of results.

## 2016-02-20 ENCOUNTER — Encounter: Payer: Self-pay | Admitting: Family Medicine

## 2016-02-23 ENCOUNTER — Encounter: Payer: Self-pay | Admitting: Family Medicine

## 2016-03-23 LAB — CMP14+EGFR
A/G RATIO: 1.5 (ref 1.2–2.2)
ALT: 17 IU/L (ref 0–32)
AST: 11 IU/L (ref 0–40)
Albumin: 3.9 g/dL (ref 3.5–5.5)
Alkaline Phosphatase: 44 IU/L (ref 39–117)
BILIRUBIN TOTAL: 0.3 mg/dL (ref 0.0–1.2)
BUN / CREAT RATIO: 28 — AB (ref 9–23)
BUN: 19 mg/dL (ref 6–24)
CHLORIDE: 103 mmol/L (ref 96–106)
CO2: 21 mmol/L (ref 18–29)
Calcium: 8.9 mg/dL (ref 8.7–10.2)
Creatinine, Ser: 0.68 mg/dL (ref 0.57–1.00)
GFR calc non Af Amer: 106 mL/min/{1.73_m2} (ref >59)
GFR, EST AFRICAN AMERICAN: 122 mL/min/{1.73_m2} (ref >59)
Globulin, Total: 2.6 g/dL (ref 1.5–4.5)
Glucose: 102 mg/dL — ABNORMAL HIGH (ref 65–99)
POTASSIUM: 4.1 mmol/L (ref 3.5–5.2)
Sodium: 141 mmol/L (ref 134–144)
TOTAL PROTEIN: 6.5 g/dL (ref 6.0–8.5)

## 2016-03-23 LAB — TSH: TSH: 0.631 u[IU]/mL (ref 0.450–4.500)

## 2016-04-22 ENCOUNTER — Encounter: Payer: Self-pay | Admitting: Family Medicine

## 2016-04-22 ENCOUNTER — Ambulatory Visit (INDEPENDENT_AMBULATORY_CARE_PROVIDER_SITE_OTHER): Payer: BLUE CROSS/BLUE SHIELD | Admitting: Family Medicine

## 2016-04-22 VITALS — BP 151/96 | HR 85 | Temp 98.3°F | Ht 68.0 in | Wt 209.4 lb

## 2016-04-22 DIAGNOSIS — M51369 Other intervertebral disc degeneration, lumbar region without mention of lumbar back pain or lower extremity pain: Secondary | ICD-10-CM

## 2016-04-22 DIAGNOSIS — A0472 Enterocolitis due to Clostridium difficile, not specified as recurrent: Secondary | ICD-10-CM

## 2016-04-22 DIAGNOSIS — M5136 Other intervertebral disc degeneration, lumbar region: Secondary | ICD-10-CM

## 2016-04-22 DIAGNOSIS — I10 Essential (primary) hypertension: Secondary | ICD-10-CM

## 2016-04-22 DIAGNOSIS — A047 Enterocolitis due to Clostridium difficile: Secondary | ICD-10-CM

## 2016-04-22 MED ORDER — PREDNISONE 20 MG PO TABS: ORAL_TABLET | ORAL | 0 refills | Status: DC

## 2016-04-22 MED ORDER — METRONIDAZOLE 500 MG PO TABS: 500.0000 mg | ORAL_TABLET | Freq: Two times a day (BID) | ORAL | 0 refills | Status: DC

## 2016-04-22 MED ORDER — AMLODIPINE BESYLATE 5 MG PO TABS: 5.0000 mg | ORAL_TABLET | Freq: Every day | ORAL | 3 refills | Status: DC

## 2016-04-22 NOTE — Progress Notes (Signed)
BP (!) 151/96 (BP Location: Left Arm, Patient Position: Sitting, Cuff Size: Large)   Pulse 85   Temp 98.3 F (36.8 C) (Oral)   Ht 5' 8"  (1.727 m)   Wt 209 lb 6.4 oz (95 kg)   SpO2 97%   BMI 31.84 kg/m    Subjective:    Patient ID: Aimee [name], female    DOB: June 02, 1971, 45 y.o.   MRN: [id number]  HPI: Aimee [name] is a 45 y.o. female presenting on 04/22/2016 for Hypertension (elevated readings at home)   HPI Hypertension Patient coming in for a hypertension visit. Her blood pressure here is 151/96 and she says that she has been getting levels that are that high or higher at home as well. She denies any chest pain or headaches or blurred vision or shortness of breath. She has not been on any medication for blood pressure.  Degenerative disc disease Per patient she has been told previously that she has known general disc disease and few has been improving on the Voltaren and Flexeril. She denies any tingling or numbness going down either arm or either leg. The pain that she has is mostly low lumbar and achy in nature  Diarrhea Patient has been having increasing diarrhea over the past couple weeks. She has had C. difficile colitis a few months ago and was treated for it which made it slightly improved but is gradually been getting worse to where she is having watery diarrhea again at least 3-4 times per day. She has been trying to stay hydrated to help deal with this. She denies any fevers or chills. She denies any lightheadedness or dizziness. She is a self-pay patient and cannot afford some of the medications or lab testing but hersymptomsitsoundslikeC. Difficile colitis so we will likely go ahead and treat. She denies any abdominal pain or fevers or chills or blood in her stool  Relevant past medical, surgical, family and social history reviewed and updated as indicated. Interim medical history since our last visit reviewed. Allergies and medications reviewed and updated.  Review  of Systems  Constitutional: Negative for chills and fever.  HENT: Negative for congestion, ear discharge and ear pain.   Eyes: Negative for redness and visual disturbance.  Respiratory: Negative for chest tightness and shortness of breath.   Cardiovascular: Negative for chest pain and leg swelling.  Gastrointestinal: Positive for diarrhea. Negative for abdominal pain, constipation and nausea.  Genitourinary: Negative for difficulty urinating and dysuria.  Musculoskeletal: Negative for back pain and gait problem.  Skin: Negative for rash.  Neurological: Negative for light-headedness and headaches.  Psychiatric/Behavioral: Negative for agitation and behavioral problems.  All other systems reviewed and are negative.   Per HPI unless specifically indicated above     Medication List       Accurate as of 04/22/16  5:02 PM. Always use your most recent med list.          amLODipine 5 MG tablet Commonly known as:  NORVASC Take 1 tablet (5 mg total) by mouth daily.   BUTRANS 10 MCG/HR Ptwk patch Generic drug:  buprenorphine   cyanocobalamin 1000 MCG/ML injection Commonly known as:  (VITAMIN B-12) Inject 1 mL (1,000 mcg total) into the muscle every 30 (thirty) days.   diclofenac 75 MG EC tablet Commonly known as:  VOLTAREN Take by mouth. Take one tablet by mouth 2 times daily   diclofenac sodium 1 % Gel Commonly known as:  VOLTAREN Apply 4 g topically 4 (four) times daily.  DULoxetine 60 MG capsule Commonly known as:  CYMBALTA TAKE 1 BY MOUTH TWICE DAILY   gabapentin 600 MG tablet Commonly known as:  NEURONTIN TAKE 1 BY MOUTH IN THE MORNING , 2 IN THE AFTERNOON, 3 AT BEDTIME   HUMIRA PEN-CROHNS STARTER 40 MG/0.8ML Pnkt Generic drug:  Adalimumab   mometasone 0.1 % lotion Commonly known as:  ELOCON APPLY TOPICALLY DAILY.   omega-3 acid ethyl esters 1 g capsule Commonly known as:  LOVAZA TAKE 2 BY MOUTH TWICE DAILY   predniSONE 20 MG tablet Commonly known as:   DELTASONE 2 po at same time daily for 5 days   PROVENTIL HFA 108 (90 Base) MCG/ACT inhaler Generic drug:  albuterol   ZANAFLEX 4 MG tablet Generic drug:  tiZANidine Take 1 tablet by mouth 3 (three) times daily as needed. If this makes you too drowsy during the day you can take 1-3 tabs at night   ziprasidone 80 MG capsule Commonly known as:  GEODON Take 1 capsule (80 mg total) by mouth daily with supper.          Objective:    BP (!) 151/96 (BP Location: Left Arm, Patient Position: Sitting, Cuff Size: Large)   Pulse 85   Temp 98.3 F (36.8 C) (Oral)   Ht 5' 8"  (1.727 m)   Wt 209 lb 6.4 oz (95 kg)   SpO2 97%   BMI 31.84 kg/m   Wt Readings from Last 3 Encounters:  04/22/16 209 lb 6.4 oz (95 kg)  02/15/16 207 lb 8 oz (94.1 kg)  10/06/15 210 lb 6.4 oz (95.4 kg)    Physical Exam  Constitutional: She is oriented to person, place, and time. She appears well-developed and well-nourished. No distress.  Eyes: Conjunctivae and EOM are normal. Pupils are equal, round, and reactive to light.  Neck: Neck supple. No thyromegaly present.  Cardiovascular: Normal rate, regular rhythm, normal heart sounds and intact distal pulses.   No murmur heard. Pulmonary/Chest: Effort normal and breath sounds normal. No respiratory distress. She has no wheezes.  Abdominal: Soft. Bowel sounds are normal. She exhibits no distension. There is no tenderness. There is no rebound.  Musculoskeletal: Normal range of motion. She exhibits no edema or tenderness.  Lymphadenopathy:    She has no cervical adenopathy.  Neurological: She is alert and oriented to person, place, and time. Coordination normal.  Skin: Skin is warm and dry. No rash noted. She is not diaphoretic.  Psychiatric: She has a normal mood and affect. Her behavior is normal.  Nursing note and vitals reviewed.      Assessment & Plan:   Problem List Items Addressed This Visit      Cardiovascular and Mediastinum   Essential hypertension -  Primary   Relevant Medications   amLODipine (NORVASC) 5 MG tablet     Musculoskeletal and Integument   DDD (degenerative disc disease), lumbar   Relevant Medications   predniSONE (DELTASONE) 20 MG tablet    Other Visit Diagnoses    C. difficile colitis       Relevant Medications   metroNIDAZOLE (FLAGYL) 500 MG tablet       Follow up plan: Return in about 4 weeks (around 05/20/2016), or if symptoms worsen or fail to improve, for Hypertension recheck.  Counseling provided for all of the vaccine components No orders of the defined types were placed in this encounter.   Caryl Pina, MD [facility] 04/22/2016, 5:02 PM

## 2016-05-12 ENCOUNTER — Other Ambulatory Visit: Payer: Self-pay | Admitting: Family Medicine

## 2016-05-30 ENCOUNTER — Encounter: Payer: Self-pay | Admitting: Family Medicine

## 2016-05-30 NOTE — Telephone Encounter (Signed)
Some of her question is IT related. PLease forward to whoever can help.   Her antibiotic question: Metronidazole is the antibiotic that can kill the germ causing her infection. It is unique in that respect. WS

## 2016-05-30 NOTE — Telephone Encounter (Signed)
Her last pulmonary function was normal. There is no COPD. However, if she continues to smoke, it will eventually happen to most people. I can not comment on the rest. Apparently someone who knows MyChart better than me will need to assist. WS

## 2016-06-07 ENCOUNTER — Telehealth: Payer: Self-pay | Admitting: Family Medicine

## 2016-06-07 MED ORDER — AMLODIPINE BESYLATE 10 MG PO TABS: 10.0000 mg | ORAL_TABLET | Freq: Every day | ORAL | 3 refills | Status: DC

## 2016-06-07 NOTE — Telephone Encounter (Signed)
Patient aware.

## 2016-06-07 NOTE — Telephone Encounter (Signed)
Increase amlodipine to 10 mg a day. (Please call in - # 30, 2 refill.) It will take several days to kick in. Avoid overly strenuous activity in the meantime

## 2016-06-14 ENCOUNTER — Encounter: Payer: Self-pay | Admitting: Family Medicine

## 2016-06-14 ENCOUNTER — Ambulatory Visit (INDEPENDENT_AMBULATORY_CARE_PROVIDER_SITE_OTHER): Payer: Self-pay | Admitting: Family Medicine

## 2016-06-14 VITALS — BP 124/89 | HR 112 | Temp 97.5°F | Ht 68.0 in | Wt 203.8 lb

## 2016-06-14 DIAGNOSIS — A0472 Enterocolitis due to Clostridium difficile, not specified as recurrent: Secondary | ICD-10-CM

## 2016-06-14 DIAGNOSIS — L049 Acute lymphadenitis, unspecified: Secondary | ICD-10-CM

## 2016-06-14 DIAGNOSIS — K52839 Microscopic colitis, unspecified: Secondary | ICD-10-CM

## 2016-06-14 DIAGNOSIS — I10 Essential (primary) hypertension: Secondary | ICD-10-CM

## 2016-06-14 MED ORDER — CIPROFLOXACIN HCL 500 MG PO TABS: 500.0000 mg | ORAL_TABLET | Freq: Two times a day (BID) | ORAL | 0 refills | Status: DC

## 2016-06-14 MED ORDER — AMLODIPINE BESYLATE 10 MG PO TABS: 10.0000 mg | ORAL_TABLET | Freq: Every day | ORAL | 3 refills | Status: DC

## 2016-06-14 MED ORDER — METRONIDAZOLE 500 MG PO TABS: 500.0000 mg | ORAL_TABLET | Freq: Three times a day (TID) | ORAL | 0 refills | Status: DC

## 2016-06-14 MED ORDER — DICYCLOMINE HCL 20 MG PO TABS: 20.0000 mg | ORAL_TABLET | Freq: Three times a day (TID) | ORAL | 4 refills | Status: DC

## 2016-06-14 NOTE — Progress Notes (Signed)
Subjective:  Patient ID: [name], female    DOB: 10-May-1971  Age: 45 y.o. MRN: [id number]  CC: Nausea (Has been going on since january and no better. pos Cdif in January.); lose stool (Has been going on since january and no better.); and Abdominal Pain (right side down to right side of groin area. x 1 week ago)   HPI Luzmaria Devaux presents for Ongoing diarrhea several bowel movements a day. This was not relieved by the recent treatment with metronidazole. Instead her diarrhea seems to be getting worse. Over the last few days she's developed some swelling in the formal full IN the right inguinal crease. Today she noted them moving into the perineal region as well. Nausea has been moderate and affects her appetite. It has been constant for several months. Of note is that she lost her insurance back in January or February and has not been able to follow up properly her ongoing medical concerns since that time. She has a few Cymbalta left a few gabapentin (a few B Tran's patches. She's been stretching all of her medicine otherwise. She can't afford to have any testing done today. She's not been able to take the Humira for the Crohn's. History Shealeigh has a past medical history of Diabetes mellitus and Hypertriglyceridemia.   She has a past surgical history that includes Abdominoplasty (2006); Inner ear surgery; Thoracic disc surgery (2011, 2012); Abdominal hysterectomy (2010); Cesarean section (1991, 1995); and Shoulder arthroscopy (Left, 10/2015).   Her family history includes Cancer in her maternal aunt and maternal grandmother; Diabetes in her father; GI problems in her mother; Heart attack in her maternal grandfather, paternal grandfather, paternal grandmother, and paternal uncle.She reports that she has been smoking Cigarettes.  She has a 23.00 pack-year smoking history. She has never used smokeless tobacco. She reports that she does not drink alcohol or use drugs.    ROS Review of  Systems  Constitutional: Negative for activity change, appetite change and fever.  HENT: Negative for congestion, rhinorrhea and sore throat.   Eyes: Negative for visual disturbance.  Respiratory: Negative for cough.   Cardiovascular: Negative for chest pain and palpitations.  Gastrointestinal: Positive for abdominal pain, diarrhea and nausea.  Genitourinary: Negative for dysuria.  Musculoskeletal: Negative for arthralgias and myalgias.  Hematological: Positive for adenopathy.    Objective:  BP 124/89   Pulse (!) 112   Temp 97.5 F (36.4 C) (Oral)   Ht 5' 8"  (1.727 m)   Wt 203 lb 12.8 oz (92.4 kg)   BMI 30.99 kg/m   BP Readings from Last 3 Encounters:  06/14/16 124/89  04/22/16 (!) 151/96  02/15/16 (!) 144/95    Wt Readings from Last 3 Encounters:  06/14/16 203 lb 12.8 oz (92.4 kg)  04/22/16 209 lb 6.4 oz (95 kg)  02/15/16 207 lb 8 oz (94.1 kg)     Physical Exam  Constitutional: She is oriented to person, place, and time. She appears well-developed and well-nourished. She appears distressed (distraught from her frustrating situation as well as distressed by her pain).  Neck: Normal range of motion.  Cardiovascular: Normal rate and regular rhythm.   No murmur heard. Abdominal: Soft. She exhibits distension. There is tenderness (diffuse).  Musculoskeletal: Normal range of motion.  Neurological: She is alert and oriented to person, place, and time.  Skin: Skin is warm and dry. No erythema.  There are several enlarged lymph nodes in the inguinal chain on the right. The largest of these measures approximately 1 cm.  It is palpable under the skin but not erythematous. It is quite tender. There are 3 others that are smaller moving posteriorly toward the perineum at the right thigh/groin crease  Psychiatric: Her speech is normal and behavior is normal. Judgment normal. Thought content is paranoid. Cognition and memory are normal. She exhibits a depressed mood.     Lab Results    Component Value Date   WBC 9.8 09/21/2015   HGB 15.1 (H) 09/16/2009   HCT 41.4 09/21/2015   PLT 268 09/21/2015   GLUCOSE 102 (H) 02/15/2016   CHOL 185 08/19/2015   TRIG 105 08/19/2015   HDL 52 08/19/2015   LDLCALC 112 (H) 08/19/2015   ALT 17 02/15/2016   AST 11 02/15/2016   NA 141 02/15/2016   K 4.1 02/15/2016   CL 103 02/15/2016   CREATININE 0.68 02/15/2016   BUN 19 02/15/2016   CO2 21 02/15/2016   TSH 0.631 02/15/2016   HGBA1C 5.3 08/19/2015   MICROALBUR 0.74 07/25/2013    Mr Cervical Spine Wo Contrast  Result Date: 10/29/2015 CLINICAL DATA:  History of cervical fusion with residual pain, increasing after recent injury. Pain extends into the LEFT arm and shoulder. EXAM: MRI CERVICAL SPINE WITHOUT CONTRAST TECHNIQUE: Multiplanar, multisequence MR imaging of the cervical spine was performed. No intravenous contrast was administered. COMPARISON:  CT myelogram of the cervical spine 03/28/2013. FINDINGS: Alignment: Anatomic. Vertebrae: No worrisome osseous lesion. Solid appearing C3 through C6 fusion. Cord: Normal. Posterior Fossa: No tonsillar herniation. Vertebral Arteries: Patent.  LEFT slightly larger. Paraspinal tissues: No masses. Disc levels: The individual disc spaces were examined as follows: C2-3:  Normal. C3-4:  Normal post fusion appearance.  No impingement. C4-5:  Normal post fusion appearance.  No impingement. C5-6:  Normal post fusion appearance.  No impingement. C6-7: Minor annular bulging. Mild to moderate facet arthropathy, worse on the LEFT. This is best seen on T2 sagittal image 11 series 3. No joint effusion or bone marrow edema, but correlate clinically as a possible source of cervicalgia. No impingement. C7-T1: Minor annular bulging. Mild BILATERAL facet arthropathy. Low No impingement. IMPRESSION: Solid appearing C3 through C6 fusion. No significant adjacent segment disc protrusion or spinal stenosis. Asymmetric facet arthropathy at C6-7 on the LEFT. Correlate  clinically as a possible source of cervicalgia. Electronically Signed   By: Staci Righter M.D.   On: 10/29/2015 08:20    Assessment & Plan:   Nura was seen today for nausea, lose stool and abdominal pain.  Diagnoses and all orders for this visit:  Microscopic colitis, unspecified microscopic colitis type  C. difficile colitis  Acute adenitis  Essential hypertension  Other orders -     metroNIDAZOLE (FLAGYL) 500 MG tablet; Take 1 tablet (500 mg total) by mouth 3 (three) times daily. -     ciprofloxacin (CIPRO) 500 MG tablet; Take 1 tablet (500 mg total) by mouth 2 (two) times daily. -     dicyclomine (BENTYL) 20 MG tablet; Take 1 tablet (20 mg total) by mouth 4 (four) times daily -  before meals and at bedtime. -     amLODipine (NORVASC) 10 MG tablet; Take 1 tablet (10 mg total) by mouth daily.    This patient's very unfortunate social situation has led to inability to cover her medications or testing or referral as have been recommended over the months. She does not have several of her medications and is about to run over out of others. She is having intractable back pain due to  not being able to afford the treatments for her back through nerve ablation and/or steroid injection to her back. She has not been able to see GI regarding her chronic colitis/diarrhea syndrome. She has applied for Medicaid. She just needs a note stating that she is unable to work and why.  I have discontinued Ms. Campion's metroNIDAZOLE and predniSONE. I am also having her start on metroNIDAZOLE, ciprofloxacin, and dicyclomine. Additionally, I am having her maintain her HUMIRA PEN-CROHNS STARTER, buprenorphine, PROVENTIL HFA, mometasone, diclofenac sodium, ziprasidone, gabapentin, DULoxetine, omega-3 acid ethyl esters, tiZANidine, cyanocobalamin, and amLODipine.  Meds ordered this encounter  Medications  . metroNIDAZOLE (FLAGYL) 500 MG tablet    Sig: Take 1 tablet (500 mg total) by mouth 3 (three) times  daily.    Dispense:  42 tablet    Refill:  0  . ciprofloxacin (CIPRO) 500 MG tablet    Sig: Take 1 tablet (500 mg total) by mouth 2 (two) times daily.    Dispense:  20 tablet    Refill:  0  . dicyclomine (BENTYL) 20 MG tablet    Sig: Take 1 tablet (20 mg total) by mouth 4 (four) times daily -  before meals and at bedtime.    Dispense:  60 tablet    Refill:  4  . amLODipine (NORVASC) 10 MG tablet    Sig: Take 1 tablet (10 mg total) by mouth daily.    Dispense:  90 tablet    Refill:  3     Follow-up: Return in about 1 month (around 07/15/2016).  Claretta Fraise, M.D.

## 2016-06-15 ENCOUNTER — Telehealth: Payer: Self-pay | Admitting: Family Medicine

## 2016-06-15 ENCOUNTER — Ambulatory Visit: Payer: Self-pay | Admitting: Family Medicine

## 2016-06-15 NOTE — Telephone Encounter (Signed)
Tried to contact patient and no answer.

## 2016-06-16 NOTE — Telephone Encounter (Signed)
Pt is needing letter written for [employer] that was discussed during her visit the evening of 10/3 this week. Please see original TC notes as to where to fax this letter to

## 2016-06-16 NOTE — Telephone Encounter (Signed)
It is written, but needs to be transferred onto letterhead. It is in my outbox. See if LouAnne can finalize it for me. Thanks, WS

## 2016-06-20 NOTE — Telephone Encounter (Signed)
Letter will be faxed per office manager to Ms West Carbo at [place].

## 2016-06-20 NOTE — Telephone Encounter (Signed)
LuAnne is typing now

## 2016-06-23 ENCOUNTER — Telehealth: Payer: Self-pay | Admitting: Family Medicine

## 2016-06-23 ENCOUNTER — Other Ambulatory Visit: Payer: Self-pay | Admitting: Family Medicine

## 2016-06-23 NOTE — Telephone Encounter (Signed)
done

## 2016-06-24 ENCOUNTER — Telehealth: Payer: Self-pay | Admitting: Family Medicine

## 2016-06-24 ENCOUNTER — Other Ambulatory Visit: Payer: Self-pay | Admitting: Family Medicine

## 2016-06-24 MED ORDER — SERTRALINE HCL 100 MG PO TABS: 100.0000 mg | ORAL_TABLET | Freq: Every day | ORAL | 3 refills | Status: DC

## 2016-06-24 NOTE — Telephone Encounter (Signed)
Please advise on cheaper medication.

## 2016-06-24 NOTE — Telephone Encounter (Signed)
Pt is having withdrawals due to being off of Cymbalta x 3 days.  Sxs include nausea, aggressive thoughts, chest pain, no appetite and shakiness.   RX is $350.  Pt can not afford. Please advise

## 2016-06-24 NOTE — Telephone Encounter (Signed)
Dr [name] sent in [place] to help with the symptoms and pt is aware.

## 2016-06-24 NOTE — Telephone Encounter (Signed)
zoloft should prevent withdrawal

## 2016-06-26 ENCOUNTER — Other Ambulatory Visit: Payer: Self-pay | Admitting: Family Medicine

## 2016-06-26 MED ORDER — CITALOPRAM HYDROBROMIDE 40 MG PO TABS: 40.0000 mg | ORAL_TABLET | Freq: Every day | ORAL | 3 refills | Status: DC

## 2016-06-26 NOTE — Telephone Encounter (Signed)
Please contact the patient left her know that I sent in  citalopram. It is hard to estimate what the price will be but one source I reviewed said it should be much cheaper. All she can do is check with the pharmacist and hope for the best

## 2016-06-27 NOTE — Telephone Encounter (Signed)
Aware. 

## 2016-06-28 ENCOUNTER — Telehealth: Payer: Self-pay | Admitting: Family Medicine

## 2016-06-28 NOTE — Telephone Encounter (Signed)
I completed it and put it in my out box on Oct. 5. Lou Anne transferred to [place] & I signed it on 10/10. It was put in my outbox that day.  Was she able to get the citalopram?

## 2016-06-28 NOTE — Telephone Encounter (Signed)
Letter was faxed again today and confirmation received that [employer] received the fax.

## 2016-07-29 ENCOUNTER — Telehealth: Payer: Self-pay | Admitting: Family Medicine

## 2016-07-29 ENCOUNTER — Other Ambulatory Visit: Payer: Self-pay | Admitting: Family Medicine

## 2016-07-29 NOTE — Telephone Encounter (Signed)
Patient reports she began noticing tenderness in the groin area a couple of days ago but today has noticed that the lymph nodes are swollen.  Patient would like antibiotic called in but I told her she needed to be evaluated.  Appointment made tomorrow, 07/30/16 at 8:45 am.

## 2016-07-29 NOTE — Telephone Encounter (Signed)
Last seen for colitis with stacks 10/17. Please address ([facility] coverage for stacks)

## 2016-07-29 NOTE — Telephone Encounter (Signed)
Covering for PCP  Pt needs to be seen for recurrent infection.   Laroy Apple, MD [facility] 07/29/2016, 2:02 PM

## 2016-07-29 NOTE — Telephone Encounter (Signed)
Patient aware, appointment made for 07/30/16

## 2016-07-30 ENCOUNTER — Encounter: Payer: Self-pay | Admitting: Family

## 2016-07-30 ENCOUNTER — Emergency Department (HOSPITAL_COMMUNITY)
Admission: EM | Admit: 2016-07-30 | Discharge: 2016-07-30 | Disposition: A | Discharge status: Home / Self Care | Payer: Self-pay | Attending: Emergency Medicine | Admitting: Emergency Medicine

## 2016-07-30 ENCOUNTER — Ambulatory Visit (INDEPENDENT_AMBULATORY_CARE_PROVIDER_SITE_OTHER): Payer: Self-pay | Admitting: Family

## 2016-07-30 ENCOUNTER — Encounter (HOSPITAL_COMMUNITY): Payer: Self-pay | Admitting: *Deleted

## 2016-07-30 ENCOUNTER — Emergency Department (HOSPITAL_COMMUNITY): Payer: Self-pay

## 2016-07-30 VITALS — BP 94/71 | HR 103 | Temp 97.5°F | Ht 68.0 in | Wt 205.8 lb

## 2016-07-30 DIAGNOSIS — F17210 Nicotine dependence, cigarettes, uncomplicated: Secondary | ICD-10-CM | POA: Insufficient documentation

## 2016-07-30 DIAGNOSIS — L03317 Cellulitis of buttock: Secondary | ICD-10-CM | POA: Insufficient documentation

## 2016-07-30 DIAGNOSIS — R590 Localized enlarged lymph nodes: Secondary | ICD-10-CM

## 2016-07-30 DIAGNOSIS — R61 Generalized hyperhidrosis: Secondary | ICD-10-CM

## 2016-07-30 DIAGNOSIS — R197 Diarrhea, unspecified: Secondary | ICD-10-CM | POA: Insufficient documentation

## 2016-07-30 DIAGNOSIS — E1140 Type 2 diabetes mellitus with diabetic neuropathy, unspecified: Secondary | ICD-10-CM | POA: Insufficient documentation

## 2016-07-30 DIAGNOSIS — I10 Essential (primary) hypertension: Secondary | ICD-10-CM | POA: Insufficient documentation

## 2016-07-30 DIAGNOSIS — K610 Anal abscess: Secondary | ICD-10-CM

## 2016-07-30 HISTORY — DX: Depression, unspecified: F32.A

## 2016-07-30 HISTORY — DX: Post-traumatic stress disorder, unspecified: F43.10

## 2016-07-30 HISTORY — DX: Major depressive disorder, single episode, unspecified: F32.9

## 2016-07-30 LAB — BASIC METABOLIC PANEL
Anion gap: 10 (ref 5–15)
BUN: 14 mg/dL (ref 6–20)
CO2: 23 mmol/L (ref 22–32)
Calcium: 9.1 mg/dL (ref 8.9–10.3)
Chloride: 102 mmol/L (ref 101–111)
Creatinine, Ser: 0.68 mg/dL (ref 0.44–1.00)
GFR calc Af Amer: >60 mL/min (ref >60)
GFR calc non Af Amer: >60 mL/min (ref >60)
Glucose, Bld: 185 mg/dL — ABNORMAL HIGH (ref 65–99)
Potassium: 3.7 mmol/L (ref 3.5–5.1)
Sodium: 135 mmol/L (ref 135–145)

## 2016-07-30 LAB — CBC WITH DIFFERENTIAL/PLATELET
Basophils Absolute: 0 10*3/uL (ref 0.0–0.1)
Basophils Relative: 0 %
Eosinophils Absolute: 0.1 10*3/uL (ref 0.0–0.7)
Eosinophils Relative: 1 %
HCT: 38.2 % (ref 36.0–46.0)
Hemoglobin: 13.6 g/dL (ref 12.0–15.0)
Lymphocytes Relative: 15 %
Lymphs Abs: 1.9 10*3/uL (ref 0.7–4.0)
MCH: 33.5 pg (ref 26.0–34.0)
MCHC: 35.6 g/dL (ref 30.0–36.0)
MCV: 94.1 fL (ref 78.0–100.0)
Monocytes Absolute: 0.7 10*3/uL (ref 0.1–1.0)
Monocytes Relative: 6 %
Neutro Abs: 10.3 10*3/uL — ABNORMAL HIGH (ref 1.7–7.7)
Neutrophils Relative %: 78 %
Platelets: 267 10*3/uL (ref 150–400)
RBC: 4.06 MIL/uL (ref 3.87–5.11)
RDW: 12.8 % (ref 11.5–15.5)
WBC: 13.1 10*3/uL — ABNORMAL HIGH (ref 4.0–10.5)

## 2016-07-30 MED ORDER — HYDROMORPHONE HCL 2 MG/ML IJ SOLN
1.0000 mg | Freq: Once | INTRAMUSCULAR | Status: AC
  Administered 2016-07-30: 1 mg via INTRAVENOUS
  Filled 2016-07-30: qty 1

## 2016-07-30 MED ORDER — SODIUM CHLORIDE 0.9 % IV BOLUS (SEPSIS)
1000.0000 mL | Freq: Once | INTRAVENOUS | Status: AC
  Administered 2016-07-30: 1000 mL via INTRAVENOUS

## 2016-07-30 MED ORDER — CEPHALEXIN 500 MG PO CAPS: 500.0000 mg | ORAL_CAPSULE | Freq: Four times a day (QID) | ORAL | 0 refills | Status: DC

## 2016-07-30 MED ORDER — IOPAMIDOL (ISOVUE-300) INJECTION 61%
INTRAVENOUS | Status: AC
  Administered 2016-07-30: 100 mL
  Filled 2016-07-30: qty 100

## 2016-07-30 MED ORDER — ONDANSETRON HCL 4 MG/2ML IJ SOLN
4.0000 mg | Freq: Once | INTRAMUSCULAR | Status: AC
  Administered 2016-07-30: 4 mg via INTRAVENOUS
  Filled 2016-07-30: qty 2

## 2016-07-30 MED ORDER — OXYCODONE-ACETAMINOPHEN 5-325 MG PO TABS: 1.0000 | ORAL_TABLET | ORAL | 0 refills | Status: DC | PRN

## 2016-07-30 MED ORDER — CEFTRIAXONE SODIUM 1 G IJ SOLR: 1.0000 g | Freq: Once | INTRAMUSCULAR | Status: DC

## 2016-07-30 MED ORDER — ONDANSETRON HCL 4 MG PO TABS: 4.0000 mg | ORAL_TABLET | Freq: Four times a day (QID) | ORAL | 0 refills | Status: DC

## 2016-07-30 NOTE — Patient Instructions (Signed)
Perianal Abscess An abscess is an infected area that is filled with pus. A perianal abscess occurs in the perineum, which is the area between the anus and the scrotum in males and between the anus and the vagina in females. Perianal abscesses can vary in size. Without treatment, a perianal abscess can become larger and cause other problems. What are the causes? This condition is caused by:  Waste from damaged or dead tissue (debris) that plugs up glands in the perineum. When this happens, an abscess may form.  Infections of the perineum. What are the signs or symptoms? Common symptoms of this condition include:  Swelling and redness in the area of the abscess. The redness may go beyond the abscess and appear as a red streak on the skin.  Pain in the area of the abscess, including pain when sitting, walking, or passing stool. Other possible symptoms include:  A visible, painful lump, or a lump that can be felt when touched.  Bleeding or pus-like discharge from the area.  Fever.  General weakness. How is this diagnosed? This condition is diagnosed based on your medical history and a physical exam of the affected area.  This may involve examining the rectal area with a gloved hand (digital rectal exam).  Sometimes, the health care provider needs to look into the rectum using a probe or a scope.  For women, it may require a careful vaginal exam. How is this treated? Treatment for this condition may include:  Making a cut (incision) in the abscess to drain the pus. This can sometimes be done in your health care provider's office or an emergency department after you are given medicine to numb the area (local anesthetic).  Surgery to drain the abscess. This is for larger or deeper abscesses.  Antibiotic medicines, if there is infection in the surrounding tissue (cellulitis).  Having gauze packed into the abscess to continue draining the area.  Frequent baths in warm water that is  deep enough to cover your hips and buttocks (sitz baths). These help the wound heal and they make the abscess less likely to come back. Follow these instructions at home: Medicines  Take over-the-counter and prescription medicines for pain, fever, or discomfort only as told by your health care provider.  If you were prescribed an antibiotic medicine, use it as told by your health care provider. Do not stop using the antibiotic even if you start to feel better.  Do not drive or use heavy machinery while taking prescription pain medicine. Wound care  Keep the skin around the wound clean and dry. Avoid cleaning the area too much.  Avoid scratching the wound.  Avoid using colored or perfumed toilet papers.  Take a sitz bath 3-4 times a day and after bowel movements. This will help reduce pain and swelling.  If directed, apply ice to the injured area:  Put ice in a plastic bag.  Place a towel between your skin and the bag.  Leave the ice on for 20 minutes, 2-3 times a day.  Check your incision area every day for signs of infection. Check for:  More redness, swelling, or pain.  More fluid or blood.  Warmth.  Pus or a bad smell. Gauze  If gauze was used in the abscess, follow instructions from your health care provider about removing or changing the gauze. It can usually be removed in 2-3 days.  Wash your hands with soap and water before you remove or change your gauze. If soap and  water are not available, use hand sanitizer.  If one or more drains were placed in the abscess cavity, be careful not to pull at them. Your health care provider will tell you how long they need to remain in place. General instructions  Keep all follow-up visits as told by your health care provider. This is important. Contact a health care provider if:  You have trouble passing stool or passing urine.  Your pain or swelling in the affected area does not seem to be getting better.  The gauze  packing or the drains come out before the planned time. Get help right away if:  You have problems moving or using your legs.  You have severe or increasing pain.  Your swelling in the affected area suddenly gets worse.  You have a large increase in bleeding or passing of pus.  You have chills or a fever. This information is not intended to replace advice given to you by your health care provider. Make sure you discuss any questions you have with your health care provider. Document Released: 10/05/2006 Document Revised: 03/18/2016 Document Reviewed: 02/08/2016 Elsevier Interactive Patient Education  2017 [employer].

## 2016-07-30 NOTE — ED Notes (Signed)
Family at bedside. 

## 2016-07-30 NOTE — ED Notes (Signed)
Pt requesting more pain meds, rates pain a #8 on pain scale 0/10.  Dr. Wilson Singer made aware

## 2016-07-30 NOTE — ED Provider Notes (Signed)
[place] DEPT Provider Note   CSN: [id number] Arrival date & time: 07/30/16  1134  By signing my name below, I, Reola Mosher, attest that this documentation has been prepared under the direction and in the presence of Virgel Manifold, MD. Electronically Signed: Reola Mosher, ED Scribe. 07/30/16. 12:23 PM.  History   Chief Complaint Chief Complaint  Patient presents with  . Abscess  . Diarrhea   The history is provided by the patient and medical records. No language interpreter was used.    HPI Comments: Aimee [name] is a 45 y.o. female with a PMHx of DM, who presents to the Emergency Department complaining of moderate, gradually worsening area of pain and swelling to the perianal region onset approximately one week ago. She notes mild drainage from the area as well. Pt was seen by her PCP today prior to her arrival in the ED for this issue, and at that time she was referred in for abscess drainage and to receive IV antibiotics. She was given a 1g injection of Rocephin while in office. Pt was also seen one month ago for diffuse lymphadenopathy, for which she was placed on course of Ciprofloxacin and Flagyl. She finished these medications ~1 week ago and notes that her lymphadenopathy had resolved. However, pt states that approximately three days ago her lymphoadenopathy returned, but was only localized to her inguinal region. Pt states that she has had associated subjective fevers, chills, nausea, loss of appetite, dizziness, light-headedness, lower abdominal pain w/ radiation into the lower back, and intermittent episodes of nocturnal hyperhidrosis secondary to her recurrent lymphadenopathy. Pt additionally notes that she was dx'd with C-Diff ~11 months ago, and has been having persistent diarrhea since. She is a current, everyday smoker. Denies bloody stools, significant weight loss, vaginal bleeding, vaginal discharge, vomiting, urgency, frequency, hematuria, dysuria,  difficulty urinating, or any other associated symptoms.   Past Medical History:  Diagnosis Date  . Depression   . Diabetes mellitus   . Hypertriglyceridemia   . PTSD (post-traumatic stress disorder)    Patient Active Problem List   Diagnosis Date Noted  . Essential hypertension 04/22/2016  . Severe obesity (BMI >= 40) ([place]) 10/08/2015  . PTSD (post-traumatic stress disorder) 09/10/2015  . Trapezius strain 08/19/2015  . Depression 08/19/2015  . Hypertriglyceridemia 11/12/2014  . Peripheral neuropathy ([place]) 11/12/2014  . DDD (degenerative disc disease), lumbar 11/12/2014  . Diabetes ([place]) 03/05/2013  . Smoking 03/05/2013   Past Surgical History:  Procedure Laterality Date  . ABDOMINAL HYSTERECTOMY  2010  . ABDOMINOPLASTY  2006  . [place]   x2  . INNER EAR SURGERY     12 surgeries on R ear starting in 1984; implant 06/2011  . SHOULDER ARTHROSCOPY Left 10/2015  . THORACIC [place] SURGERY  2011, 2012   x2   OB History    No data available     Home Medications    Prior to Admission medications   Medication Sig Start Date End Date Taking? Authorizing Provider  amLODipine (NORVASC) 10 MG tablet Take 1 tablet (10 mg total) by mouth daily. 06/14/16  Yes Claretta Fraise, MD  buprenorphine (BUTRANS) 10 MCG/HR PTWK patch Place 10 mcg onto the skin once a week.  04/29/14  Yes Historical Provider, MD  citalopram (CELEXA) 40 MG tablet Take 1 tablet (40 mg total) by mouth daily. 06/26/16  Yes Claretta Fraise, MD  cyanocobalamin (,VITAMIN B-12,) 1000 MCG/ML injection INJECT 1 ML (1000 MCG TOTAL ) INTO THE MUSCLE EVERY  30 DAYS 05/12/16  Yes Claretta Fraise, MD  diclofenac sodium (VOLTAREN) 1 % GEL Apply 4 g topically 4 (four) times daily. 10/08/15  Yes Claretta Fraise, MD  dicyclomine (BENTYL) 20 MG tablet Take 1 tablet (20 mg total) by mouth 4 (four) times daily -  before meals and at bedtime. 06/14/16  Yes Claretta Fraise, MD  gabapentin (NEURONTIN) 600 MG tablet TAKE 1 BY MOUTH IN  THE MORNING , 2 IN THE AFTERNOON, 3 AT BEDTIME 12/04/15  Yes Claretta Fraise, MD  mometasone (ELOCON) 0.1 % lotion APPLY TOPICALLY DAILY. Patient taking differently: Apply 1 application topically daily as needed (for skin agitation).  10/06/15  Yes Claretta Fraise, MD  omega-3 acid ethyl esters (LOVAZA) 1 g capsule TAKE 2 BY MOUTH TWICE DAILY 12/04/15  Yes Claretta Fraise, MD  Pediatric Multivit-Minerals-C (COMPLETE MULTI-VITAMIN PO) Take 1 tablet by mouth daily.   Yes Historical Provider, MD  PROVENTIL HFA 108 (90 Base) MCG/ACT inhaler Inhale 2 puffs into the lungs every 4 (four) hours as needed for wheezing or shortness of breath.  09/16/15  Yes Historical Provider, MD  tiZANidine (ZANAFLEX) 4 MG tablet Take 4 mg by mouth 3 (three) times daily as needed for muscle spasms. If this makes you too drowsy during the day you can take 1-3 tabs at night 02/11/16  Yes Historical Provider, MD  cephALEXin (KEFLEX) 500 MG capsule Take 1 capsule (500 mg total) by mouth 4 (four) times daily. 07/30/16   Virgel Manifold, MD  ondansetron (ZOFRAN) 4 MG tablet Take 1 tablet (4 mg total) by mouth every 6 (six) hours. 07/30/16   Virgel Manifold, MD  oxyCODONE-acetaminophen (PERCOCET/ROXICET) 5-325 MG tablet Take 1 tablet by mouth every 4 (four) hours as needed for severe pain. 07/30/16   Virgel Manifold, MD   Family History Family History  Problem Relation Age of Onset  . GI problems Mother   . Diabetes Father   . Cancer Maternal Aunt   . Cancer Maternal Grandmother   . Heart attack Maternal Grandfather   . Heart attack Paternal Grandmother   . Heart attack Paternal Grandfather   . Heart attack Paternal Uncle    Social History Social History  Substance Use Topics  . Smoking status: Current Every Day Smoker    Packs/day: 1.00    Years: 23.00    Types: Cigarettes  . Smokeless tobacco: Never Used  . Alcohol use No   Allergies   Hydrocodone; Oxycodone; Tramadol; Mirtazapine; and Darvocet [propoxyphene  n-acetaminophen]  Review of Systems Review of Systems  Constitutional: Positive for appetite change, chills, diaphoresis (nocturnal hyperhidrosis) and fever (subjective). Negative for unexpected weight change.  Gastrointestinal: Positive for abdominal pain, diarrhea and nausea. Negative for blood in stool and vomiting.  Genitourinary: Negative for difficulty urinating, dysuria, frequency, hematuria, urgency, vaginal bleeding and vaginal discharge.  Musculoskeletal: Positive for back pain and myalgias.  Skin: Positive for wound.  Neurological: Positive for light-headedness.  Hematological: Positive for adenopathy.  All other systems reviewed and are negative.  Physical Exam Updated Vital Signs BP 99/78 (BP Location: Right Arm)   Pulse 91   Temp 98.3 F (36.8 C) (Oral)   Resp 18   SpO2 98%   Physical Exam  Constitutional: She appears well-developed and well-nourished.  HENT:  Head: Normocephalic.  Right Ear: External ear normal.  Left Ear: External ear normal.  Nose: Nose normal.  Mouth/Throat: Oropharynx is clear and moist.  Eyes: Conjunctivae are normal. Right eye exhibits no discharge. Left eye exhibits no discharge.  Neck: Normal range of motion.  Cardiovascular: Normal rate, regular rhythm and normal heart sounds.   No murmur heard. Pulmonary/Chest: Effort normal and breath sounds normal. No respiratory distress. She has no wheezes. She has no rales.  Abdominal: Soft. She exhibits no distension. There is no tenderness. There is no rebound and no guarding.  Genitourinary:  Genitourinary Comments: Enlarged tender left inguinal lymph node. Erythema, induration perianally extending for a few cm superiorly. No obvious drainage, no fluctuance. Could not tolerate DRE. Chaperone present throughout entire exam.    Musculoskeletal: Normal range of motion. She exhibits no edema or tenderness.  Neurological: She is alert. No cranial nerve deficit. Coordination normal.  Skin: Skin is  warm and dry. No rash noted. No erythema.  Psychiatric: She has a normal mood and affect. Her behavior is normal.  Nursing note and vitals reviewed.  ED Treatments / Results  DIAGNOSTIC STUDIES: Oxygen Saturation is 99% on RA, normal by my interpretation.   COORDINATION OF CARE: 12:23 PM-Discussed next steps with pt. Pt verbalized understanding and is agreeable with the plan.   Labs (all labs ordered are listed, but only abnormal results are displayed) Labs Reviewed  CBC WITH DIFFERENTIAL/PLATELET - Abnormal; Notable for the following:       Result Value   WBC 13.1 (*)    Neutro Abs 10.3 (*)    All other components within normal limits  BASIC METABOLIC PANEL - Abnormal; Notable for the following:    Glucose, Bld 185 (*)    All other components within normal limits   EKG  EKG Interpretation None      Radiology No results found. Ct Abdomen Pelvis W Contrast  Result Date: 07/30/2016 CLINICAL DATA:  Lower abdominal and pelvic pain. Clinical suspicion for perirectal abscess. EXAM: CT ABDOMEN AND PELVIS WITH CONTRAST TECHNIQUE: Multidetector CT imaging of the abdomen and pelvis was performed using the standard protocol following bolus administration of intravenous contrast. CONTRAST:  131m ISOVUE-300 IOPAMIDOL (ISOVUE-300) INJECTION 61% COMPARISON:  None. FINDINGS: Lower Chest: No acute findings. Hepatobiliary: Mild diffuse hepatic steatosis. No liver masses identified. Gallbladder is unremarkable. Pancreas:  No mass or inflammatory changes. Spleen: Within normal limits in size and appearance. Adrenals/Urinary Tract: No masses identified. No evidence of hydronephrosis. Unremarkable urinary bladder. Stomach/Bowel: No evidence of obstruction. Normal appendix visualized. Mild soft tissue thickening is seen along the gluteal crease and adjacent fat posterior and inferior to the anus, however no abnormal fluid collection identified. Vascular/Lymphatic: No abdominal aortic aneurysm. Aortic  atherosclerosis. Shotty lymph nodes are seen within the left inguinal region measuring up to 10 mm, which is not considered pathologically enlarged in this region. There is mild stranding in the adjacent subcutaneous fat. No evidence of soft tissue gas or abscess. Reproductive: Prior hysterectomy noted. Adnexal regions are unremarkable in appearance. Other:  None. Musculoskeletal:  No suspicious bone lesions identified. IMPRESSION: Mild soft tissue thickening along gluteal crease with mild soft tissue stranding in adjacent fat. No perianal abscess identified. If clinically warranted, consider nonemergent pelvic MRI without and with contrast to evaluate for subtle perianal fistula due to higher sensitivity of MRI compared to CT. Shotty left inguinal lymph nodes and soft tissue stranding in adjacent fat, suspicious for cellulitis in this region. No evidence of abscess. Incidentally noted mild hepatic steatosis and aortic atherosclerosis. Electronically Signed   By: JEarle GellM.D.   On: 07/30/2016 15:45    Procedures Procedures   Medications Ordered in ED Medications  sodium chloride 0.9 % bolus 1,000 mL (  0 mLs Intravenous Stopped 07/30/16 1503)  HYDROmorphone (DILAUDID) injection 1 mg (1 mg Intravenous Given 07/30/16 1319)  ondansetron (ZOFRAN) injection 4 mg (4 mg Intravenous Given 07/30/16 1320)  iopamidol (ISOVUE-300) 61 % injection (100 mLs  Contrast Given 07/30/16 1514)  HYDROmorphone (DILAUDID) injection 1 mg (1 mg Intravenous Given 07/30/16 1542)    Initial Impression / Assessment and Plan / ED Course  I have reviewed the triage vital signs and the nursing notes.  Pertinent labs & imaging results that were available during my care of the patient were reviewed by me and considered in my medical decision making (see chart for details).  Clinical Course    45 year old female with buttock pain. Exam is consistent with cellulitis. No general collection on physical exam for any noted on  imaging. Her antibodies will be changed to better cover for cellulitis. Suspect that her a little adenopathy may be secondary to this process? No significant perineal involvement though.  Final Clinical Impressions(s) / ED Diagnoses   Final diagnoses:  Cellulitis of buttock  Inguinal adenopathy   New Prescriptions Discharge Medication List as of 07/30/2016  4:56 PM    START taking these medications   Details  cephALEXin (KEFLEX) 500 MG capsule Take 1 capsule (500 mg total) by mouth 4 (four) times daily., Starting Sat 07/30/2016, Print    ondansetron (ZOFRAN) 4 MG tablet Take 1 tablet (4 mg total) by mouth every 6 (six) hours., Starting Sat 07/30/2016, Print    oxyCODONE-acetaminophen (PERCOCET/ROXICET) 5-325 MG tablet Take 1 tablet by mouth every 4 (four) hours as needed for severe pain., Starting Sat 07/30/2016, Print       I personally preformed the services scribed in my presence. The recorded information has been reviewed is accurate. Virgel Manifold, MD.     Virgel Manifold, MD 08/10/16 1430

## 2016-07-30 NOTE — ED Notes (Signed)
Patient transported to CT 

## 2016-07-30 NOTE — ED Triage Notes (Signed)
Pt reports being diagnosed with cdiff recently and still has diarrhea. Also has perianal abscess, went to pcp today and was sent here for possible admission due to swelling of her lymph nodes, fever/chills and low bp.

## 2016-07-30 NOTE — Progress Notes (Signed)
   Subjective:    Patient ID: Aimee [name], female    DOB: July 14, 1971, 45 y.o.   MRN: [id number]  HPI PT presents to the office today with swollen lymph nodes in left groin and around her anus. PT states this started last month and was seen in the office and was given Flagyl and Cipro. PT reports after taking the antibiotics the lymph nodes "went away". PT completed the antibiotics last week and her lymph nodes started swelling about 3 days. PT reports have night sweats, dizziness, fatigue, decrease appetite,  and diarrhea.   Review of Systems  Constitutional: Positive for fatigue.  Gastrointestinal: Positive for diarrhea.  Neurological: Positive for dizziness.  All other systems reviewed and are negative.      Objective:   Physical Exam  Constitutional: She is oriented to person, place, and time. She appears well-developed and well-nourished. No distress.  HENT:  Head: Normocephalic.  Eyes: Pupils are equal, round, and reactive to light.  Neck: Normal range of motion. Neck supple. No thyromegaly present.  Cardiovascular: Normal rate, regular rhythm, normal heart sounds and intact distal pulses.   No murmur heard. Pulmonary/Chest: Effort normal and breath sounds normal. No respiratory distress. She has no wheezes.  Abdominal: Soft. Bowel sounds are normal. She exhibits no distension. There is no tenderness.  Genitourinary:     Genitourinary Comments: Erythemas and tenderness around anus, with abscess present with yellow drainage   Musculoskeletal: Normal range of motion. She exhibits no edema or tenderness.  Neurological: She is alert and oriented to person, place, and time.  Skin: Skin is warm and dry.  Swollen left inguinal lymph node, tender to touch    Psychiatric: She has a normal mood and affect. Her behavior is normal. Judgment and thought content normal.  Vitals reviewed.     BP 94/71   Pulse (!) 103   Temp 97.5 F (36.4 C) (Oral)   Ht 5' 8"  (1.727 m)   Wt  205 lb 12.8 oz (93.4 kg)   BMI 31.29 kg/m      Assessment & Plan:  1. Inguinal lymphadenopathy - cefTRIAXone (ROCEPHIN) injection 1 g; Inject 1 g into the muscle once.  2. Night sweat - cefTRIAXone (ROCEPHIN) injection 1 g; Inject 1 g into the muscle once.  3. Perianal abscess  Cancel Rocephin, CBC, and CMP and will send to ED. PT has perianal abscess and will need antibiotics and needs to be drained.  PT is going home to get husband and will go to ED RTO prn   Evelina Dun, FNP

## 2016-08-01 ENCOUNTER — Other Ambulatory Visit: Payer: Self-pay | Admitting: Family

## 2016-10-02 ENCOUNTER — Emergency Department (HOSPITAL_COMMUNITY): Payer: No Typology Code available for payment source

## 2016-10-02 ENCOUNTER — Encounter (HOSPITAL_COMMUNITY): Payer: Self-pay | Admitting: Emergency Medicine

## 2016-10-02 ENCOUNTER — Emergency Department (HOSPITAL_COMMUNITY)
Admission: EM | Admit: 2016-10-02 | Discharge: 2016-10-03 | Disposition: A | Discharge status: Home / Self Care | Payer: No Typology Code available for payment source | Attending: Emergency Medicine | Admitting: Emergency Medicine

## 2016-10-02 DIAGNOSIS — Y92410 Unspecified street and highway as the place of occurrence of the external cause: Secondary | ICD-10-CM | POA: Insufficient documentation

## 2016-10-02 DIAGNOSIS — Y999 Unspecified external cause status: Secondary | ICD-10-CM | POA: Insufficient documentation

## 2016-10-02 DIAGNOSIS — S29011A Strain of muscle and tendon of front wall of thorax, initial encounter: Secondary | ICD-10-CM | POA: Insufficient documentation

## 2016-10-02 DIAGNOSIS — S161XXA Strain of muscle, fascia and tendon at neck level, initial encounter: Secondary | ICD-10-CM | POA: Diagnosis not present

## 2016-10-02 DIAGNOSIS — Z79899 Other long term (current) drug therapy: Secondary | ICD-10-CM | POA: Insufficient documentation

## 2016-10-02 DIAGNOSIS — F17210 Nicotine dependence, cigarettes, uncomplicated: Secondary | ICD-10-CM | POA: Insufficient documentation

## 2016-10-02 DIAGNOSIS — E1140 Type 2 diabetes mellitus with diabetic neuropathy, unspecified: Secondary | ICD-10-CM | POA: Insufficient documentation

## 2016-10-02 DIAGNOSIS — S29019A Strain of muscle and tendon of unspecified wall of thorax, initial encounter: Secondary | ICD-10-CM

## 2016-10-02 DIAGNOSIS — Y939 Activity, unspecified: Secondary | ICD-10-CM | POA: Insufficient documentation

## 2016-10-02 DIAGNOSIS — I10 Essential (primary) hypertension: Secondary | ICD-10-CM | POA: Insufficient documentation

## 2016-10-02 DIAGNOSIS — S199XXA Unspecified injury of neck, initial encounter: Secondary | ICD-10-CM | POA: Diagnosis present

## 2016-10-02 MED ORDER — KETOROLAC TROMETHAMINE 30 MG/ML IJ SOLN
60.0000 mg | Freq: Once | INTRAMUSCULAR | Status: AC
  Administered 2016-10-02: 60 mg via INTRAMUSCULAR
  Filled 2016-10-02: qty 2

## 2016-10-02 NOTE — ED Notes (Signed)
Pt still at MR. Not in room

## 2016-10-02 NOTE — ED Notes (Signed)
Pt not in room.
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
## 2016-10-03 MED ORDER — LIDOCAINE 5 % EX PTCH: 1.0000 | MEDICATED_PATCH | CUTANEOUS | 0 refills | Status: DC

## 2016-10-03 NOTE — ED Notes (Signed)
PA at bedside.

## 2016-10-10 ENCOUNTER — Encounter: Payer: Self-pay | Admitting: Family Medicine

## 2016-10-19 ENCOUNTER — Telehealth: Payer: Self-pay | Admitting: Family Medicine

## 2016-10-19 NOTE — Telephone Encounter (Signed)
Patient wanted to know her med history. Revived medication history with patient.

## 2016-10-24 ENCOUNTER — Encounter: Payer: Self-pay | Admitting: Family Medicine

## 2016-10-24 ENCOUNTER — Ambulatory Visit (INDEPENDENT_AMBULATORY_CARE_PROVIDER_SITE_OTHER): Payer: BLUE CROSS/BLUE SHIELD | Admitting: Family Medicine

## 2016-10-24 VITALS — BP 108/75 | HR 88 | Temp 99.0°F | Ht 68.0 in | Wt 204.0 lb

## 2016-10-24 DIAGNOSIS — I10 Essential (primary) hypertension: Secondary | ICD-10-CM

## 2016-10-24 DIAGNOSIS — A4902 Methicillin resistant Staphylococcus aureus infection, unspecified site: Secondary | ICD-10-CM

## 2016-10-24 DIAGNOSIS — F331 Major depressive disorder, recurrent, moderate: Secondary | ICD-10-CM | POA: Diagnosis not present

## 2016-10-24 DIAGNOSIS — G6289 Other specified polyneuropathies: Secondary | ICD-10-CM

## 2016-10-24 MED ORDER — DOXYCYCLINE HYCLATE 100 MG PO TABS: 100.0000 mg | ORAL_TABLET | Freq: Two times a day (BID) | ORAL | 0 refills | Status: DC
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
## 2016-10-27 ENCOUNTER — Other Ambulatory Visit: Payer: Self-pay | Admitting: Family Medicine

## 2016-10-28 DIAGNOSIS — M5137 Other intervertebral disc degeneration, lumbosacral region: Secondary | ICD-10-CM | POA: Diagnosis not present

## 2016-10-28 DIAGNOSIS — G5623 Lesion of ulnar nerve, bilateral upper limbs: Secondary | ICD-10-CM | POA: Diagnosis not present

## 2016-10-28 DIAGNOSIS — M542 Cervicalgia: Secondary | ICD-10-CM | POA: Diagnosis not present

## 2016-10-28 DIAGNOSIS — M5126 Other intervertebral disc displacement, lumbar region: Secondary | ICD-10-CM | POA: Diagnosis not present

## 2016-10-29 ENCOUNTER — Encounter: Payer: Self-pay | Admitting: Family Medicine

## 2016-10-29 ENCOUNTER — Other Ambulatory Visit: Payer: Self-pay | Admitting: Adult Health

## 2016-10-31 ENCOUNTER — Other Ambulatory Visit: Payer: Self-pay | Admitting: Family Medicine

## 2016-11-02 ENCOUNTER — Other Ambulatory Visit: Payer: Self-pay | Admitting: Family Medicine

## 2016-11-02 ENCOUNTER — Telehealth: Payer: Self-pay | Admitting: Family Medicine

## 2016-11-02 ENCOUNTER — Other Ambulatory Visit: Payer: Self-pay | Admitting: *Deleted

## 2016-11-02 MED ORDER — CITALOPRAM HYDROBROMIDE 40 MG PO TABS: 40.0000 mg | ORAL_TABLET | Freq: Every day | ORAL | 0 refills | Status: DC

## 2016-11-02 MED ORDER — CITALOPRAM HYDROBROMIDE 40 MG PO TABS: 60.0000 mg | ORAL_TABLET | Freq: Every day | ORAL | 1 refills | Status: DC

## 2016-11-02 NOTE — Telephone Encounter (Signed)
Please contact the patient Yes. Please take 1.5 tabs daily. Wil send inn updated scrip

## 2016-11-02 NOTE — Telephone Encounter (Signed)
Patient aware of instruction for taking Celexa and that new prescription has been sent to her pharmacy

## 2016-11-02 NOTE — Telephone Encounter (Signed)
What is the name of the medication? Antidepressant was supposed to be increased to 67m  Have you contacted your pharmacy to request a refill? no  Which pharmacy would you like this sent to? [facility] [place]   Patient notified that their request is being sent to the clinical staff for review and that they should receive a call once it is complete. If they do not receive a call within 24 hours they can check with their pharmacy or our office.

## 2016-11-02 NOTE — Telephone Encounter (Signed)
Patient thought we were increasing her Celexa to 60 mg, please advise

## 2016-11-09 ENCOUNTER — Encounter: Payer: Self-pay | Admitting: Family Medicine

## 2016-11-09 ENCOUNTER — Other Ambulatory Visit: Payer: Self-pay | Admitting: Neurosurgery

## 2016-11-09 DIAGNOSIS — M5126 Other intervertebral disc displacement, lumbar region: Secondary | ICD-10-CM

## 2016-11-10 ENCOUNTER — Other Ambulatory Visit: Payer: Self-pay | Admitting: Family Medicine

## 2016-11-10 DIAGNOSIS — H90A31 Mixed conductive and sensorineural hearing loss, unilateral, right ear with restricted hearing on the contralateral side: Secondary | ICD-10-CM | POA: Diagnosis not present

## 2016-11-10 DIAGNOSIS — H906 Mixed conductive and sensorineural hearing loss, bilateral: Secondary | ICD-10-CM | POA: Diagnosis not present

## 2016-11-10 DIAGNOSIS — Z1231 Encounter for screening mammogram for malignant neoplasm of breast: Secondary | ICD-10-CM

## 2016-11-10 DIAGNOSIS — H7011 Chronic mastoiditis, right ear: Secondary | ICD-10-CM | POA: Diagnosis not present

## 2016-11-11 ENCOUNTER — Ambulatory Visit: Payer: BLUE CROSS/BLUE SHIELD | Attending: Neurosurgery | Admitting: Physical Therapy

## 2016-11-11 ENCOUNTER — Encounter: Payer: Self-pay | Admitting: Physical Therapy

## 2016-11-11 DIAGNOSIS — M542 Cervicalgia: Secondary | ICD-10-CM | POA: Insufficient documentation
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
## 2016-11-11 NOTE — Therapy (Signed)
[facility] [place], [place], [address] Phone: [phone]   Fax:  [phone]  Physical Therapy Evaluation  Patient Details  Name: Aimee [name] MRN: [id number] Date of Birth: 01/08/1971 Referring Provider: Kary Kos, MD  Encounter Date: 11/11/2016      PT End of Session - 11/11/16 0823    Visit Number 1   Number of Visits 12   Date for PT Re-Evaluation 12/23/16   PT Start Time 0825   PT Stop Time 0918   PT Time Calculation (min) 53 min   Activity Tolerance Patient tolerated treatment well   Behavior During Therapy [facility] for tasks assessed/performed      Past Medical History:  Diagnosis Date  . Depression   . Diabetes mellitus   . Hypertriglyceridemia   . PTSD (post-traumatic stress disorder)     Past Surgical History:  Procedure Laterality Date  . ABDOMINAL HYSTERECTOMY  2010  . ABDOMINOPLASTY  2006  . [place]   x2  . INNER EAR SURGERY     12 surgeries on R ear starting in 1984; implant 06/2011  . SHOULDER ARTHROSCOPY Left 10/2015  . [place] SURGERY  2011, 2012   x2    There were no vitals filed for this visit.       Subjective Assessment - 11/11/16 0824    Subjective Patient has been having pain in her neck x 10 years. She started having increased pain in BUT to base of skull 3 months ago. Left worse than right. She has neuropathy in B feet and hands. 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    Pertinent History Neck surgeries 2016 and 2014 fusions, L shoulder surgery 2017, DDD, HTN, DM, neuropathy, hearing loss, PTSD, depression   Diagnostic tests xrays   Patient Stated Goals to get back to feeling good   Currently in Pain? Yes   Pain Score 6    Pain Location Neck   Pain Orientation Right;Left   Pain Descriptors / Indicators Tightness  deep   Pain Type Chronic pain   Pain Radiating Towards into B UT   Pain Onset More than a month ago   Pain Frequency  Constant   Aggravating Factors  holding in one position too long and being up   Pain Relieving Factors lying down, heat and ice   Effect of Pain on Daily Activities disrupting to ADLS, unable to be as active with grandkids            [facility] PT Assessment - 11/11/16 0001      Assessment   Medical Diagnosis cervicalgia   Referring Provider Kary Kos, MD   Onset Date/Surgical Date 09/12/06   Hand Dominance Right   Next MD Visit NCV test 2 weeks     Precautions   Precautions None     Restrictions   Weight Bearing Restrictions No     Balance Screen   Has the patient fallen in the past 6 months Yes   How many times? 3   Has the patient had a decrease in activity level because of a fear of falling?  Yes   Is the patient reluctant to leave their home because of a fear of falling?  No     Prior Function   Level of Independence Independent   Vocation Full time employment     Posture/Postural Control   Posture/Postural Control Postural limitations   Postural Limitations Rounded Shoulders;Forward head  Tight R QL, tight L pecs     ROM / Strength   AROM / PROM / Strength AROM     AROM   Overall AROM Comments B cerv SB 26 deg; R rot 70  L rot 65, flex chin 2 inch from chest, ext WNL; B shoulders WFL     Palpation   Palpation comment B UT, Lev Scap, L pecs, cervical paraspinals and subocipitals; mulitiple TPs and pain                   [facility] Adult PT Treatment/Exercise - 11/11/16 0001      Modalities   Modalities Electrical Stimulation;Moist Heat     Moist Heat Therapy   Number Minutes Moist Heat 15 Minutes   Moist Heat Location Cervical     Electrical Stimulation   Electrical Stimulation Location B UT and neck    Electrical Stimulation Action IFC   Electrical Stimulation Parameters 80-150 Hz x 15 min to tolerance   Electrical Stimulation Goals Pain     Manual Therapy   Manual Therapy Soft tissue mobilization   Soft tissue mobilization to B UT and levator  scapulae          Trigger Point Dry Needling - 11/11/16 0916    Consent Given? Yes   Education Handout Provided Yes   Muscles Treated Upper Body Upper trapezius;Levator scapulae  B   Upper Trapezius Response Twitch reponse elicited;Palpable increased muscle length   Levator Scapulae Response Twitch response elicited;Palpable increased muscle length              PT Education - 11/11/16 1000    Education provided Yes   Education Details Dry needling education and aftercare   Person(s) Educated Patient   Methods Explanation;Demonstration;Handout   Comprehension Verbalized understanding             PT Long Term Goals - 11/11/16 0916      PT LONG TERM GOAL #1   Title Patient to be I with HEP   Time 6   Period Weeks   Status New     PT LONG TERM GOAL #2   Title Patient to report decreased pain with ADLS to 1/61 or less to improve function.   Time 6   Period Weeks   Status New     PT LONG TERM GOAL #3   Title Patient able to sleep 6 hours without waking from neck pain.   Time 6   Period Weeks   Status New               Plan - 11/11/16 1000    Clinical Impression Statement Patient presents with a long history of chronic neck and back pain and multiple surgeries. She works as an Optometrist and must take multiple breaks when working with clients secondary to pain. She has decreased cervical ROM and reports difficulty holding her head up at times because it feels so heavy. She also has neuropathy in her hands and feet and has had multiple falls due to this.  Previous L shoulder surgery may be contributing to her pain as well. She has tight pecs on the left and the shoulder is slightly elevated. She responded well to TP dry needling today with +++ twitch responses elicited in the R UT. Normal response to estim and heat. Due to deficits and mulltiple comorbidiites this eval is moderate complexity.   Rehab Potential Good   PT Frequency 2x / week   PT Duration 6  weeks   PT Treatment/Interventions ADLs/Self Care Home Management;Electrical Stimulation;Moist Heat;Ultrasound;Therapeutic activities;Therapeutic exercise;Neuromuscular re-education;Manual techniques;Patient/family education;Dry needling;Taping   PT Next Visit Plan Assess DN and continue as indicated (suboccipitals, cspine, L pecs); Manual/STW;  cervical stretches and stabilization.   Consulted and Agree with Plan of Care Patient      Patient will benefit from skilled therapeutic intervention in order to improve the following deficits and impairments:  Postural dysfunction, Impaired flexibility, Decreased range of motion, Pain  Visit Diagnosis: Cervicalgia - Plan: PT plan of care cert/re-cert     Problem List Patient Active Problem List   Diagnosis Date Noted  . Essential hypertension 04/22/2016  . Severe obesity (BMI >= 40) ([place]) 10/08/2015  . PTSD (post-traumatic stress disorder) 09/10/2015  . Trapezius strain 08/19/2015  . Depression 08/19/2015  . Hypertriglyceridemia 11/12/2014  . Peripheral neuropathy ([place]) 11/12/2014  . DDD (degenerative disc disease), lumbar 11/12/2014  . Diabetes ([place]) 03/05/2013  . Smoking 03/05/2013    Madelyn Flavors PT 11/11/2016, 10:18 AM  [facility] [address] [place], [place], [address] Phone: [phone]   Fax:  [phone]  Name: Aimee [name] MRN: [id number] Date of Birth: 16-Jan-1971

## 2016-11-14 ENCOUNTER — Ambulatory Visit: Payer: BLUE CROSS/BLUE SHIELD | Admitting: Family Medicine

## 2016-11-15 ENCOUNTER — Other Ambulatory Visit: Payer: Self-pay | Admitting: Family

## 2016-11-15 ENCOUNTER — Ambulatory Visit
Admission: RE | Admit: 2016-11-15 | Discharge: 2016-11-15 | Disposition: A | Discharge status: Home / Self Care | Payer: BLUE CROSS/BLUE SHIELD | Source: Ambulatory Visit | Attending: Family Medicine | Admitting: Family Medicine

## 2016-11-15 DIAGNOSIS — Z1231 Encounter for screening mammogram for malignant neoplasm of breast: Secondary | ICD-10-CM

## 2016-11-20 ENCOUNTER — Other Ambulatory Visit: Payer: Self-pay | Admitting: Adult Health

## 2016-11-21 ENCOUNTER — Other Ambulatory Visit: Payer: Self-pay

## 2016-11-21 ENCOUNTER — Encounter: Payer: Self-pay | Admitting: Family Medicine

## 2016-11-21 ENCOUNTER — Ambulatory Visit
Admission: RE | Admit: 2016-11-21 | Discharge: 2016-11-21 | Disposition: A | Discharge status: Home / Self Care | Payer: Self-pay | Source: Ambulatory Visit | Attending: Neurosurgery | Admitting: Neurosurgery

## 2016-11-21 ENCOUNTER — Ambulatory Visit (INDEPENDENT_AMBULATORY_CARE_PROVIDER_SITE_OTHER): Payer: BLUE CROSS/BLUE SHIELD | Admitting: Family Medicine

## 2016-11-21 VITALS — BP 117/78 | HR 78 | Temp 97.9°F | Ht 68.0 in | Wt 217.0 lb

## 2016-11-21 DIAGNOSIS — R5383 Other fatigue: Secondary | ICD-10-CM | POA: Diagnosis not present

## 2016-11-21 DIAGNOSIS — M5416 Radiculopathy, lumbar region: Secondary | ICD-10-CM

## 2016-11-21 DIAGNOSIS — G6289 Other specified polyneuropathies: Secondary | ICD-10-CM | POA: Diagnosis not present

## 2016-11-21 DIAGNOSIS — F331 Major depressive disorder, recurrent, moderate: Secondary | ICD-10-CM | POA: Diagnosis not present

## 2016-11-21 DIAGNOSIS — E1142 Type 2 diabetes mellitus with diabetic polyneuropathy: Secondary | ICD-10-CM

## 2016-11-21 DIAGNOSIS — I10 Essential (primary) hypertension: Secondary | ICD-10-CM

## 2016-11-21 LAB — BAYER DCA HB A1C WAIVED: HB A1C (BAYER DCA - WAIVED): 6.5 % (ref <7.0)

## 2016-11-21 MED ORDER — BLOOD GLUCOSE MONITOR KIT: PACK | 0 refills | Status: DC

## 2016-11-21 MED ORDER — DULOXETINE HCL 60 MG PO CPEP: 120.0000 mg | ORAL_CAPSULE | Freq: Every day | ORAL | 3 refills | Status: DC

## 2016-11-21 NOTE — Progress Notes (Signed)
Subjective:  Patient ID: Aimee [name], female    DOB: November 24, 1970  Age: 46 y.o. MRN: [id number]  CC: Hypertension (pt here today for routine follow up and labs)   HPI Aimee [name] presents for  follow-up of hypertension. Patient has no history of headache chest pain or shortness of breath or recent cough. Patient also denies symptoms of TIA such as numbness weakness lateralizing. Patient checks  blood pressure at home. Recent readings have been good Patient denies side effects from medication. States taking it regularly.  Patient also  in for follow-up of elevated cholesterol. Doing well without complaints on current medication. Denies side effects of statin including myalgia and arthralgia and nausea. Also in today for liver function testing. Currently no chest pain, shortness of breath or other cardiovascular related symptoms noted.  Follow-up of diabetes. Patient does not check blood sugar at home. Could not afford strips.  Patient denies symptoms such as polyuria, polydipsia, excessive hunger, nausea No significant hypoglycemic spells noted. Medications reviewed. Pt reports taking them regularly. Pt. denies complication/adverse reaction today.    MRI scheduled for tonight of back. EMG NCV tomorrow for neuropathy. Ortho to see her in three days for loss of hand strength & coordination.  Depression screen [facility] 2/9 11/21/2016 10/24/2016 07/30/2016 04/22/2016 02/15/2016  Decreased Interest _0 Down, Depressed, Hopeless _1 PHQ - 2 Score _2 Altered sleeping _3 Tired, decreased energy _4 Change in appetite _5 Feeling bad or failure about yourself  _6 Trouble concentrating _7 Moving slowly or fidgety/restless _8 Suicidal thoughts 0 0 (No Data) 2 0  PHQ-9 Score _9 Difficult doing work/chores Very difficult Very difficult - Somewhat difficult -  Having a lot of rage. Better now.   Any little thing triggers  sadness, whiny & crying. "That's not me, I'm the strong one."  History Aimee [name] has a past medical history of Depression; Diabetes mellitus; Hypertriglyceridemia; and PTSD (post-traumatic stress disorder).   She has a past surgical history that includes Abdominoplasty (2006); Inner ear surgery; Thoracic disc surgery (2011, 2012); Abdominal hysterectomy (2010); Cesarean section (1991, 1995); Shoulder arthroscopy (Left, 10/2015); and Breast excisional biopsy.   Her family history includes Breast cancer in her maternal aunt; Cancer in her maternal aunt and maternal grandmother; Diabetes in her father; GI problems in her mother; Heart attack in her maternal grandfather, paternal grandfather, paternal grandmother, and paternal uncle.She reports that she has been smoking Cigarettes.  She has a 23.00 pack-year smoking history. She has never used smokeless tobacco. She reports that she does not drink alcohol or use drugs.  Current Outpatient Prescriptions on File Prior to Visit  Medication Sig Dispense Refill  . amLODipine (NORVASC) 10 MG tablet Take 1 tablet (10 mg total) by mouth daily. (Patient taking differently: Take 10 mg by mouth at bedtime. ) 90 tablet 3  . cyanocobalamin (,VITAMIN B-12,) 1000 MCG/ML injection INJECT 1 ML (1000 MCG TOTAL ) INTO THE MUSCLE EVERY 30 DAYS 10 mL 0  . diclofenac sodium (VOLTAREN) 1 % GEL Apply 4 g topically 4 (four) times daily. (Patient taking differently: Apply 4 g topically 4 (four) times daily as needed (neck and back pain). ) 100 Tube 0  . dicyclomine (BENTYL) 20 MG tablet Take 1  tablet (20 mg total) by mouth 4 (four) times daily -  before meals and at bedtime. (Patient taking differently: Take 20 mg by mouth 4 (four) times daily as needed (stomach cramps). ) 60 tablet 4  . doxycycline (VIBRA-TABS) 100 MG tablet Take 1 tablet (100 mg total) by mouth 2 (two) times daily. 84 tablet 0  . fluconazole (DIFLUCAN) 150 MG tablet TAKE 1 TABLET NOW AND AGAIN IN THREE DAYS 2  tablet 0  . gabapentin (NEURONTIN) 600 MG tablet USE ONE IN THE MORNING, 2 IN AFTERNOON, 3 AT BEDTIME 180 tablet 0  . lidocaine (LIDODERM) 5 % Place 1 patch onto the skin daily. Remove & Discard patch within 12 hours or as directed by MD 5 patch 0  . mometasone (ELOCON) 0.1 % lotion APPLY TOPICALLY DAILY. (Patient taking differently: Apply 1 application topically daily as needed (for skin irritation (ears)). ) 180 mL 3  . NASONEX 50 MCG/ACT nasal spray PLACE 2 SPRAYS INTO THE NOSE DAILY. 17 g 2  . omega-3 acid ethyl esters (LOVAZA) 1 g capsule TAKE 2 CAPSULE 2 TIMES A DAY 120 capsule 0  . Omega-3 Fatty Acids (FISH OIL) 1000 MG CAPS Take 3,000 mg by mouth at bedtime.    . TURMERIC PO Take 525 mg by mouth at bedtime.     No current facility-administered medications on file prior to visit.     ROS Review of Systems  Constitutional: Positive for fatigue. Negative for activity change, appetite change and fever.  HENT: Positive for hearing loss. Negative for congestion, rhinorrhea and sore throat.   Eyes: Negative for visual disturbance.  Respiratory: Negative for cough and shortness of breath.   Cardiovascular: Negative for chest pain and palpitations.  Gastrointestinal: Negative for abdominal pain, diarrhea and nausea.  Genitourinary: Negative for dysuria.  Musculoskeletal: Positive for arthralgias, back pain and myalgias.    Objective:  BP 117/78   Pulse 78   Temp 97.9 F (36.6 C) (Oral)   Ht 5' 8" (1.727 m)   Wt 217 lb (98.4 kg)   BMI 32.99 kg/m   BP Readings from Last 3 Encounters:  11/21/16 117/78  10/24/16 108/75  10/03/16 123/91    Wt Readings from Last 3 Encounters:  11/21/16 217 lb (98.4 kg)  10/24/16 204 lb (92.5 kg)  07/30/16 205 lb 12.8 oz (93.4 kg)     Physical Exam  Constitutional: She is oriented to person, place, and time. She appears well-developed and well-nourished. No distress.  HENT:  Head: Normocephalic and atraumatic.  Right Ear: External ear  normal.  Left Ear: External ear normal.  Nose: Nose normal.  Mouth/Throat: Oropharynx is clear and moist.  Eyes: Conjunctivae and EOM are normal. Pupils are equal, round, and reactive to light.  Neck: Normal range of motion. Neck supple. No thyromegaly present.  Cardiovascular: Normal rate, regular rhythm and normal heart sounds.   No murmur heard. Pulmonary/Chest: Effort normal and breath sounds normal. No respiratory distress. She has no wheezes. She has no rales.  Abdominal: Soft. Bowel sounds are normal. She exhibits no distension. There is no tenderness.  Musculoskeletal: She exhibits tenderness (lumbar, hips shoulders).  Lymphadenopathy:    She has no cervical adenopathy.  Neurological: She is alert and oriented to person, place, and time. She has normal reflexes.  Skin: Skin is warm and dry.  Psychiatric: She has a normal mood and affect. Her behavior is normal. Judgment and thought content normal.    No components found for: BAYER DCA HB A1C  WAIVED    Assessment & Plan:   Ronasia was seen today for hypertension.  Diagnoses and all orders for this visit:  Type 2 diabetes mellitus with diabetic polyneuropathy, without long-term current use of insulin (HCC) -     Arthritis Panel -     Anti-DNA antibody, double-stranded -     Sedimentation rate -     Microalbumin / creatinine urine ratio -     Urinalysis -     Bayer DCA Hb A1c Waived -     Lipid panel -     Ambulatory referral to Ophthalmology -     CMP14+EGFR -     C-peptide -     TSH + free T4  Other polyneuropathy (HCC)  Moderate episode of recurrent major depressive disorder (HCC)  Severe obesity (BMI >= 40) (HCC)  Lumbar radiculopathy -     Arthritis Panel -     Anti-DNA antibody, double-stranded -     Sedimentation rate  Other fatigue -     Arthritis Panel -     TSH + free T4  Essential hypertension -     Urinalysis -     CMP14+EGFR  Other orders -     blood glucose meter kit and supplies KIT;  Dispense based on patient and insurance preference. Use up to four times daily as directed. (FOR ICD-9 250.00, 250.01). -     DULoxetine (CYMBALTA) 60 MG capsule; Take 2 capsules (120 mg total) by mouth daily.   I have discontinued Ms. Berkery's citalopram. I am also having her start on blood glucose meter kit and supplies and DULoxetine. Additionally, I am having her maintain her mometasone, diclofenac sodium, cyanocobalamin, dicyclomine, amLODipine, TURMERIC PO, Fish Oil, lidocaine, doxycycline, omega-3 acid ethyl esters, gabapentin, NASONEX, fluconazole, and predniSONE.  Meds ordered this encounter  Medications  . predniSONE (DELTASONE) 20 MG tablet    Sig: Take 20 mg by mouth as directed.    Refill:  0  . blood glucose meter kit and supplies KIT    Sig: Dispense based on patient and insurance preference. Use up to four times daily as directed. (FOR ICD-9 250.00, 250.01).    Dispense:  1 each    Refill:  0    Order Specific Question:   Number of strips    Answer:   100    Order Specific Question:   Number of lancets    Answer:   100  . DULoxetine (CYMBALTA) 60 MG capsule    Sig: Take 2 capsules (120 mg total) by mouth daily.    Dispense:  180 capsule    Refill:  3     Follow-up: Return in about 3 months (around 02/21/2017), or if symptoms worsen or fail to improve.  Claretta Fraise, M.D.

## 2016-11-22 ENCOUNTER — Encounter: Payer: Self-pay | Admitting: Family Medicine

## 2016-11-22 ENCOUNTER — Other Ambulatory Visit: Payer: Self-pay | Admitting: Family Medicine

## 2016-11-22 DIAGNOSIS — L732 Hidradenitis suppurativa: Secondary | ICD-10-CM | POA: Diagnosis not present

## 2016-11-22 DIAGNOSIS — L821 Other seborrheic keratosis: Secondary | ICD-10-CM | POA: Diagnosis not present

## 2016-11-22 DIAGNOSIS — L570 Actinic keratosis: Secondary | ICD-10-CM | POA: Diagnosis not present

## 2016-11-22 DIAGNOSIS — D229 Melanocytic nevi, unspecified: Secondary | ICD-10-CM | POA: Diagnosis not present

## 2016-11-22 LAB — ARTHRITIS PANEL
BASOS: 0 %
Basophils Absolute: 0 10*3/uL (ref 0.0–0.2)
EOS (ABSOLUTE): 0.1 10*3/uL (ref 0.0–0.4)
EOS: 1 %
HEMATOCRIT: 41.7 % (ref 34.0–46.6)
Hemoglobin: 13.9 g/dL (ref 11.1–15.9)
Immature Grans (Abs): 0 10*3/uL (ref 0.0–0.1)
Immature Granulocytes: 0 %
LYMPHS ABS: 3.8 10*3/uL — AB (ref 0.7–3.1)
Lymphs: 32 %
MCH: 31.6 pg (ref 26.6–33.0)
MCHC: 33.3 g/dL (ref 31.5–35.7)
MCV: 95 fL (ref 79–97)
MONOS ABS: 0.5 10*3/uL (ref 0.1–0.9)
Monocytes: 5 %
NEUTROS ABS: 7.4 10*3/uL — AB (ref 1.4–7.0)
Neutrophils: 62 %
PLATELETS: 257 10*3/uL (ref 150–379)
RBC: 4.4 x10E6/uL (ref 3.77–5.28)
RDW: 14.1 % (ref 12.3–15.4)
Rhuematoid fact SerPl-aCnc: <10 IU/mL (ref 0.0–13.9)
SED RATE: 9 mm/h (ref 0–32)
Uric Acid: 5.5 mg/dL (ref 2.5–7.1)
WBC: 11.9 10*3/uL — AB (ref 3.4–10.8)

## 2016-11-22 LAB — LIPID PANEL
Chol/HDL Ratio: 5.3 ratio units — ABNORMAL HIGH (ref 0.0–4.4)
Cholesterol, Total: 289 mg/dL — ABNORMAL HIGH (ref 100–199)
HDL: 55 mg/dL (ref >39)
LDL Calculated: 181 mg/dL — ABNORMAL HIGH (ref 0–99)
Triglycerides: 263 mg/dL — ABNORMAL HIGH (ref 0–149)
VLDL Cholesterol Cal: 53 mg/dL — ABNORMAL HIGH (ref 5–40)

## 2016-11-22 LAB — CMP14+EGFR
ALT: 16 IU/L (ref 0–32)
AST: 11 IU/L (ref 0–40)
Albumin/Globulin Ratio: 1.4 (ref 1.2–2.2)
Albumin: 3.7 g/dL (ref 3.5–5.5)
Alkaline Phosphatase: 47 IU/L (ref 39–117)
BUN/Creatinine Ratio: 23 (ref 9–23)
BUN: 13 mg/dL (ref 6–24)
CHLORIDE: 103 mmol/L (ref 96–106)
CO2: 24 mmol/L (ref 18–29)
CREATININE: 0.56 mg/dL — AB (ref 0.57–1.00)
Calcium: 8.5 mg/dL — ABNORMAL LOW (ref 8.7–10.2)
GFR calc non Af Amer: 112 mL/min/{1.73_m2} (ref >59)
GFR, EST AFRICAN AMERICAN: 129 mL/min/{1.73_m2} (ref >59)
Globulin, Total: 2.7 g/dL (ref 1.5–4.5)
Glucose: 110 mg/dL — ABNORMAL HIGH (ref 65–99)
Potassium: 4.2 mmol/L (ref 3.5–5.2)
Sodium: 143 mmol/L (ref 134–144)
TOTAL PROTEIN: 6.4 g/dL (ref 6.0–8.5)

## 2016-11-22 LAB — C-PEPTIDE: C PEPTIDE: 4.7 ng/mL — AB (ref 1.1–4.4)

## 2016-11-22 LAB — TSH+FREE T4
FREE T4: 1.27 ng/dL (ref 0.82–1.77)
TSH: 0.827 u[IU]/mL (ref 0.450–4.500)

## 2016-11-22 LAB — ANTI-DNA ANTIBODY, DOUBLE-STRANDED: dsDNA Ab: 1 IU/mL (ref 0–9)

## 2016-11-23 ENCOUNTER — Telehealth: Payer: Self-pay

## 2016-11-23 ENCOUNTER — Encounter: Payer: Self-pay | Admitting: Family Medicine

## 2016-11-23 MED ORDER — ATORVASTATIN CALCIUM 40 MG PO TABS: 40.0000 mg | ORAL_TABLET | Freq: Every day | ORAL | 1 refills | Status: DC

## 2016-11-23 NOTE — Telephone Encounter (Signed)
Spoke with patient earlier- patient wanted to know the status of her referral to infectious disease since her insurance goes out the end of the month. Spoke with referral dept and records were sent to [facility] 2/27 and they should be contacting patient. Tried to contact patient letting her know to call [facility] at [phone] and ask for Infectious Disease. She can let them know what is going on with her insurance and see if they have attempted to contact her to make an appointment. Tried to contact patient and lmtcb.

## 2016-11-23 NOTE — Addendum Note (Signed)
Addended by: Nigel Berthold C on: 11/23/2016 11:45 AM   Modules accepted: Orders

## 2016-11-24 ENCOUNTER — Ambulatory Visit (INDEPENDENT_AMBULATORY_CARE_PROVIDER_SITE_OTHER): Payer: BLUE CROSS/BLUE SHIELD | Admitting: Orthopaedic Surgery

## 2016-11-24 ENCOUNTER — Encounter (INDEPENDENT_AMBULATORY_CARE_PROVIDER_SITE_OTHER): Payer: Self-pay | Admitting: Orthopaedic Surgery

## 2016-11-24 VITALS — BP 134/87 | HR 87 | Ht 68.0 in | Wt 217.0 lb

## 2016-11-24 DIAGNOSIS — Z981 Arthrodesis status: Secondary | ICD-10-CM
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
## 2016-11-25 DIAGNOSIS — H90A31 Mixed conductive and sensorineural hearing loss, unilateral, right ear with restricted hearing on the contralateral side: Secondary | ICD-10-CM | POA: Diagnosis not present

## 2016-11-26 ENCOUNTER — Ambulatory Visit
Admission: RE | Admit: 2016-11-26 | Discharge: 2016-11-26 | Disposition: A | Discharge status: Home / Self Care | Payer: BLUE CROSS/BLUE SHIELD | Source: Ambulatory Visit | Attending: Neurosurgery | Admitting: Neurosurgery

## 2016-11-26 DIAGNOSIS — M5126 Other intervertebral disc displacement, lumbar region: Secondary | ICD-10-CM

## 2016-11-27 ENCOUNTER — Other Ambulatory Visit: Payer: Self-pay | Admitting: Family Medicine

## 2016-11-28 DIAGNOSIS — G5623 Lesion of ulnar nerve, bilateral upper limbs: Secondary | ICD-10-CM | POA: Diagnosis not present

## 2016-11-29 ENCOUNTER — Encounter: Payer: Self-pay | Admitting: Family Medicine

## 2016-11-29 DIAGNOSIS — M797 Fibromyalgia: Secondary | ICD-10-CM | POA: Insufficient documentation

## 2016-11-30 ENCOUNTER — Other Ambulatory Visit: Payer: Self-pay | Admitting: Adult Health

## 2016-11-30 ENCOUNTER — Other Ambulatory Visit: Payer: Self-pay | Admitting: Family Medicine

## 2016-11-30 NOTE — Telephone Encounter (Signed)
PLease refill as requested. ( I keep getting an error message) Thanks, WS

## 2016-12-01 ENCOUNTER — Encounter: Payer: Self-pay | Admitting: Family Medicine

## 2016-12-01 NOTE — Telephone Encounter (Signed)
Please let the patient know her MRI showed no serious abnormality. Please send a copy of the report and a copy of her most recent problem list to her law year. I suppose you'll have to get the address and name of the lawyer from her.

## 2016-12-01 NOTE — Telephone Encounter (Signed)
TC to [facility] to change her Gabapentin to a 90 day supply. Unable to do this electronically.

## 2016-12-02 ENCOUNTER — Encounter: Payer: Self-pay | Admitting: Family Medicine

## 2016-12-04 ENCOUNTER — Other Ambulatory Visit: Payer: Self-pay | Admitting: Family Medicine

## 2016-12-05 ENCOUNTER — Telehealth: Payer: Self-pay | Admitting: Family Medicine

## 2016-12-05 ENCOUNTER — Other Ambulatory Visit: Payer: Self-pay | Admitting: Family Medicine

## 2016-12-05 DIAGNOSIS — R197 Diarrhea, unspecified: Secondary | ICD-10-CM

## 2016-12-05 DIAGNOSIS — Z6841 Body Mass Index (BMI) 40.0 and over, adult: Secondary | ICD-10-CM | POA: Diagnosis not present

## 2016-12-05 DIAGNOSIS — M5416 Radiculopathy, lumbar region: Secondary | ICD-10-CM | POA: Diagnosis not present

## 2016-12-05 DIAGNOSIS — M542 Cervicalgia: Secondary | ICD-10-CM | POA: Diagnosis not present

## 2016-12-05 DIAGNOSIS — K529 Noninfective gastroenteritis and colitis, unspecified: Secondary | ICD-10-CM

## 2016-12-05 NOTE — Telephone Encounter (Signed)
Pt notified of referral She will call to schedule appt

## 2016-12-06 ENCOUNTER — Encounter (INDEPENDENT_AMBULATORY_CARE_PROVIDER_SITE_OTHER): Payer: Self-pay | Admitting: Orthopaedic Surgery

## 2016-12-06 ENCOUNTER — Other Ambulatory Visit: Payer: Self-pay | Admitting: *Deleted

## 2016-12-06 MED ORDER — CYANOCOBALAMIN 1000 MCG/ML IJ SOLN: 1000.0000 ug | INTRAMUSCULAR | 0 refills | Status: DC

## 2016-12-08 ENCOUNTER — Ambulatory Visit (INDEPENDENT_AMBULATORY_CARE_PROVIDER_SITE_OTHER): Payer: BLUE CROSS/BLUE SHIELD | Admitting: Orthopaedic Surgery

## 2016-12-08 ENCOUNTER — Other Ambulatory Visit: Payer: Self-pay | Admitting: Family Medicine

## 2016-12-08 DIAGNOSIS — E119 Type 2 diabetes mellitus without complications: Secondary | ICD-10-CM | POA: Diagnosis not present

## 2016-12-08 DIAGNOSIS — H52223 Regular astigmatism, bilateral: Secondary | ICD-10-CM | POA: Diagnosis not present

## 2016-12-08 DIAGNOSIS — H524 Presbyopia: Secondary | ICD-10-CM | POA: Diagnosis not present

## 2016-12-08 NOTE — Telephone Encounter (Signed)
Debbi do you know anything about this prior auth?

## 2016-12-09 ENCOUNTER — Encounter: Payer: Self-pay | Admitting: Family Medicine

## 2016-12-12 NOTE — Telephone Encounter (Signed)
Spoke with pt -states she has been speaking to [facility];  [facility] and they are unaware of the situation; Spoke with pt again to let her know this

## 2016-12-13 DIAGNOSIS — H903 Sensorineural hearing loss, bilateral: Secondary | ICD-10-CM | POA: Diagnosis not present

## 2017-01-23 ENCOUNTER — Encounter: Payer: Self-pay | Admitting: Family Medicine

## 2017-01-23 NOTE — Telephone Encounter (Signed)
I suggest she increase the metformin to 2 daily. If glucose not down in 3-4 weeks will have to be seen. Thanks, WS

## 2017-01-24 ENCOUNTER — Encounter: Payer: Self-pay | Admitting: Family Medicine

## 2017-01-24 ENCOUNTER — Other Ambulatory Visit: Payer: Self-pay

## 2017-01-24 MED ORDER — METFORMIN HCL 500 MG PO TABS: ORAL_TABLET | ORAL | 1 refills | Status: DC

## 2017-01-26 NOTE — Progress Notes (Signed)
Ov note mailed

## 2017-02-15 ENCOUNTER — Other Ambulatory Visit: Payer: Self-pay | Admitting: Family Medicine

## 2017-02-21 ENCOUNTER — Encounter: Payer: Self-pay | Admitting: Family Medicine

## 2017-02-22 NOTE — Telephone Encounter (Signed)
It is virtually unheard of in people with healthy kidneys like yours t have gadolinium toxicity. The reason is that the gadolinium is bonded to a secondary agent that prevents it from penetrating the tissues effectiviely. I have never ordered this evaluation. I could refer you to one of th clinical radiologists if you want further information or evaluation.  Best Regards, Claretta Fraise

## 2017-02-23 NOTE — Telephone Encounter (Signed)
Can you check to see if any of the clinical radiologists can evaluate a patient for gadolinium toxicity? Thanks, WS

## 2017-03-31 ENCOUNTER — Ambulatory Visit: Payer: BLUE CROSS/BLUE SHIELD | Admitting: Family Medicine

## 2017-03-31 ENCOUNTER — Telehealth: Payer: Self-pay | Admitting: Family Medicine

## 2017-03-31 NOTE — Telephone Encounter (Signed)
appt scheduled

## 2017-04-06 ENCOUNTER — Ambulatory Visit (INDEPENDENT_AMBULATORY_CARE_PROVIDER_SITE_OTHER): Payer: Self-pay | Admitting: Nurse Practitioner

## 2017-04-06 ENCOUNTER — Encounter: Payer: Self-pay | Admitting: Nurse Practitioner

## 2017-04-06 VITALS — BP 116/74 | HR 95 | Temp 98.9°F | Ht 68.0 in | Wt 208.0 lb

## 2017-04-06 DIAGNOSIS — F331 Major depressive disorder, recurrent, moderate: Secondary | ICD-10-CM

## 2017-04-06 DIAGNOSIS — M25552 Pain in left hip: Secondary | ICD-10-CM

## 2017-04-06 DIAGNOSIS — M545 Low back pain, unspecified: Secondary | ICD-10-CM

## 2017-04-06 MED ORDER — CYCLOBENZAPRINE HCL 10 MG PO TABS: 10.0000 mg | ORAL_TABLET | Freq: Three times a day (TID) | ORAL | 1 refills | Status: DC | PRN

## 2017-04-06 MED ORDER — NAPROXEN 500 MG PO TABS: 500.0000 mg | ORAL_TABLET | Freq: Two times a day (BID) | ORAL | 1 refills | Status: DC
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
## 2017-04-06 NOTE — Patient Instructions (Signed)
Hip Pain The hip is the joint between the upper legs and the lower pelvis. The bones, cartilage, tendons, and muscles of your hip joint support your body and allow you to move around. Hip pain can range from a minor ache to severe pain in one or both of your hips. The pain may be felt on the inside of the hip joint near the groin, or the outside near the buttocks and upper thigh. You may also have swelling or stiffness. Follow these instructions at home: Managing pain, stiffness, and swelling   If directed, apply ice to the injured area.  Put ice in a plastic bag.  Place a towel between your skin and the bag.  Leave the ice on for 20 minutes, 2-3 times a day  Sleep with a pillow between your legs on your most comfortable side.  Avoid any activities that cause pain. General instructions   Take over-the-counter and prescription medicines only as told by your health care provider.  Do any exercises as told by your health care provider.  Record the following:  How often you have hip pain.  The location of your pain.  What the pain feels like.  What makes the pain worse.  Keep all follow-up visits as told by your health care provider. This is important. Contact a health care provider if:  You cannot put weight on your leg.  Your pain or swelling continues or gets worse after one week.  It gets harder to walk.  You have a fever. Get help right away if:  You fall.  You have a sudden increase in pain and swelling in your hip.  Your hip is red or swollen or very tender to touch. Summary  Hip pain can range from a minor ache to severe pain in one or both of your hips.  The pain may be felt on the inside of the hip joint near the groin, or the outside near the buttocks and upper thigh.  Avoid any activities that cause pain.  Record how often you have hip pain, the location of the pain, what makes it worse and what it feels like. This information is not intended to  replace advice given to you by your health care provider. Make sure you discuss any questions you have with your health care provider. Document Released: 02/16/2010 Document Revised: 08/01/2016 Document Reviewed: 08/01/2016 Elsevier Interactive Patient Education  2017 Elsevier Inc.  

## 2017-04-18 ENCOUNTER — Telehealth (HOSPITAL_COMMUNITY): Payer: Self-pay

## 2017-04-18 ENCOUNTER — Encounter: Payer: Self-pay | Admitting: Family Medicine

## 2017-04-18 ENCOUNTER — Ambulatory Visit (INDEPENDENT_AMBULATORY_CARE_PROVIDER_SITE_OTHER): Payer: Self-pay | Admitting: Family Medicine

## 2017-04-18 VITALS — BP 113/82 | HR 100 | Temp 99.3°F | Ht 68.0 in | Wt 207.0 lb

## 2017-04-18 DIAGNOSIS — M545 Low back pain, unspecified: Secondary | ICD-10-CM

## 2017-04-18 MED ORDER — METHYLPREDNISOLONE ACETATE 80 MG/ML IJ SUSP
80.0000 mg | Freq: Once | INTRAMUSCULAR | Status: AC
  Administered 2017-04-18: 80 mg via INTRAMUSCULAR

## 2017-04-18 NOTE — Patient Instructions (Signed)
Great to see you!  Schedule physical therapy as soon as you can, please let me know if you'd like a short course of prednisone.
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
## 2017-04-18 NOTE — Telephone Encounter (Signed)
Pt states she called [place] office and cx the eval there. NF 04/18/17

## 2017-04-21 ENCOUNTER — Encounter: Payer: Self-pay | Admitting: Family Medicine

## 2017-04-21 ENCOUNTER — Other Ambulatory Visit: Payer: Self-pay | Admitting: Physician Assistant

## 2017-04-21 MED ORDER — PREDNISONE 10 MG (21) PO TBPK: ORAL_TABLET | ORAL | 0 refills | Status: DC

## 2017-04-26 ENCOUNTER — Ambulatory Visit (HOSPITAL_COMMUNITY): Payer: BLUE CROSS/BLUE SHIELD | Attending: Family Medicine

## 2017-04-26 ENCOUNTER — Encounter (HOSPITAL_COMMUNITY): Payer: Self-pay

## 2017-04-26 DIAGNOSIS — R29898 Other symptoms and signs involving the musculoskeletal system: Secondary | ICD-10-CM | POA: Insufficient documentation

## 2017-04-26 DIAGNOSIS — R293 Abnormal posture: Secondary | ICD-10-CM

## 2017-04-26 DIAGNOSIS — M5442 Lumbago with sciatica, left side: Secondary | ICD-10-CM | POA: Insufficient documentation

## 2017-04-26 DIAGNOSIS — M6281 Muscle weakness (generalized): Secondary | ICD-10-CM | POA: Insufficient documentation

## 2017-04-26 NOTE — Patient Instructions (Signed)
  LOWER TRUNK ROTATIONS - LTR  Lying on your back with your knees bent, gently move your knees side-to-side.  Perform 2x/day, 2 sets of 10 reps holding for 5-10 seconds on each side   Repeated extension in standing - REIS  Start in standing with back facing counter top. Can support yourself with hands on counter as needed. Lean backwards to end range then return to starting position, moving with controlled speed. Repeat as directed.   Perform every 2-3 hours, 15-20 reps at the counter
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
## 2017-04-27 ENCOUNTER — Telehealth: Payer: Self-pay | Admitting: Family Medicine

## 2017-04-28 ENCOUNTER — Ambulatory Visit (HOSPITAL_COMMUNITY): Payer: BLUE CROSS/BLUE SHIELD | Admitting: Physical Therapy

## 2017-04-28 NOTE — Telephone Encounter (Signed)
Attempted to contact patient - NVM 

## 2017-05-01 ENCOUNTER — Encounter: Payer: Self-pay | Admitting: Family Medicine

## 2017-05-01 ENCOUNTER — Ambulatory Visit: Payer: BLUE CROSS/BLUE SHIELD | Admitting: Physical Therapy

## 2017-05-01 ENCOUNTER — Ambulatory Visit (HOSPITAL_COMMUNITY): Payer: BLUE CROSS/BLUE SHIELD | Admitting: Physical Therapy

## 2017-05-02 ENCOUNTER — Encounter: Payer: Self-pay | Admitting: Family Medicine

## 2017-05-04 ENCOUNTER — Encounter (HOSPITAL_COMMUNITY): Payer: BLUE CROSS/BLUE SHIELD | Admitting: Physical Therapy

## 2017-05-08 ENCOUNTER — Ambulatory Visit: Payer: No Typology Code available for payment source | Attending: Family Medicine | Admitting: Physical Therapy

## 2017-05-08 DIAGNOSIS — M5442 Lumbago with sciatica, left side: Secondary | ICD-10-CM | POA: Diagnosis present

## 2017-05-08 DIAGNOSIS — R293 Abnormal posture: Secondary | ICD-10-CM | POA: Diagnosis present

## 2017-05-08 DIAGNOSIS — M542 Cervicalgia: Secondary | ICD-10-CM | POA: Insufficient documentation

## 2017-05-08 DIAGNOSIS — M545 Low back pain: Secondary | ICD-10-CM | POA: Diagnosis not present

## 2017-05-08 DIAGNOSIS — M6281 Muscle weakness (generalized): Secondary | ICD-10-CM | POA: Diagnosis present

## 2017-05-08 DIAGNOSIS — R29898 Other symptoms and signs involving the musculoskeletal system: Secondary | ICD-10-CM | POA: Diagnosis present

## 2017-05-08 DIAGNOSIS — G8929 Other chronic pain: Secondary | ICD-10-CM | POA: Diagnosis present
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
## 2017-05-11 ENCOUNTER — Ambulatory Visit: Payer: No Typology Code available for payment source | Admitting: *Deleted

## 2017-05-11 DIAGNOSIS — R293 Abnormal posture: Secondary | ICD-10-CM

## 2017-05-11 DIAGNOSIS — M542 Cervicalgia: Secondary | ICD-10-CM

## 2017-05-11 DIAGNOSIS — M545 Low back pain: Principal | ICD-10-CM

## 2017-05-11 DIAGNOSIS — M5442 Lumbago with sciatica, left side: Secondary | ICD-10-CM

## 2017-05-11 DIAGNOSIS — M6281 Muscle weakness (generalized): Secondary | ICD-10-CM

## 2017-05-11 DIAGNOSIS — R29898 Other symptoms and signs involving the musculoskeletal system: Secondary | ICD-10-CM

## 2017-05-11 DIAGNOSIS — G8929 Other chronic pain: Secondary | ICD-10-CM
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
## 2017-05-12 ENCOUNTER — Encounter: Payer: Self-pay | Admitting: Family Medicine

## 2017-05-12 ENCOUNTER — Ambulatory Visit (INDEPENDENT_AMBULATORY_CARE_PROVIDER_SITE_OTHER): Payer: Self-pay | Admitting: Family Medicine

## 2017-05-12 VITALS — BP 103/67 | HR 86 | Temp 98.8°F | Ht 68.0 in | Wt 204.0 lb

## 2017-05-12 DIAGNOSIS — M5416 Radiculopathy, lumbar region: Secondary | ICD-10-CM

## 2017-05-12 DIAGNOSIS — F331 Major depressive disorder, recurrent, moderate: Secondary | ICD-10-CM

## 2017-05-12 DIAGNOSIS — N3090 Cystitis, unspecified without hematuria: Secondary | ICD-10-CM

## 2017-05-12 DIAGNOSIS — R300 Dysuria: Secondary | ICD-10-CM

## 2017-05-12 LAB — URINALYSIS
BILIRUBIN UA: NEGATIVE
Nitrite, UA: POSITIVE — AB
Urobilinogen, Ur: 4 mg/dL — ABNORMAL HIGH (ref 0.2–1.0)
pH, UA: 5 (ref 5.0–7.5)

## 2017-05-12 MED ORDER — PREDNISONE 10 MG PO TABS: ORAL_TABLET | ORAL | 0 refills | Status: DC

## 2017-05-12 MED ORDER — SULFAMETHOXAZOLE-TRIMETHOPRIM 800-160 MG PO TABS: 1.0000 | ORAL_TABLET | Freq: Two times a day (BID) | ORAL | 0 refills | Status: DC

## 2017-05-12 MED ORDER — BLOOD GLUCOSE MONITOR SYSTEM W/DEVICE KIT: 1.0000 | PACK | Freq: Two times a day (BID) | 0 refills | Status: DC
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
## 2017-05-14 LAB — URINE CULTURE

## 2017-05-16 ENCOUNTER — Encounter: Payer: Self-pay | Admitting: Family Medicine

## 2017-05-17 ENCOUNTER — Ambulatory Visit: Payer: No Typology Code available for payment source | Attending: Family Medicine | Admitting: Physical Therapy

## 2017-05-17 ENCOUNTER — Encounter: Payer: Self-pay | Admitting: Physical Therapy

## 2017-05-17 DIAGNOSIS — G8929 Other chronic pain: Secondary | ICD-10-CM | POA: Diagnosis present

## 2017-05-17 DIAGNOSIS — M5442 Lumbago with sciatica, left side: Secondary | ICD-10-CM | POA: Diagnosis present

## 2017-05-17 DIAGNOSIS — R293 Abnormal posture: Secondary | ICD-10-CM | POA: Diagnosis present

## 2017-05-17 DIAGNOSIS — M545 Low back pain: Secondary | ICD-10-CM | POA: Insufficient documentation

## 2017-05-17 DIAGNOSIS — M6281 Muscle weakness (generalized): Secondary | ICD-10-CM | POA: Insufficient documentation

## 2017-05-17 DIAGNOSIS — R29898 Other symptoms and signs involving the musculoskeletal system: Secondary | ICD-10-CM | POA: Insufficient documentation
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
## 2017-05-22 NOTE — Telephone Encounter (Signed)
Patient reviewed results

## 2017-05-23 ENCOUNTER — Ambulatory Visit: Payer: No Typology Code available for payment source | Admitting: *Deleted

## 2017-05-23 ENCOUNTER — Other Ambulatory Visit: Payer: Self-pay | Admitting: Family Medicine

## 2017-05-23 DIAGNOSIS — M6281 Muscle weakness (generalized): Secondary | ICD-10-CM

## 2017-05-23 DIAGNOSIS — G8929 Other chronic pain: Secondary | ICD-10-CM

## 2017-05-23 DIAGNOSIS — R293 Abnormal posture: Secondary | ICD-10-CM

## 2017-05-23 DIAGNOSIS — M5442 Lumbago with sciatica, left side: Secondary | ICD-10-CM

## 2017-05-23 DIAGNOSIS — M545 Low back pain: Principal | ICD-10-CM

## 2017-05-23 DIAGNOSIS — R29898 Other symptoms and signs involving the musculoskeletal system: Secondary | ICD-10-CM

## 2017-05-23 MED ORDER — TIZANIDINE HCL 4 MG PO TABS: 4.0000 mg | ORAL_TABLET | Freq: Four times a day (QID) | ORAL | 1 refills | Status: DC | PRN
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
## 2017-05-25 ENCOUNTER — Ambulatory Visit: Payer: No Typology Code available for payment source | Admitting: Physical Therapy

## 2017-05-25 DIAGNOSIS — M545 Low back pain: Secondary | ICD-10-CM | POA: Diagnosis not present

## 2017-05-25 DIAGNOSIS — M5442 Lumbago with sciatica, left side: Secondary | ICD-10-CM

## 2017-05-25 DIAGNOSIS — G8929 Other chronic pain: Secondary | ICD-10-CM

## 2017-05-25 NOTE — Patient Instructions (Signed)
[facility]  DRY NEEDLING CONSENT FORM   Trigger point dry needling is a physical therapy approach to treat Myofascial Pain and Dysfunction.  Dry Needling (DN) is a valuable and effective way to deactivate myofascial trigger points (muscle knots/pain). It is skilled intervention that uses a thin filiform needle to penetrate the skin and stimulate underlying myofascial trigger points, muscular, and connective tissues for the management of neuromusculoskeletal pain and movement impairments.  A local twitch response (LTR) will be elicited.  This can sometimes feel like a deep ache in the muscle during the procedure. Multiple trigger points in multiple muscles can be treated during each treatment.  No medication of any kind is injected.   As with any medical treatment and procedure, there are possible adverse events.  While significant adverse events are uncommon, they do sometimes occur and must be considered prior to giving consent.  1. Dry needling often causes a "post needling soreness".  There can be an increase in pain from a couple of hours to 2-3 days, followed by an improvement in the overall pain state. 2. Any time a needle is used there is a risk of infection.  However, we are using new, sterile, and disposable needles; infections are extremely rare. 3. There is a possibility that you may bleed or bruise.  You may feel tired and some nausea following treatment. 4. There is a rare possibility of a pneumothorax (air in the chest cavity). 5. Allergic reaction to nickel in the stainless steel needle. 6. If a nerve is touched, it may cause paresthesia (a prickling/shock sensation) which is usually brief, but may continue for a couple of days.  Following treatment stay hydrated.  Continue regular activities but not too vigorous initially after treatment for 24-48 hours.  Dry Needling is best when combined with other physical therapy interventions such as  strengthening, stretching and other therapeutic modalities.   PLEASE ANSWER THE FOLLOWING QUESTIONS:  Do you have a lack of sensation?   Y/N  Do you have a phobia or fear of needles  Y/N  Are you pregnant?    Y/N If yes:  How many weeks? __________ Do you have any implanted devices?  Y/N If yes:  Pacemaker/Spinal Cord Stimulator/Deep Brain Stimulator/Insulin Pump/Other: ________________ Do you have any implants?  Y/N If yes: Breast/Facial/Pecs/Buttocks/Calves/Hip  Replacement/ Knee Replacement/Other: _________ Do you take any blood thinners?   Y/N If yes: Coumadin (Warfarin)/Other: ___________________ Do you have a bleeding disorder?   Y/N If yes: What kind: _________________________________ Do you take any immunosuppressants?  Y/N If yes:   What kind: _________________________________ Do you take anti-inflammatories?   Y/N If yes: What kind: Advil/Aspirin/Other: ________________ Have you ever been diagnosed with Scoliosis? Y/N Have you had back surgery?   Y/N If yes:  Laminectomy/Fusion/Other: ___________________   I have read, or had read to me, the above.  I have had the opportunity to ask any questions.  All of my questions have been answered to my satisfaction and I understand the risks involved with dry needling.  I consent to examination and treatment at [facility], including dry needling, of any and all of my involved and affected muscles.
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
## 2017-05-26 ENCOUNTER — Ambulatory Visit
Admission: RE | Admit: 2017-05-26 | Discharge: 2017-05-26 | Disposition: A | Discharge status: Home / Self Care | Payer: Self-pay | Source: Ambulatory Visit | Attending: Family Medicine | Admitting: Family Medicine

## 2017-05-26 ENCOUNTER — Ambulatory Visit: Payer: Self-pay | Admitting: Family Medicine

## 2017-05-26 DIAGNOSIS — M5416 Radiculopathy, lumbar region: Secondary | ICD-10-CM

## 2017-05-27 ENCOUNTER — Encounter: Payer: Self-pay | Admitting: Family Medicine

## 2017-05-29 ENCOUNTER — Ambulatory Visit (INDEPENDENT_AMBULATORY_CARE_PROVIDER_SITE_OTHER): Payer: Self-pay | Admitting: Family Medicine

## 2017-05-29 ENCOUNTER — Encounter: Payer: Self-pay | Admitting: Family Medicine

## 2017-05-29 VITALS — BP 107/74 | HR 91 | Temp 97.8°F | Ht 68.0 in | Wt 210.0 lb

## 2017-05-29 DIAGNOSIS — M5416 Radiculopathy, lumbar region: Secondary | ICD-10-CM

## 2017-05-29 MED ORDER — TIZANIDINE HCL 6 MG PO CAPS: 6.0000 mg | ORAL_CAPSULE | Freq: Four times a day (QID) | ORAL | 2 refills | Status: DC

## 2017-05-29 NOTE — Progress Notes (Addendum)
Subjective:  Patient ID: Aimee [name], female    DOB: 02/26/1971  Age: 46 y.o. MRN: [id number]  CC: Follow-up (2 week - low back and hip pain)   HPI Aimee [name] presents for continued paidiating down the left leg from the lower back. She describes it as a pinching. This creates 9/10 pain. She reports Zanaflex has not been helping with that. She was given some Biofreeze like medication by another physician and that helps a little bit. It is worse with ambulation. Pain is rather constant though.  Depression screen [facility] 2/9 05/29/2017 05/12/2017 04/18/2017  Decreased Interest 1 3 3   Down, Depressed, Hopeless 0 3 3  PHQ - 2 Score 1 6 6   Altered sleeping - 3 3  Tired, decreased energy - 3 3  Change in appetite - 3 3  Feeling bad or failure about yourself  - 3 3  Trouble concentrating - 3 3  Moving slowly or fidgety/restless - 3 3  Suicidal thoughts - 0 0  PHQ-9 Score - 24 24  Difficult doing work/chores - Very difficult Very difficult  Some recent data might be hidden    History Aimee [name] has a past medical history of Depression; Diabetes mellitus; Hypertriglyceridemia; and PTSD (post-traumatic stress disorder).   She has a past surgical history that includes Abdominoplasty (2006); Inner ear surgery; Thoracic disc surgery (2011, 2012); Abdominal hysterectomy (2010); Cesarean section (1991, 1995); Shoulder arthroscopy (Left, 10/2015); and Breast excisional biopsy.   Her family history includes Breast cancer in her maternal aunt; Cancer in her maternal aunt and maternal grandmother; Diabetes in her father; GI problems in her mother; Heart attack in her maternal grandfather, paternal grandfather, paternal grandmother, and paternal uncle.She reports that she has been smoking Cigarettes.  She has a 23.00 pack-year smoking history. She has never used smokeless tobacco. She reports that she does not drink alcohol or use drugs.    ROS Review of Systems  Constitutional: Negative for activity  change, appetite change and fever.  HENT: Negative for congestion, rhinorrhea and sore throat.   Eyes: Negative for visual disturbance.  Respiratory: Negative for cough and shortness of breath.   Cardiovascular: Negative for chest pain and palpitations.  Gastrointestinal: Negative for abdominal pain, diarrhea and nausea.  Genitourinary: Negative for dysuria.  Musculoskeletal: Positive for back pain. Negative for arthralgias and myalgias.    Objective:  BP 107/74 (BP Location: Left Arm)   Pulse 91   Temp 97.8 F (36.6 C) (Oral)   Ht 5' 8"  (1.727 m)   Wt 210 lb (95.3 kg)   BMI 31.93 kg/m   BP Readings from Last 3 Encounters:  05/29/17 107/74  05/12/17 103/67  04/18/17 113/82    Wt Readings from Last 3 Encounters:  05/29/17 210 lb (95.3 kg)  05/12/17 204 lb (92.5 kg)  04/18/17 207 lb (93.9 kg)     Physical Exam  Constitutional: She appears well-developed and well-nourished.  HENT:  Head: Normocephalic.  Cardiovascular: Normal rate and regular rhythm.   No murmur heard. Pulmonary/Chest: Effort normal and breath sounds normal.  Musculoskeletal: Normal range of motion. She exhibits tenderness (at lower back and sciatic notch on the left).  Skin: Skin is warm and dry.  Psychiatric: She has a normal mood and affect.      Assessment & Plan:   Aimee [name] was seen today for follow-up.  Diagnoses and all orders for this visit:  Lumbar radiculopathy -     Ambulatory referral to Neurology  Other orders -  tizanidine (ZANAFLEX) 6 MG capsule; Take 1 capsule (6 mg total) by mouth 4 (four) times daily.       I have discontinued Aimee [name]'s tiZANidine. I am also having her start on tizanidine. Additionally, I am having her maintain her mometasone, diclofenac sodium, amLODipine, blood glucose meter kit and supplies, omega-3 acid ethyl esters, atorvastatin, citalopram, cyanocobalamin, metFORMIN, and naproxen.  Allergies as of 05/29/2017      Reactions   Hydrocodone  Itching, Other (See Comments)   And family of drugs   Oxycodone Itching   Tramadol Itching   Mirtazapine Other (See Comments)   xs hunger, foot tapping idiosynchronously   Darvocet [propoxyphene N-acetaminophen] Itching      Medication List       Accurate as of 05/29/17  5:58 PM. Always use your most recent med list.          amLODipine 10 MG tablet Commonly known as:  NORVASC Take 1 tablet (10 mg total) by mouth daily.   atorvastatin 40 MG tablet Commonly known as:  LIPITOR Take 1 tablet (40 mg total) by mouth daily.   blood glucose meter kit and supplies Kit Dispense based on patient and insurance preference. Use up to four times daily as directed. (FOR ICD-9 250.00, 250.01).   citalopram 40 MG tablet Commonly known as:  CELEXA TAKE 1.5 TABLETS (60 MG TOTAL) BY MOUTH DAILY.   cyanocobalamin 1000 MCG/ML injection Commonly known as:  (VITAMIN B-12) Inject 1 mL (1,000 mcg total) into the muscle every 30 (thirty) days.   diclofenac sodium 1 % Gel Commonly known as:  VOLTAREN Apply 4 g topically 4 (four) times daily.   metFORMIN 500 MG tablet Commonly known as:  GLUCOPHAGE Take one tablet in the morning, two tablets at night.   mometasone 0.1 % lotion Commonly known as:  ELOCON APPLY TOPICALLY DAILY.   naproxen 500 MG tablet Commonly known as:  NAPROSYN Take 1 tablet (500 mg total) by mouth 2 (two) times daily with a meal.   omega-3 acid ethyl esters 1 g capsule Commonly known as:  LOVAZA TAKE 2 CAPSULE 2 TIMES A DAY   tizanidine 6 MG capsule Commonly known as:  ZANAFLEX Take 1 capsule (6 mg total) by mouth 4 (four) times daily.            Discharge Care Instructions        Start     Ordered   05/29/17 0000  Ambulatory referral to Neurology    Comments:  An appointment is requested in approximately: 2 weeks - needs PNCV testing -=- has seen [facility] in the past - Dr Trula Ore.   05/29/17 1653   05/29/17 0000  tizanidine (ZANAFLEX) 6 MG capsule  4  times daily     05/29/17 1758       Follow-up: Return in about 1 month (around 06/28/2017).  Claretta Fraise, M.D.

## 2017-05-29 NOTE — Telephone Encounter (Signed)
Exam helps, requires special test,NCV. Will discuss further at office this afternoon. See you then! WS

## 2017-05-29 NOTE — Addendum Note (Signed)
Addended by: Claretta Fraise on: 05/29/2017 05:58 PM   Modules accepted: Orders

## 2017-05-30 ENCOUNTER — Encounter: Payer: Self-pay | Admitting: Physical Therapy

## 2017-05-30 ENCOUNTER — Ambulatory Visit: Payer: No Typology Code available for payment source | Admitting: Physical Therapy

## 2017-05-30 ENCOUNTER — Encounter: Payer: Self-pay | Admitting: Family Medicine

## 2017-05-30 DIAGNOSIS — M5442 Lumbago with sciatica, left side: Secondary | ICD-10-CM

## 2017-05-30 DIAGNOSIS — M545 Low back pain: Principal | ICD-10-CM

## 2017-05-30 DIAGNOSIS — G8929 Other chronic pain: Secondary | ICD-10-CM

## 2017-05-30 MED ORDER — TIZANIDINE HCL 4 MG PO TABS: 6.0000 mg | ORAL_TABLET | Freq: Four times a day (QID) | ORAL | 2 refills | Status: DC | PRN
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
## 2017-06-01 ENCOUNTER — Ambulatory Visit: Payer: No Typology Code available for payment source | Admitting: *Deleted

## 2017-06-01 DIAGNOSIS — M545 Low back pain: Secondary | ICD-10-CM | POA: Diagnosis not present

## 2017-06-01 DIAGNOSIS — R293 Abnormal posture: Secondary | ICD-10-CM

## 2017-06-01 DIAGNOSIS — G8929 Other chronic pain: Secondary | ICD-10-CM

## 2017-06-01 DIAGNOSIS — M6281 Muscle weakness (generalized): Secondary | ICD-10-CM

## 2017-06-01 DIAGNOSIS — M5442 Lumbago with sciatica, left side: Secondary | ICD-10-CM

## 2017-06-01 DIAGNOSIS — R29898 Other symptoms and signs involving the musculoskeletal system: Secondary | ICD-10-CM
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
## 2017-06-04 ENCOUNTER — Other Ambulatory Visit: Payer: BLUE CROSS/BLUE SHIELD

## 2017-06-04 ENCOUNTER — Encounter: Payer: Self-pay | Admitting: Physical Therapy

## 2017-06-06 ENCOUNTER — Ambulatory Visit: Payer: No Typology Code available for payment source | Admitting: Physical Therapy

## 2017-06-08 ENCOUNTER — Ambulatory Visit: Payer: No Typology Code available for payment source | Admitting: *Deleted

## 2017-06-08 DIAGNOSIS — R293 Abnormal posture: Secondary | ICD-10-CM

## 2017-06-08 DIAGNOSIS — M5442 Lumbago with sciatica, left side: Secondary | ICD-10-CM

## 2017-06-08 DIAGNOSIS — G8929 Other chronic pain: Secondary | ICD-10-CM

## 2017-06-08 DIAGNOSIS — M545 Low back pain: Secondary | ICD-10-CM | POA: Diagnosis not present

## 2017-06-08 DIAGNOSIS — M6281 Muscle weakness (generalized): Secondary | ICD-10-CM
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
## 2017-06-13 ENCOUNTER — Ambulatory Visit: Payer: No Typology Code available for payment source | Attending: Family Medicine | Admitting: Physical Therapy

## 2017-06-13 DIAGNOSIS — R29898 Other symptoms and signs involving the musculoskeletal system: Secondary | ICD-10-CM | POA: Diagnosis present

## 2017-06-13 DIAGNOSIS — M6281 Muscle weakness (generalized): Secondary | ICD-10-CM | POA: Diagnosis present

## 2017-06-13 DIAGNOSIS — M545 Low back pain: Secondary | ICD-10-CM | POA: Diagnosis present

## 2017-06-13 DIAGNOSIS — M5442 Lumbago with sciatica, left side: Secondary | ICD-10-CM | POA: Diagnosis not present

## 2017-06-13 DIAGNOSIS — R293 Abnormal posture: Secondary | ICD-10-CM | POA: Diagnosis present

## 2017-06-13 DIAGNOSIS — G8929 Other chronic pain: Secondary | ICD-10-CM | POA: Insufficient documentation
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
## 2017-06-15 ENCOUNTER — Ambulatory Visit: Payer: No Typology Code available for payment source | Admitting: Physical Therapy

## 2017-06-15 DIAGNOSIS — M545 Low back pain: Secondary | ICD-10-CM

## 2017-06-15 DIAGNOSIS — G8929 Other chronic pain: Secondary | ICD-10-CM

## 2017-06-15 DIAGNOSIS — R293 Abnormal posture: Secondary | ICD-10-CM

## 2017-06-15 DIAGNOSIS — M5442 Lumbago with sciatica, left side: Secondary | ICD-10-CM | POA: Diagnosis not present
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
## 2017-06-20 ENCOUNTER — Ambulatory Visit: Payer: No Typology Code available for payment source | Admitting: *Deleted

## 2017-06-20 DIAGNOSIS — G8929 Other chronic pain: Secondary | ICD-10-CM

## 2017-06-20 DIAGNOSIS — R29898 Other symptoms and signs involving the musculoskeletal system: Secondary | ICD-10-CM

## 2017-06-20 DIAGNOSIS — M6281 Muscle weakness (generalized): Secondary | ICD-10-CM

## 2017-06-20 DIAGNOSIS — M545 Low back pain: Secondary | ICD-10-CM

## 2017-06-20 DIAGNOSIS — M5442 Lumbago with sciatica, left side: Secondary | ICD-10-CM

## 2017-06-20 DIAGNOSIS — R293 Abnormal posture: Secondary | ICD-10-CM
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
## 2017-07-26 ENCOUNTER — Telehealth: Payer: Self-pay | Admitting: Family Medicine

## 2017-07-27 ENCOUNTER — Ambulatory Visit (INDEPENDENT_AMBULATORY_CARE_PROVIDER_SITE_OTHER): Payer: BLUE CROSS/BLUE SHIELD | Admitting: Orthopaedic Surgery

## 2017-07-27 ENCOUNTER — Encounter (INDEPENDENT_AMBULATORY_CARE_PROVIDER_SITE_OTHER): Payer: Self-pay | Admitting: Orthopaedic Surgery

## 2017-07-27 VITALS — BP 104/72 | HR 87 | Ht 68.0 in | Wt 204.0 lb

## 2017-07-27 DIAGNOSIS — R200 Anesthesia of skin: Secondary | ICD-10-CM
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
## 2017-07-28 ENCOUNTER — Other Ambulatory Visit: Payer: Self-pay | Admitting: *Deleted

## 2017-07-28 ENCOUNTER — Other Ambulatory Visit: Payer: Self-pay | Admitting: Family Medicine

## 2017-07-28 DIAGNOSIS — L03032 Cellulitis of left toe: Secondary | ICD-10-CM | POA: Diagnosis not present

## 2017-07-28 DIAGNOSIS — L600 Ingrowing nail: Secondary | ICD-10-CM | POA: Diagnosis not present

## 2017-07-28 DIAGNOSIS — M79675 Pain in left toe(s): Secondary | ICD-10-CM | POA: Diagnosis not present

## 2017-07-28 DIAGNOSIS — E1142 Type 2 diabetes mellitus with diabetic polyneuropathy: Secondary | ICD-10-CM

## 2017-07-28 DIAGNOSIS — M797 Fibromyalgia: Secondary | ICD-10-CM

## 2017-07-28 DIAGNOSIS — E781 Pure hyperglyceridemia: Secondary | ICD-10-CM

## 2017-07-28 DIAGNOSIS — I10 Essential (primary) hypertension: Secondary | ICD-10-CM

## 2017-07-28 NOTE — Telephone Encounter (Signed)
Orders placed. Left message

## 2017-07-28 NOTE — Telephone Encounter (Signed)
Please order CBC, CMP, Lipid, A1c, microalbumin & UA dip

## 2017-08-04 ENCOUNTER — Other Ambulatory Visit: Payer: Self-pay | Admitting: Family Medicine

## 2017-08-05 ENCOUNTER — Other Ambulatory Visit: Payer: Self-pay | Admitting: Family Medicine

## 2017-08-06 ENCOUNTER — Other Ambulatory Visit: Payer: Self-pay | Admitting: Family Medicine

## 2017-08-07 ENCOUNTER — Telehealth: Payer: Self-pay | Admitting: Family Medicine

## 2017-08-07 ENCOUNTER — Telehealth (INDEPENDENT_AMBULATORY_CARE_PROVIDER_SITE_OTHER): Payer: Self-pay | Admitting: Orthopaedic Surgery

## 2017-08-07 MED ORDER — BENZONATATE 100 MG PO CAPS: 100.0000 mg | ORAL_CAPSULE | Freq: Two times a day (BID) | ORAL | 0 refills | Status: DC | PRN

## 2017-08-07 NOTE — Telephone Encounter (Signed)
Pt aware of MD feedback.

## 2017-08-07 NOTE — Telephone Encounter (Signed)
Do you have a blue sheet on this one?  If not send back to me, and I will get you one.

## 2017-08-07 NOTE — Telephone Encounter (Signed)
Spoke to pt and she states she has had a complete hysterectomy so I advised pt she really didn't need HPV testing and also HPV testing can only be done through Vaginal/Cervical swab and not bloodwork. Pt voiced understanding and then asked if she could get Tessalon Pearles called in for a cough.

## 2017-08-07 NOTE — Telephone Encounter (Signed)
Severt,Ginamarie  [birth year]    Pt called and needs to be sched for surgery

## 2017-08-09 ENCOUNTER — Encounter: Payer: Self-pay | Admitting: Physician Assistant

## 2017-08-09 ENCOUNTER — Other Ambulatory Visit: Payer: Self-pay | Admitting: *Deleted

## 2017-08-09 ENCOUNTER — Ambulatory Visit (INDEPENDENT_AMBULATORY_CARE_PROVIDER_SITE_OTHER): Payer: BLUE CROSS/BLUE SHIELD

## 2017-08-09 ENCOUNTER — Ambulatory Visit: Payer: BLUE CROSS/BLUE SHIELD | Admitting: Physician Assistant

## 2017-08-09 VITALS — BP 113/79 | HR 96 | Temp 99.3°F | Ht 68.0 in | Wt 200.0 lb

## 2017-08-09 DIAGNOSIS — J189 Pneumonia, unspecified organism: Secondary | ICD-10-CM

## 2017-08-09 DIAGNOSIS — E781 Pure hyperglyceridemia: Secondary | ICD-10-CM

## 2017-08-09 DIAGNOSIS — R058 Other specified cough: Secondary | ICD-10-CM

## 2017-08-09 DIAGNOSIS — M797 Fibromyalgia: Secondary | ICD-10-CM | POA: Diagnosis not present

## 2017-08-09 DIAGNOSIS — I10 Essential (primary) hypertension: Secondary | ICD-10-CM

## 2017-08-09 DIAGNOSIS — R05 Cough: Secondary | ICD-10-CM

## 2017-08-09 DIAGNOSIS — E1142 Type 2 diabetes mellitus with diabetic polyneuropathy: Secondary | ICD-10-CM | POA: Diagnosis not present

## 2017-08-09 LAB — BAYER DCA HB A1C WAIVED: HB A1C (BAYER DCA - WAIVED): 6.1 % (ref <7.0)

## 2017-08-09 MED ORDER — GUAIFENESIN-CODEINE 100-10 MG/5ML PO SYRP: 10.0000 mL | ORAL_SOLUTION | Freq: Three times a day (TID) | ORAL | 0 refills | Status: DC | PRN

## 2017-08-09 MED ORDER — METHYLPREDNISOLONE ACETATE 80 MG/ML IJ SUSP
80.0000 mg | Freq: Once | INTRAMUSCULAR | Status: AC
  Administered 2017-08-09: 80 mg via INTRAMUSCULAR

## 2017-08-09 MED ORDER — ALBUTEROL SULFATE (2.5 MG/3ML) 0.083% IN NEBU: 2.5000 mg | INHALATION_SOLUTION | Freq: Four times a day (QID) | RESPIRATORY_TRACT | 1 refills | Status: DC | PRN

## 2017-08-09 MED ORDER — ALBUTEROL SULFATE HFA 108 (90 BASE) MCG/ACT IN AERS: 2.0000 | INHALATION_SPRAY | Freq: Four times a day (QID) | RESPIRATORY_TRACT | 5 refills | Status: DC | PRN

## 2017-08-09 MED ORDER — CEFDINIR 300 MG PO CAPS: 300.0000 mg | ORAL_CAPSULE | Freq: Two times a day (BID) | ORAL | 0 refills | Status: DC

## 2017-08-09 NOTE — Progress Notes (Signed)
Depo-Medrol 32m given Right Gluteal

## 2017-08-09 NOTE — Patient Instructions (Signed)
In a few days you may receive a survey in the mail or online from [employer] regarding your visit with us today. Please take a moment to fill this out. Your feedback is very important to our whole office. It can help us better understand your needs as well as improve your experience and satisfaction. Thank you for taking your time to complete it. We care about you.  Bridgitt Raggio, PA-C  

## 2017-08-10 LAB — LIPID PANEL
CHOL/HDL RATIO: 2.6 ratio (ref 0.0–4.4)
CHOLESTEROL TOTAL: 122 mg/dL (ref 100–199)
HDL: 47 mg/dL (ref >39)
LDL CALC: 49 mg/dL (ref 0–99)
Triglycerides: 129 mg/dL (ref 0–149)
VLDL Cholesterol Cal: 26 mg/dL (ref 5–40)

## 2017-08-10 LAB — CMP14+EGFR
ALK PHOS: 58 IU/L (ref 39–117)
ALT: 12 IU/L (ref 0–32)
AST: 18 IU/L (ref 0–40)
Albumin/Globulin Ratio: 1.4 (ref 1.2–2.2)
Albumin: 3.9 g/dL (ref 3.5–5.5)
BUN / CREAT RATIO: 11 (ref 9–23)
BUN: 7 mg/dL (ref 6–24)
Bilirubin Total: 0.3 mg/dL (ref 0.0–1.2)
CALCIUM: 9.1 mg/dL (ref 8.7–10.2)
CO2: 25 mmol/L (ref 20–29)
CREATININE: 0.65 mg/dL (ref 0.57–1.00)
Chloride: 104 mmol/L (ref 96–106)
GFR calc Af Amer: 123 mL/min/{1.73_m2} (ref >59)
GFR calc non Af Amer: 107 mL/min/{1.73_m2} (ref >59)
GLUCOSE: 87 mg/dL (ref 65–99)
Globulin, Total: 2.7 g/dL (ref 1.5–4.5)
POTASSIUM: 4.4 mmol/L (ref 3.5–5.2)
SODIUM: 142 mmol/L (ref 134–144)
Total Protein: 6.6 g/dL (ref 6.0–8.5)

## 2017-08-10 LAB — CBC WITH DIFFERENTIAL/PLATELET
Basophils Absolute: 0 10*3/uL (ref 0.0–0.2)
Basos: 0 %
EOS (ABSOLUTE): 0.3 10*3/uL (ref 0.0–0.4)
EOS: 3 %
HEMATOCRIT: 38.9 % (ref 34.0–46.6)
Hemoglobin: 13.5 g/dL (ref 11.1–15.9)
Immature Grans (Abs): 0 10*3/uL (ref 0.0–0.1)
Immature Granulocytes: 0 %
LYMPHS ABS: 2.8 10*3/uL (ref 0.7–3.1)
Lymphs: 25 %
MCH: 32.1 pg (ref 26.6–33.0)
MCHC: 34.7 g/dL (ref 31.5–35.7)
MCV: 92 fL (ref 79–97)
MONOS ABS: 0.4 10*3/uL (ref 0.1–0.9)
Monocytes: 4 %
Neutrophils Absolute: 7.8 10*3/uL — ABNORMAL HIGH (ref 1.4–7.0)
Neutrophils: 68 %
PLATELETS: 295 10*3/uL (ref 150–379)
RBC: 4.21 x10E6/uL (ref 3.77–5.28)
RDW: 13.5 % (ref 12.3–15.4)
WBC: 11.4 10*3/uL — AB (ref 3.4–10.8)

## 2017-08-10 NOTE — Progress Notes (Signed)
BP 113/79   Pulse 96   Temp 99.3 F (37.4 C) (Oral)   Ht 5' 8"  (1.727 m)   Wt 200 lb (90.7 kg)   SpO2 93%   BMI 30.41 kg/m    Subjective:    Patient ID: Aimee [name], female    DOB: November 12, 1970, 46 y.o.   MRN: [id number]  HPI: Loveah Like is a 46 y.o. female presenting on 08/09/2017 for cough and congestion and Fever  Patient with several days of progressing upper respiratory and bronchial symptoms. Initially there was more upper respiratory congestion. This progressed to having significant cough that is productive throughout the day and severe at night. There is occasional wheezing after coughing. Sometimes there is slight dyspnea on exertion. It is productive mucus that is yellow in color. Denies any blood.   Relevant past medical, surgical, family and social history reviewed and updated as indicated. Allergies and medications reviewed and updated.  Past Medical History:  Diagnosis Date  . Depression   . Diabetes mellitus   . Hypertriglyceridemia   . PTSD (post-traumatic stress disorder)     Past Surgical History:  Procedure Laterality Date  . ABDOMINAL HYSTERECTOMY  2010  . ABDOMINOPLASTY  2006  . BREAST EXCISIONAL BIOPSY     right  . [place]   x2  . INNER EAR SURGERY     12 surgeries on R ear starting in 1984; implant 06/2011  . SHOULDER ARTHROSCOPY Left 10/2015  . THORACIC [place] SURGERY  2011, 2012   x2    Review of Systems  Constitutional: Positive for chills and fatigue. Negative for activity change and appetite change.  HENT: Positive for congestion, postnasal drip and sore throat.   Eyes: Negative.   Respiratory: Positive for cough and wheezing.   Cardiovascular: Negative.  Negative for chest pain, palpitations and leg swelling.  Gastrointestinal: Negative.   Genitourinary: Negative.   Musculoskeletal: Negative.   Skin: Negative.   Neurological: Positive for headaches.    Allergies as of 08/09/2017      Reactions   Hydrocodone Itching, Other (See Comments)   And family of drugs   Oxycodone Itching   Tramadol Itching   Mirtazapine Other (See Comments)   xs hunger, foot tapping idiosynchronously   Darvocet [propoxyphene N-acetaminophen] Itching      Medication List        Accurate as of 08/09/17 11:59 PM. Always use your most recent med list.          albuterol (2.5 MG/3ML) 0.083% nebulizer solution Commonly known as:  PROVENTIL Take 3 mLs (2.5 mg total) by nebulization every 6 (six) hours as needed for wheezing or shortness of breath.   albuterol 108 (90 Base) MCG/ACT inhaler Commonly known as:  PROVENTIL HFA;VENTOLIN HFA Inhale 2 puffs into the lungs every 6 (six) hours as needed for wheezing or shortness of breath.   amLODipine 10 MG tablet Commonly known as:  NORVASC TAKE 1 TABLET BY MOUTH ONCE DAILY   atorvastatin 40 MG tablet Commonly known as:  LIPITOR TAKE 1 TABLET (40 MG TOTAL) BY MOUTH DAILY.   benzonatate 100 MG capsule Commonly known as:  TESSALON Take 1 capsule (100 mg total) by mouth 2 (two) times daily as needed for cough.   blood glucose meter kit and supplies Kit Dispense based on patient and insurance preference. Use up to four times daily as directed. (FOR ICD-9 250.00, 250.01).   cefdinir 300 MG capsule Commonly known as:  OMNICEF Take 1 capsule (  300 mg total) by mouth 2 (two) times daily. 1 po BID   citalopram 40 MG tablet Commonly known as:  CELEXA TAKE 1.5 TABLETS (60 MG TOTAL) BY MOUTH DAILY.   cyanocobalamin 1000 MCG/ML injection Commonly known as:  (VITAMIN B-12) Inject 1 mL (1,000 mcg total) into the muscle every 30 (thirty) days.   diclofenac sodium 1 % Gel Commonly known as:  VOLTAREN Apply 4 g topically 4 (four) times daily.   guaiFENesin-codeine 100-10 MG/5ML syrup Commonly known as:  ROBITUSSIN AC Take 10 mLs by mouth 3 (three) times daily as needed for cough.   metFORMIN 500 MG tablet Commonly known as:  GLUCOPHAGE Take one tablet in  the morning, two tablets at night.   mometasone 0.1 % lotion Commonly known as:  ELOCON APPLY TOPICALLY DAILY.   naproxen 500 MG tablet Commonly known as:  NAPROSYN Take 1 tablet (500 mg total) by mouth 2 (two) times daily with a meal.   omega-3 acid ethyl esters 1 g capsule Commonly known as:  LOVAZA TAKE 2 CAPSULE 2 TIMES A DAY   tiZANidine 4 MG tablet Commonly known as:  ZANAFLEX Take 1.5 tablets (6 mg total) by mouth every 6 (six) hours as needed for muscle spasms.          Objective:    BP 113/79   Pulse 96   Temp 99.3 F (37.4 C) (Oral)   Ht 5' 8"  (1.727 m)   Wt 200 lb (90.7 kg)   SpO2 93%   BMI 30.41 kg/m   Allergies  Allergen Reactions  . Hydrocodone Itching and Other (See Comments)    And family of drugs   . Oxycodone Itching  . Tramadol Itching  . Mirtazapine Other (See Comments)    xs hunger, foot tapping idiosynchronously  . Darvocet [Propoxyphene N-Acetaminophen] Itching    Physical Exam  Constitutional: She is oriented to person, place, and time. She appears well-developed and well-nourished.  HENT:  Head: Normocephalic and atraumatic.  Right Ear: There is drainage and tenderness.  Left Ear: There is drainage and tenderness.  Nose: Mucosal edema and rhinorrhea present. Right sinus exhibits maxillary sinus tenderness and frontal sinus tenderness. Left sinus exhibits maxillary sinus tenderness and frontal sinus tenderness.  Mouth/Throat: Oropharyngeal exudate and posterior oropharyngeal erythema present.  Eyes: Conjunctivae and EOM are normal. Pupils are equal, round, and reactive to light.  Neck: Normal range of motion. Neck supple.  Cardiovascular: Normal rate, regular rhythm, normal heart sounds and intact distal pulses.  Pulmonary/Chest: Effort normal. She has wheezes in the right upper field and the left upper field.  Abdominal: Soft. Bowel sounds are normal.  Neurological: She is alert and oriented to person, place, and time. She has normal  reflexes.  Skin: Skin is warm and dry. No rash noted.  Psychiatric: She has a normal mood and affect. Her behavior is normal. Judgment and thought content normal.        Assessment & Plan:   1. Atypical pneumonia - albuterol (PROVENTIL) (2.5 MG/3ML) 0.083% nebulizer solution; Take 3 mLs (2.5 mg total) by nebulization every 6 (six) hours as needed for wheezing or shortness of breath.  Dispense: 150 mL; Refill: 1 - albuterol (PROVENTIL HFA;VENTOLIN HFA) 108 (90 Base) MCG/ACT inhaler; Inhale 2 puffs into the lungs every 6 (six) hours as needed for wheezing or shortness of breath.  Dispense: 1 Inhaler; Refill: 5 - cefdinir (OMNICEF) 300 MG capsule; Take 1 capsule (300 mg total) by mouth 2 (two) times daily. 1 po  BID  Dispense: 20 capsule; Refill: 0 - guaiFENesin-codeine (ROBITUSSIN AC) 100-10 MG/5ML syrup; Take 10 mLs by mouth 3 (three) times daily as needed for cough.  Dispense: 240 mL; Refill: 0 - methylPREDNISolone acetate (DEPO-MEDROL) injection 80 mg  2. Essential hypertension - Bayer DCA Hb A1c Waived - Lipid panel - CMP14+EGFR - CBC with Differential/Platelet  3. Type 2 diabetes mellitus with diabetic polyneuropathy, without long-term current use of insulin (HCC) - Bayer DCA Hb A1c Waived - Lipid panel - CMP14+EGFR - CBC with Differential/Platelet  4. Hypertriglyceridemia - Bayer DCA Hb A1c Waived - Lipid panel - CMP14+EGFR - CBC with Differential/Platelet  5. Severe obesity (BMI >= 40) (HCC) - Bayer DCA Hb A1c Waived - Lipid panel - CMP14+EGFR - CBC with Differential/Platelet  6. Fibromyalgia - Bayer DCA Hb A1c Waived - Lipid panel - CMP14+EGFR - CBC with Differential/Platelet    Current Outpatient Medications:  .  albuterol (PROVENTIL HFA;VENTOLIN HFA) 108 (90 Base) MCG/ACT inhaler, Inhale 2 puffs into the lungs every 6 (six) hours as needed for wheezing or shortness of breath., Disp: 1 Inhaler, Rfl: 5 .  albuterol (PROVENTIL) (2.5 MG/3ML) 0.083% nebulizer  solution, Take 3 mLs (2.5 mg total) by nebulization every 6 (six) hours as needed for wheezing or shortness of breath., Disp: 150 mL, Rfl: 1 .  amLODipine (NORVASC) 10 MG tablet, TAKE 1 TABLET BY MOUTH ONCE DAILY, Disp: 90 tablet, Rfl: 0 .  atorvastatin (LIPITOR) 40 MG tablet, TAKE 1 TABLET (40 MG TOTAL) BY MOUTH DAILY., Disp: 90 tablet, Rfl: 1 .  benzonatate (TESSALON) 100 MG capsule, Take 1 capsule (100 mg total) by mouth 2 (two) times daily as needed for cough., Disp: 20 capsule, Rfl: 0 .  blood glucose meter kit and supplies KIT, Dispense based on patient and insurance preference. Use up to four times daily as directed. (FOR ICD-9 250.00, 250.01)., Disp: 1 each, Rfl: 0 .  cefdinir (OMNICEF) 300 MG capsule, Take 1 capsule (300 mg total) by mouth 2 (two) times daily. 1 po BID, Disp: 20 capsule, Rfl: 0 .  citalopram (CELEXA) 40 MG tablet, TAKE 1.5 TABLETS (60 MG TOTAL) BY MOUTH DAILY., Disp: , Rfl: 1 .  cyanocobalamin (,VITAMIN B-12,) 1000 MCG/ML injection, Inject 1 mL (1,000 mcg total) into the muscle every 30 (thirty) days., Disp: 10 mL, Rfl: 0 .  diclofenac sodium (VOLTAREN) 1 % GEL, Apply 4 g topically 4 (four) times daily. (Patient taking differently: Apply 4 g topically 4 (four) times daily as needed (neck and back pain). ), Disp: 100 Tube, Rfl: 0 .  guaiFENesin-codeine (ROBITUSSIN AC) 100-10 MG/5ML syrup, Take 10 mLs by mouth 3 (three) times daily as needed for cough., Disp: 240 mL, Rfl: 0 .  metFORMIN (GLUCOPHAGE) 500 MG tablet, Take one tablet in the morning, two tablets at night., Disp: 270 tablet, Rfl: 1 .  mometasone (ELOCON) 0.1 % lotion, APPLY TOPICALLY DAILY. (Patient taking differently: Apply 1 application topically daily as needed (for skin irritation (ears)). ), Disp: 180 mL, Rfl: 3 .  naproxen (NAPROSYN) 500 MG tablet, Take 1 tablet (500 mg total) by mouth 2 (two) times daily with a meal., Disp: 60 tablet, Rfl: 1 .  omega-3 acid ethyl esters (LOVAZA) 1 g capsule, TAKE 2 CAPSULE 2  TIMES A DAY, Disp: 120 capsule, Rfl: 5 .  tiZANidine (ZANAFLEX) 4 MG tablet, Take 1.5 tablets (6 mg total) by mouth every 6 (six) hours as needed for muscle spasms., Disp: 180 tablet, Rfl: 2 Continue all other maintenance medications  as listed above.  Follow up plan: Return if symptoms worsen or fail to improve.  Educational handout given for [place] PA-C [place] [address]  [place], [place] [address] [phone]   08/10/2017, 9:31 PM

## 2017-08-11 ENCOUNTER — Ambulatory Visit (INDEPENDENT_AMBULATORY_CARE_PROVIDER_SITE_OTHER): Payer: BLUE CROSS/BLUE SHIELD | Admitting: *Deleted

## 2017-08-11 ENCOUNTER — Telehealth: Payer: Self-pay | Admitting: Family Medicine

## 2017-08-11 DIAGNOSIS — R05 Cough: Secondary | ICD-10-CM

## 2017-08-11 DIAGNOSIS — R059 Cough, unspecified: Secondary | ICD-10-CM

## 2017-08-11 MED ORDER — CEFTRIAXONE SODIUM 1 G IJ SOLR
1.0000 g | Freq: Once | INTRAMUSCULAR | Status: AC
  Administered 2017-08-11: 1 g via INTRAMUSCULAR

## 2017-08-11 NOTE — Telephone Encounter (Signed)
Rocephin 1gram IM, have her come in to get this afternoon

## 2017-08-11 NOTE — Telephone Encounter (Signed)
Patient seen Aimee [name] 11/28. Was prescribed omnicef 369m and robitussin. Patient states she was better the first day but by 5pm yesterday she had her fever again. States her temp was 102 and then went up to 103.1. Patient states she took tylenol and was able to get temp down. Has not taken her temp today. C/o cough and congestion and states she is no better. Please advise and send back to pools.

## 2017-08-11 NOTE — Telephone Encounter (Signed)
Patient aware.

## 2017-08-14 ENCOUNTER — Ambulatory Visit: Payer: BLUE CROSS/BLUE SHIELD | Admitting: Family Medicine

## 2017-08-14 ENCOUNTER — Encounter: Payer: Self-pay | Admitting: Family Medicine

## 2017-08-14 VITALS — BP 102/70 | HR 87 | Temp 97.7°F | Ht 68.0 in | Wt 202.0 lb

## 2017-08-14 DIAGNOSIS — J189 Pneumonia, unspecified organism: Secondary | ICD-10-CM

## 2017-08-14 DIAGNOSIS — F4310 Post-traumatic stress disorder, unspecified: Secondary | ICD-10-CM | POA: Diagnosis not present

## 2017-08-14 DIAGNOSIS — M5416 Radiculopathy, lumbar region: Secondary | ICD-10-CM | POA: Diagnosis not present

## 2017-08-14 DIAGNOSIS — M5136 Other intervertebral disc degeneration, lumbar region: Secondary | ICD-10-CM | POA: Diagnosis not present

## 2017-08-14 DIAGNOSIS — Z9621 Cochlear implant status: Secondary | ICD-10-CM

## 2017-08-14 DIAGNOSIS — E1142 Type 2 diabetes mellitus with diabetic polyneuropathy: Secondary | ICD-10-CM

## 2017-08-14 MED ORDER — TIZANIDINE HCL 4 MG PO TABS: 6.0000 mg | ORAL_TABLET | Freq: Four times a day (QID) | ORAL | 2 refills | Status: DC | PRN

## 2017-08-14 MED ORDER — METFORMIN HCL 500 MG PO TABS: 500.0000 mg | ORAL_TABLET | Freq: Every day | ORAL | 1 refills | Status: DC

## 2017-08-14 MED ORDER — OMEPRAZOLE 20 MG PO CPDR: 20.0000 mg | DELAYED_RELEASE_CAPSULE | Freq: Every day | ORAL | 5 refills | Status: DC

## 2017-08-14 MED ORDER — VARENICLINE TARTRATE 0.5 MG X 11 & 1 MG X 42 PO MISC: ORAL | 0 refills | Status: DC

## 2017-08-14 MED ORDER — EZETIMIBE 10 MG PO TABS: 10.0000 mg | ORAL_TABLET | Freq: Every day | ORAL | 1 refills | Status: DC

## 2017-08-14 MED ORDER — CITALOPRAM HYDROBROMIDE 40 MG PO TABS
ORAL_TABLET | ORAL | 1 refills | Status: DC
Start: 2017-08-14 — End: 2017-08-30

## 2017-08-14 MED ORDER — GUAIFENESIN-CODEINE 100-10 MG/5ML PO SYRP: 10.0000 mL | ORAL_SOLUTION | Freq: Three times a day (TID) | ORAL | 0 refills | Status: DC | PRN

## 2017-08-14 MED ORDER — AMLODIPINE BESYLATE 10 MG PO TABS: 10.0000 mg | ORAL_TABLET | Freq: Every day | ORAL | 1 refills | Status: DC

## 2017-08-14 NOTE — Progress Notes (Signed)
Subjective:  Patient ID: Aimee [name], female    DOB: Oct 16, 1970  Age: 46 y.o. MRN: [id number]  CC: Follow-up (pt here today for routine follow up of her chronic medical conditions)   HPI Aimee [name] presents for follow-up of diabetes. Patient does not check blood sugar at home Patient denies symptoms such as polyuria, polydipsia, excessive hunger, nausea No significant hypoglycemic spells noted. Medications as noted below. Taking them regularly without complication/adverse reaction being reported today.   Follow-up of hypertension. Patient has no history of headache chest pain or shortness of breath or recent cough. Patient also denies symptoms of TIA such as numbness weakness lateralizing. Patient checks  blood pressure at home and has not had any elevated readings recently. Patient denies side effects from  medication. States taking it regularly.   Patient in for follow-up of elevated cholesterol. Doing well without complaints on current medication. Denies side effects of statin including myalgia and arthralgia and nausea. Also in today for liver function testing. Currently no chest pain, shortness of breath or other cardiovascular related symptoms noted.  Patient also was followed for lumbar radiculopathy.  She continues to have low back pain on a daily basis from this.  This is generated by her degenerative disc disease in the lumbar region.  This is exacerbated in turn by her severe obesity.  She has been a long-term suffering from posterior of time posttraumatic stress disorder.  This psychiatric condition has in turn led to the obesity.  Of note is that she does have a bone-anchored hearing aid due to partial deafness.  She was in the last week with respiratory symptoms and diagnosed with an atypical pneumonia.  She continues to take Ceftin ear and Cheratussin.  Depression screen [facility] 2/9 08/09/2017 05/29/2017 05/12/2017  Decreased Interest 0 1 3  Down, Depressed, Hopeless 0 0 3    PHQ - 2 Score 0 1 6  Altered sleeping - - 3  Tired, decreased energy - - 3  Change in appetite - - 3  Feeling bad or failure about yourself  - - 3  Trouble concentrating - - 3  Moving slowly or fidgety/restless - - 3  Suicidal thoughts - - 0  PHQ-9 Score - - 24  Difficult doing work/chores - - Very difficult  Some recent data might be hidden    History [name] has a past medical history of Depression, Diabetes mellitus, Hypertriglyceridemia, and PTSD (post-traumatic stress disorder).   She has a past surgical history that includes Abdominoplasty (2006); Inner ear surgery; Thoracic disc surgery (2011, 2012); Abdominal hysterectomy (2010); Cesarean section (1991, 1995); Shoulder arthroscopy (Left, 10/2015); and Breast excisional biopsy.   Her family history includes Breast cancer in her maternal aunt; Cancer in her maternal aunt and maternal grandmother; Diabetes in her father; GI problems in her mother; Heart attack in her maternal grandfather, paternal grandfather, paternal grandmother, and paternal uncle.She reports that she has been smoking cigarettes.  She has a 23.00 pack-year smoking history. she has never used smokeless tobacco. She reports that she does not drink alcohol or use drugs.    ROS Review of Systems  Constitutional: Positive for activity change. Negative for appetite change and fever.  HENT: Negative for congestion, rhinorrhea and sore throat.   Eyes: Negative for visual disturbance.  Respiratory: Positive for shortness of breath. Negative for cough.   Cardiovascular: Positive for chest pain (Right lower chest onset yesterday). Negative for palpitations.  Gastrointestinal: Negative for abdominal pain, diarrhea and nausea.  Genitourinary: Negative for  dysuria.  Musculoskeletal: Negative for arthralgias and myalgias.    Objective:  BP 102/70   Pulse 87   Temp 97.7 F (36.5 C) (Oral)   Ht 5' 8"  (1.727 m)   Wt 202 lb (91.6 kg)   BMI 30.71 kg/m   BP Readings  from Last 3 Encounters:  08/14/17 102/70  08/09/17 113/79  07/27/17 104/72    Wt Readings from Last 3 Encounters:  08/14/17 202 lb (91.6 kg)  08/09/17 200 lb (90.7 kg)  07/27/17 204 lb (92.5 kg)     Physical Exam  Constitutional: She is oriented to person, place, and time. She appears well-developed and well-nourished. No distress.  HENT:  Head: Normocephalic and atraumatic.  Right Ear: External ear normal.  Left Ear: External ear normal.  Nose: Nose normal.  Mouth/Throat: Oropharynx is clear and moist.  Eyes: Conjunctivae and EOM are normal. Pupils are equal, round, and reactive to light.  Neck: Normal range of motion. Neck supple. No thyromegaly present.  Cardiovascular: Normal rate, regular rhythm and normal heart sounds.  No murmur heard. Pulmonary/Chest: Effort normal and breath sounds normal. No respiratory distress. She has no wheezes. She has no rales.  Abdominal: Soft. Bowel sounds are normal. She exhibits no distension. There is no tenderness.  Musculoskeletal: Normal range of motion. She exhibits tenderness (At the right costal margin anterior axillary line). She exhibits no edema.  Lymphadenopathy:    She has no cervical adenopathy.  Neurological: She is alert and oriented to person, place, and time. She has normal reflexes.  Skin: Skin is warm and dry.  Psychiatric: She has a normal mood and affect. Her behavior is normal. Judgment and thought content normal.   Results for orders placed or performed in visit on 08/09/17  Bayer DCA Hb A1c Waived  Result Value Ref Range   Bayer DCA Hb A1c Waived 6.1 <7.0 %  Lipid panel  Result Value Ref Range   Cholesterol, Total 122 100 - 199 mg/dL   Triglycerides 129 0 - 149 mg/dL   HDL 47 >39 mg/dL   VLDL Cholesterol Cal 26 5 - 40 mg/dL   LDL Calculated 49 0 - 99 mg/dL   Chol/HDL Ratio 2.6 0.0 - 4.4 ratio  CMP14+EGFR  Result Value Ref Range   Glucose 87 65 - 99 mg/dL   BUN 7 6 - 24 mg/dL   Creatinine, Ser 0.65 0.57 -  1.00 mg/dL   GFR calc non Af Amer 107 >59 mL/min/1.73   GFR calc Af Amer 123 >59 mL/min/1.73   BUN/Creatinine Ratio 11 9 - 23   Sodium 142 134 - 144 mmol/L   Potassium 4.4 3.5 - 5.2 mmol/L   Chloride 104 96 - 106 mmol/L   CO2 25 20 - 29 mmol/L   Calcium 9.1 8.7 - 10.2 mg/dL   Total Protein 6.6 6.0 - 8.5 g/dL   Albumin 3.9 3.5 - 5.5 g/dL   Globulin, Total 2.7 1.5 - 4.5 g/dL   Albumin/Globulin Ratio 1.4 1.2 - 2.2   Bilirubin Total 0.3 0.0 - 1.2 mg/dL   Alkaline Phosphatase 58 39 - 117 IU/L   AST 18 0 - 40 IU/L   ALT 12 0 - 32 IU/L  CBC with Differential/Platelet  Result Value Ref Range   WBC 11.4 (H) 3.4 - 10.8 x10E3/uL   RBC 4.21 3.77 - 5.28 x10E6/uL   Hemoglobin 13.5 11.1 - 15.9 g/dL   Hematocrit 38.9 34.0 - 46.6 %   MCV 92 79 - 97 fL  MCH 32.1 26.6 - 33.0 pg   MCHC 34.7 31.5 - 35.7 g/dL   RDW 13.5 12.3 - 15.4 %   Platelets 295 150 - 379 x10E3/uL   Neutrophils 68 Not Estab. %   Lymphs 25 Not Estab. %   Monocytes 4 Not Estab. %   Eos 3 Not Estab. %   Basos 0 Not Estab. %   Neutrophils Absolute 7.8 (H) 1.4 - 7.0 x10E3/uL   Lymphocytes Absolute 2.8 0.7 - 3.1 x10E3/uL   Monocytes Absolute 0.4 0.1 - 0.9 x10E3/uL   EOS (ABSOLUTE) 0.3 0.0 - 0.4 x10E3/uL   Basophils Absolute 0.0 0.0 - 0.2 x10E3/uL   Immature Granulocytes 0 Not Estab. %   Immature Grans (Abs) 0.0 0.0 - 0.1 x10E3/uL      Assessment & Plan:   Evelin was seen today for follow-up.  Diagnoses and all orders for this visit:  Type 2 diabetes mellitus with diabetic polyneuropathy, without long-term current use of insulin (HCC)  DDD (degenerative disc disease), lumbar  PTSD (post-traumatic stress disorder)  Severe obesity (BMI >= 40) (HCC)  Lumbar radiculopathy  Status post placement of bone anchored hearing aid (BAHA)  Atypical pneumonia -     guaiFENesin-codeine (ROBITUSSIN AC) 100-10 MG/5ML syrup; Take 10 mLs by mouth 3 (three) times daily as needed for cough.  Other orders -     metFORMIN  (GLUCOPHAGE) 500 MG tablet; Take 1 tablet (500 mg total) by mouth daily with breakfast. -     varenicline (CHANTIX PAK) 0.5 MG X 11 & 1 MG X 42 tablet; Take as directed. -     ezetimibe (ZETIA) 10 MG tablet; Take 1 tablet (10 mg total) by mouth daily. -     amLODipine (NORVASC) 10 MG tablet; Take 1 tablet (10 mg total) by mouth daily. -     citalopram (CELEXA) 40 MG tablet; TAKE 1.5 TABLETS (60 MG TOTAL) BY MOUTH DAILY. -     tiZANidine (ZANAFLEX) 4 MG tablet; Take 1.5 tablets (6 mg total) by mouth every 6 (six) hours as needed for muscle spasms. -     omeprazole (PRILOSEC) 20 MG capsule; Take 1 capsule (20 mg total) by mouth daily.  Labs predrawn noted above shows that her cholesterol is not well controlled in spite of regular use of the atorvastatin.  She should continue taking that in spite of the elevated level and add to it is anatomy.  Other meds as noted above.     I have discontinued Karene Hearn's naproxen and benzonatate. I have also changed her metFORMIN and amLODipine. Additionally, I am having her start on varenicline and ezetimibe. Lastly, I am having her maintain her mometasone, diclofenac sodium, blood glucose meter kit and supplies, omega-3 acid ethyl esters, cyanocobalamin, atorvastatin, albuterol, albuterol, cefdinir, guaiFENesin-codeine, citalopram, tiZANidine, and omeprazole.  Allergies as of 08/14/2017      Reactions   Hydrocodone Itching, Other (See Comments)   And family of drugs   Oxycodone Itching   Tramadol Itching   Mirtazapine Other (See Comments)   xs hunger, foot tapping idiosynchronously   Darvocet [propoxyphene N-acetaminophen] Itching      Medication List        Accurate as of 08/14/17  5:50 PM. Always use your most recent med list.          albuterol (2.5 MG/3ML) 0.083% nebulizer solution Commonly known as:  PROVENTIL Take 3 mLs (2.5 mg total) by nebulization every 6 (six) hours as needed for wheezing or shortness of breath.  albuterol 108 (90  Base) MCG/ACT inhaler Commonly known as:  PROVENTIL HFA;VENTOLIN HFA Inhale 2 puffs into the lungs every 6 (six) hours as needed for wheezing or shortness of breath.   amLODipine 10 MG tablet Commonly known as:  NORVASC Take 1 tablet (10 mg total) by mouth daily.   atorvastatin 40 MG tablet Commonly known as:  LIPITOR TAKE 1 TABLET (40 MG TOTAL) BY MOUTH DAILY.   blood glucose meter kit and supplies Kit Dispense based on patient and insurance preference. Use up to four times daily as directed. (FOR ICD-9 250.00, 250.01).   cefdinir 300 MG capsule Commonly known as:  OMNICEF Take 1 capsule (300 mg total) by mouth 2 (two) times daily. 1 po BID   citalopram 40 MG tablet Commonly known as:  CELEXA TAKE 1.5 TABLETS (60 MG TOTAL) BY MOUTH DAILY.   cyanocobalamin 1000 MCG/ML injection Commonly known as:  (VITAMIN B-12) Inject 1 mL (1,000 mcg total) into the muscle every 30 (thirty) days.   diclofenac sodium 1 % Gel Commonly known as:  VOLTAREN Apply 4 g topically 4 (four) times daily.   ezetimibe 10 MG tablet Commonly known as:  ZETIA Take 1 tablet (10 mg total) by mouth daily.   guaiFENesin-codeine 100-10 MG/5ML syrup Commonly known as:  ROBITUSSIN AC Take 10 mLs by mouth 3 (three) times daily as needed for cough.   metFORMIN 500 MG tablet Commonly known as:  GLUCOPHAGE Take 1 tablet (500 mg total) by mouth daily with breakfast.   mometasone 0.1 % lotion Commonly known as:  ELOCON APPLY TOPICALLY DAILY.   omega-3 acid ethyl esters 1 g capsule Commonly known as:  LOVAZA TAKE 2 CAPSULE 2 TIMES A DAY   omeprazole 20 MG capsule Commonly known as:  PRILOSEC Take 1 capsule (20 mg total) by mouth daily.   tiZANidine 4 MG tablet Commonly known as:  ZANAFLEX Take 1.5 tablets (6 mg total) by mouth every 6 (six) hours as needed for muscle spasms.   varenicline 0.5 MG X 11 & 1 MG X 42 tablet Commonly known as:  CHANTIX PAK Take as directed.        Follow-up: Return in  about 3 months (around 11/12/2017).  Claretta Fraise, M.D.

## 2017-08-15 ENCOUNTER — Encounter: Payer: Self-pay | Admitting: Family Medicine

## 2017-08-16 ENCOUNTER — Other Ambulatory Visit: Payer: Self-pay | Admitting: Family Medicine

## 2017-08-16 MED ORDER — FLUCONAZOLE 150 MG PO TABS: 150.0000 mg | ORAL_TABLET | Freq: Once | ORAL | 0 refills | Status: AC

## 2017-08-17 ENCOUNTER — Telehealth (INDEPENDENT_AMBULATORY_CARE_PROVIDER_SITE_OTHER): Payer: Self-pay | Admitting: Orthopaedic Surgery

## 2017-08-17 NOTE — Telephone Encounter (Signed)
Patient called asked for a call back to schedule surgery with Dr Lorin Mercy. The number to contact patient is [phone]

## 2017-08-18 ENCOUNTER — Encounter: Payer: Self-pay | Admitting: Family Medicine

## 2017-08-18 NOTE — Telephone Encounter (Signed)
Aimee [name] would like to set up surgery.  I believe it is for CTS.  I do not have a blue sheet, she thought that Dr. Lorin Mercy was going to review her nerve conduction studies and schedule. I advised I could get her on the schedule before the end of the year without a problem.  Can you please review and fill out the surgery sheet?  Thank you!

## 2017-08-18 NOTE — Telephone Encounter (Signed)
I called. Wants left done first since it bothers her more. Thanks blue sheet done

## 2017-08-22 NOTE — Telephone Encounter (Signed)
Patient got a call from Dr. Lorin Mercy saying she needs to go ahead and schedule. She was told there was openings on 12/31 and wants to see if she can schedule for then. 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 Please give patient a call back to schedule [phone]

## 2017-08-24 ENCOUNTER — Other Ambulatory Visit: Payer: Self-pay | Admitting: Physician Assistant

## 2017-08-24 DIAGNOSIS — J189 Pneumonia, unspecified organism: Secondary | ICD-10-CM

## 2017-08-28 DIAGNOSIS — G5601 Carpal tunnel syndrome, right upper limb: Secondary | ICD-10-CM | POA: Diagnosis not present

## 2017-08-28 DIAGNOSIS — G5602 Carpal tunnel syndrome, left upper limb: Secondary | ICD-10-CM | POA: Diagnosis not present

## 2017-08-30 ENCOUNTER — Telehealth: Payer: Self-pay | Admitting: *Deleted

## 2017-08-30 MED ORDER — CITALOPRAM HYDROBROMIDE 40 MG PO TABS: ORAL_TABLET | ORAL | 1 refills | Status: DC

## 2017-08-30 NOTE — Telephone Encounter (Signed)
Fax received Rx written for Citalopram HBR 40 mg 1.5 tab qd Fax requesting alternative max dose 1 tab daily Please advise [facility]

## 2017-09-06 ENCOUNTER — Ambulatory Visit (INDEPENDENT_AMBULATORY_CARE_PROVIDER_SITE_OTHER): Payer: BLUE CROSS/BLUE SHIELD | Admitting: Surgery

## 2017-09-06 ENCOUNTER — Encounter (INDEPENDENT_AMBULATORY_CARE_PROVIDER_SITE_OTHER): Payer: Self-pay | Admitting: Surgery

## 2017-09-06 VITALS — BP 99/75 | HR 90

## 2017-09-06 DIAGNOSIS — Z9889 Other specified postprocedural states: Secondary | ICD-10-CM

## 2017-09-06 NOTE — Progress Notes (Signed)
   Post-Op Visit Note   Patient: Aimee [name]           Date of Birth: February 17, 1971           MRN: [id number] Visit Date: 09/06/2017 PCP: Claretta Fraise, MD   Assessment & Plan:  Chief Complaint:  Chief Complaint  Patient presents with  . Left Wrist - Routine Post Op  Patient returns for recheck. Nine-day status post left carpal tunnel release. Doing well. 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 States that she now has "left trigger finger". States that she's been having some issue with the small finger since his surgery. Visit Diagnoses:  1. Status post carpal tunnel release     Plan: Patient follow up with me in about a week for recheck and possible suture removal. Advised no aggressive activity with her hand. Can begin gentle range of motion of her fingers and wrist. Wrist splint given.  Follow-Up Instructions: Return in about 7 days (around 09/13/2017) for [facility] for wound check and suture removal.   Orders:  No orders of the defined types were placed in this encounter.  No orders of the defined types were placed in this encounter.   Imaging: No results found.  PMFS History: Patient Active Problem List   Diagnosis Date Noted  . Status post placement of bone anchored hearing aid (BAHA) 08/14/2017  . Fibromyalgia 11/29/2016  . Lumbar radiculopathy 11/21/2016  . Essential hypertension 04/22/2016  . Severe obesity (BMI >= 40) ([place]) 10/08/2015  . PTSD (post-traumatic stress disorder) 09/10/2015  . Trapezius strain 08/19/2015  . Depression 08/19/2015  . Hypertriglyceridemia 11/12/2014  . Peripheral neuropathy 11/12/2014  . DDD (degenerative disc disease), lumbar 11/12/2014  . Diabetes ([place]) 03/05/2013  . Smoking 03/05/2013   Past Medical History:  Diagnosis Date  . Depression   . Diabetes mellitus   . Hypertriglyceridemia   . PTSD (post-traumatic stress disorder)     Family History  Problem Relation Age of Onset  . GI problems Mother   . Diabetes Father   .  Cancer Maternal Aunt   . Breast cancer Maternal Aunt   . Cancer Maternal Grandmother   . Heart attack Maternal Grandfather   . Heart attack Paternal Grandmother   . Heart attack Paternal Grandfather   . Heart attack Paternal Uncle     Past Surgical History:  Procedure Laterality Date  . ABDOMINAL HYSTERECTOMY  2010  . ABDOMINOPLASTY  2006  . BREAST EXCISIONAL BIOPSY     right  . [place]   x2  . INNER EAR SURGERY     12 surgeries on R ear starting in 1984; implant 06/2011  . SHOULDER ARTHROSCOPY Left 10/2015  . THORACIC DISC SURGERY  2011, 2012   x2   Social History   Occupational History    Comment: [employer]  Tobacco Use  . Smoking status: Current Every Day Smoker    Packs/day: 1.00    Years: 23.00    Pack years: 23.00    Types: Cigarettes  . Smokeless tobacco: Never Used  Substance and Sexual Activity  . Alcohol use: No    Alcohol/week: 0.0 oz  . Drug use: No  . Sexual activity: Not on file   Exam Wound looks good. Sutures intact. Number swelling. Patient can cause triggering of the left small finger With flexion and extension. neurovascular intact.

## 2017-09-07 ENCOUNTER — Other Ambulatory Visit: Payer: Self-pay | Admitting: *Deleted

## 2017-09-07 MED ORDER — OMEGA-3-ACID ETHYL ESTERS 1 G PO CAPS: ORAL_CAPSULE | ORAL | 3 refills | Status: DC

## 2017-09-07 MED ORDER — CITALOPRAM HYDROBROMIDE 40 MG PO TABS: ORAL_TABLET | ORAL | 1 refills | Status: DC

## 2017-09-07 MED ORDER — METFORMIN HCL 500 MG PO TABS: 500.0000 mg | ORAL_TABLET | Freq: Every day | ORAL | 1 refills | Status: DC

## 2017-09-07 MED ORDER — EZETIMIBE 10 MG PO TABS: 10.0000 mg | ORAL_TABLET | Freq: Every day | ORAL | 1 refills | Status: DC

## 2017-09-07 MED ORDER — AMLODIPINE BESYLATE 10 MG PO TABS: 10.0000 mg | ORAL_TABLET | Freq: Every day | ORAL | 1 refills | Status: DC

## 2017-09-07 MED ORDER — ATORVASTATIN CALCIUM 40 MG PO TABS: 40.0000 mg | ORAL_TABLET | Freq: Every day | ORAL | 1 refills | Status: DC

## 2017-09-07 MED ORDER — OMEPRAZOLE 20 MG PO CPDR: 20.0000 mg | DELAYED_RELEASE_CAPSULE | Freq: Every day | ORAL | 1 refills | Status: DC

## 2017-09-12 HISTORY — PX: SPINAL CORD STIMULATOR IMPLANT: SHX2422

## 2017-09-13 ENCOUNTER — Encounter: Payer: Self-pay | Admitting: Family Medicine

## 2017-09-13 ENCOUNTER — Ambulatory Visit (INDEPENDENT_AMBULATORY_CARE_PROVIDER_SITE_OTHER): Payer: BLUE CROSS/BLUE SHIELD | Admitting: Surgery

## 2017-09-13 ENCOUNTER — Telehealth: Payer: Self-pay | Admitting: *Deleted

## 2017-09-13 DIAGNOSIS — Z9889 Other specified postprocedural states: Secondary | ICD-10-CM

## 2017-09-13 DIAGNOSIS — M67449 Ganglion, unspecified hand: Secondary | ICD-10-CM

## 2017-09-13 NOTE — Telephone Encounter (Signed)
[email or web address]

## 2017-09-13 NOTE — Progress Notes (Signed)
Post-Op Visit Note   Patient: Aimee [name]           Date of Birth: 02/27/1971           MRN: [id number] Visit Date: 09/13/2017 PCP: Claretta Fraise, MD   Assessment & Plan:  Chief Complaint: No chief complaint on file.  Visit Diagnoses:  1. Status post carpal tunnel release   2. Ganglion cyst of finger   Left thumb  Plan: Patient will follow up in 4 weeks for recheck. We'll continue to monitor the numbness and tingling that she is describing the ulnar aspect of her left hand. This may be related to chronic cervical spine issues and possible left ulnar neuropathy. I was unable to find a copy of her previous NCV/EMG study. Regards to the thumb tingling cyst if this continues to be a problem in the future Dr. Lorin Mercy advised patient that this could be removed at that time she has right carpal tunnel release.  Follow-Up Instructions: Return in about 4 weeks (around 10/11/2017) for [facility] recheck.   Orders:  No orders of the defined types were placed in this encounter.  No orders of the defined types were placed in this encounter.   Imaging: No results found.  PMFS History: Patient Active Problem List   Diagnosis Date Noted  . Ganglion cyst of finger 09/13/2017  . Status post placement of bone anchored hearing aid (BAHA) 08/14/2017  . Fibromyalgia 11/29/2016  . Lumbar radiculopathy 11/21/2016  . Essential hypertension 04/22/2016  . Severe obesity (BMI >= 40) ([place]) 10/08/2015  . PTSD (post-traumatic stress disorder) 09/10/2015  . Trapezius strain 08/19/2015  . Depression 08/19/2015  . Hypertriglyceridemia 11/12/2014  . Peripheral neuropathy 11/12/2014  . DDD (degenerative disc disease), lumbar 11/12/2014  . Diabetes ([place]) 03/05/2013  . Smoking 03/05/2013   Past Medical History:  Diagnosis Date  . Depression   . Diabetes mellitus   . Hypertriglyceridemia   . PTSD (post-traumatic stress disorder)     Family History  Problem Relation Age of Onset  . GI problems Mother    . Diabetes Father   . Cancer Maternal Aunt   . Breast cancer Maternal Aunt   . Cancer Maternal Grandmother   . Heart attack Maternal Grandfather   . Heart attack Paternal Grandmother   . Heart attack Paternal Grandfather   . Heart attack Paternal Uncle     Past Surgical History:  Procedure Laterality Date  . ABDOMINAL HYSTERECTOMY  2010  . ABDOMINOPLASTY  2006  . BREAST EXCISIONAL BIOPSY     right  . [place]   x2  . INNER EAR SURGERY     12 surgeries on R ear starting in 1984; implant 06/2011  . SHOULDER ARTHROSCOPY Left 10/2015  . THORACIC DISC SURGERY  2011, 2012   x2   Social History   Occupational History    Comment: [employer]  Tobacco Use  . Smoking status: Current Every Day Smoker    Packs/day: 1.00    Years: 23.00    Pack years: 23.00    Types: Cigarettes  . Smokeless tobacco: Never Used  Substance and Sexual Activity  . Alcohol use: No    Alcohol/week: 0.0 oz  . Drug use: No  . Sexual activity: Not on file   Exam Left hand sutures removed. No drainage signs of infection. She does have a small cystic mass around the thumb MCP joint. This is tender and somewhat mobile. Mild positive Tinel's over the left cubital  tunnel.

## 2017-09-13 NOTE — Telephone Encounter (Signed)
Omega 3 not covered Alternatives: Gemfibrozil, fenofibrate acid, or fenofibrate  Please recommend and send to [facility]

## 2017-09-13 NOTE — Telephone Encounter (Signed)
Have her use OTC krill oil 300 mg, 2 BID

## 2017-09-20 ENCOUNTER — Encounter (INDEPENDENT_AMBULATORY_CARE_PROVIDER_SITE_OTHER): Payer: Self-pay | Admitting: Surgery

## 2017-09-21 ENCOUNTER — Telehealth: Payer: Self-pay | Admitting: *Deleted

## 2017-09-21 NOTE — Telephone Encounter (Signed)
Fax received regarding drug interaction review Pt taking Celexa at dose greater than 20 mg daily with a 2C19 inhibitor. Dose of Citalopram should be limited to 20 mg with a 2C19 inhibitor  Per Dr [name] - discontinue interacting 2C19 inhibitor Responding fax was faxed back to [facility]

## 2017-09-25 NOTE — Telephone Encounter (Signed)
Phone call taken care of in different encounter.  This encounter will now be closed

## 2017-10-02 ENCOUNTER — Other Ambulatory Visit: Payer: Self-pay | Admitting: Family Medicine

## 2017-10-02 DIAGNOSIS — Z139 Encounter for screening, unspecified: Secondary | ICD-10-CM

## 2017-10-12 ENCOUNTER — Ambulatory Visit (INDEPENDENT_AMBULATORY_CARE_PROVIDER_SITE_OTHER): Payer: Self-pay | Admitting: Surgery

## 2017-10-12 ENCOUNTER — Encounter (INDEPENDENT_AMBULATORY_CARE_PROVIDER_SITE_OTHER): Payer: Self-pay | Admitting: Surgery

## 2017-10-12 ENCOUNTER — Ambulatory Visit (INDEPENDENT_AMBULATORY_CARE_PROVIDER_SITE_OTHER): Payer: BLUE CROSS/BLUE SHIELD | Admitting: Surgery

## 2017-10-12 DIAGNOSIS — Z9889 Other specified postprocedural states: Secondary | ICD-10-CM

## 2017-10-12 NOTE — Progress Notes (Signed)
   Post-Op Visit Note   Patient: Aimee [name]           Date of Birth: 1971/03/16           MRN: [id number] Visit Date: 10/12/2017 PCP: Claretta Fraise, MD   Assessment & Plan:  Chief Complaint:  Chief Complaint  Patient presents with  . Left Hand - Follow-up   Visit Diagnoses:  1. Status post carpal tunnel release     Plan: Patient will follow-up in 3 weeks for recheck with Dr. Lorin Mercy to make sure that her hand symptoms are continuing to improve postop.  Continue elevating the hand as much as possible to decrease any swelling.  Continue to work on range of motion of wrist and fingers.  Follow-Up Instructions: Return in about 3 weeks (around 11/02/2017).   Orders:  No orders of the defined types were placed in this encounter.  No orders of the defined types were placed in this encounter.   Imaging: No results found.  PMFS History: Patient Active Problem List   Diagnosis Date Noted  . Ganglion cyst of finger 09/13/2017  . Status post placement of bone anchored hearing aid (BAHA) 08/14/2017  . Fibromyalgia 11/29/2016  . Lumbar radiculopathy 11/21/2016  . Essential hypertension 04/22/2016  . Severe obesity (BMI >= 40) ([place]) 10/08/2015  . PTSD (post-traumatic stress disorder) 09/10/2015  . Trapezius strain 08/19/2015  . Depression 08/19/2015  . Hypertriglyceridemia 11/12/2014  . Peripheral neuropathy 11/12/2014  . DDD (degenerative disc disease), lumbar 11/12/2014  . Diabetes ([place]) 03/05/2013  . Smoking 03/05/2013   Past Medical History:  Diagnosis Date  . Depression   . Diabetes mellitus   . Hypertriglyceridemia   . PTSD (post-traumatic stress disorder)     Family History  Problem Relation Age of Onset  . GI problems Mother   . Diabetes Father   . Cancer Maternal Aunt   . Breast cancer Maternal Aunt   . Cancer Maternal Grandmother   . Heart attack Maternal Grandfather   . Heart attack Paternal Grandmother   . Heart attack Paternal Grandfather   . Heart  attack Paternal Uncle     Past Surgical History:  Procedure Laterality Date  . ABDOMINAL HYSTERECTOMY  2010  . ABDOMINOPLASTY  2006  . BREAST EXCISIONAL BIOPSY     right  . [place]   x2  . INNER EAR SURGERY     12 surgeries on R ear starting in 1984; implant 06/2011  . SHOULDER ARTHROSCOPY Left 10/2015  . THORACIC DISC SURGERY  2011, 2012   x2   Social History   Occupational History    Comment: [employer]  Tobacco Use  . Smoking status: Current Every Day Smoker    Packs/day: 1.00    Years: 23.00    Pack years: 23.00    Types: Cigarettes  . Smokeless tobacco: Never Used  Substance and Sexual Activity  . Alcohol use: No    Alcohol/week: 0.0 oz  . Drug use: No  . Sexual activity: Not on file   Exam Right wrist she does have good range of motion.  Surgical incision is well-healed.  Some palmar and swelling.  No signs of infection.  Area around incision continues to be tender to palpation.  Neurovascular intact.

## 2017-10-22 DIAGNOSIS — F3113 Bipolar disorder, current episode manic without psychotic features, severe: Secondary | ICD-10-CM | POA: Diagnosis not present

## 2017-11-02 ENCOUNTER — Ambulatory Visit (INDEPENDENT_AMBULATORY_CARE_PROVIDER_SITE_OTHER): Payer: Self-pay | Admitting: Surgery

## 2017-11-03 ENCOUNTER — Ambulatory Visit: Payer: BLUE CROSS/BLUE SHIELD | Admitting: Family Medicine

## 2017-11-03 VITALS — BP 112/75 | HR 88 | Temp 98.0°F | Wt 205.0 lb

## 2017-11-03 DIAGNOSIS — J020 Streptococcal pharyngitis: Secondary | ICD-10-CM

## 2017-11-03 LAB — RAPID STREP SCREEN (MED CTR MEBANE ONLY): Strep Gp A Ag, IA W/Reflex: POSITIVE — AB

## 2017-11-03 MED ORDER — FLUCONAZOLE 150 MG PO TABS: 150.0000 mg | ORAL_TABLET | Freq: Once | ORAL | 0 refills | Status: DC

## 2017-11-03 MED ORDER — AMOXICILLIN 500 MG PO CAPS: 500.0000 mg | ORAL_CAPSULE | Freq: Two times a day (BID) | ORAL | 0 refills | Status: AC

## 2017-11-03 NOTE — Progress Notes (Signed)
Subjective: CC: ?strep PCP: Claretta Fraise, MD WEX:[id number] Aimee [name] is a 47 y.o. female presenting to clinic today for:  1. Strep Patient notes that she has been having about an 8-day course of sore throat, sinus congestion, dry cough, nausea and generalized ill feeling.  She does not remember having any fevers per se but does note feeling warm.  She feels like symptoms seem to get worse at nighttime.  She has been using a humidifier, TheraFlu and tea.  She also had a couple of doses of Keflex left over from a previous illness that she used.  She notes that symptoms seem to be worsening and not improving.  She has grandchildren and may have come in contact with an illness from them but is unsure.  She would like to be tested for strep today.   ROS: Per HPI  Allergies  Allergen Reactions  . Hydrocodone Itching and Other (See Comments)    And family of drugs   . Oxycodone Itching  . Tramadol Itching  . Mirtazapine Other (See Comments)    xs hunger, foot tapping idiosynchronously  . Darvocet [Propoxyphene N-Acetaminophen] Itching   Past Medical History:  Diagnosis Date  . Depression   . Diabetes mellitus   . Hypertriglyceridemia   . PTSD (post-traumatic stress disorder)     Current Outpatient Medications:  .  albuterol (PROVENTIL HFA;VENTOLIN HFA) 108 (90 Base) MCG/ACT inhaler, Inhale 2 puffs into the lungs every 6 (six) hours as needed for wheezing or shortness of breath., Disp: 1 Inhaler, Rfl: 5 .  albuterol (PROVENTIL) (2.5 MG/3ML) 0.083% nebulizer solution, Take 3 mLs (2.5 mg total) by nebulization every 6 (six) hours as needed for wheezing or shortness of breath., Disp: 150 mL, Rfl: 1 .  amLODipine (NORVASC) 10 MG tablet, Take 1 tablet (10 mg total) by mouth daily., Disp: 90 tablet, Rfl: 1 .  atorvastatin (LIPITOR) 40 MG tablet, Take 1 tablet (40 mg total) by mouth daily., Disp: 90 tablet, Rfl: 1 .  blood glucose meter kit and supplies KIT, Dispense based on patient and  insurance preference. Use up to four times daily as directed. (FOR ICD-9 250.00, 250.01)., Disp: 1 each, Rfl: 0 .  cefdinir (OMNICEF) 300 MG capsule, TAKE 1 CAPSULE (300 MG TOTAL) BY MOUTH 2 (TWO) TIMES DAILY, Disp: 20 capsule, Rfl: 0 .  citalopram (CELEXA) 40 MG tablet, TAKE 1 TABLETS (40 MG TOTAL) BY MOUTH DAILY., Disp: 90 tablet, Rfl: 1 .  cyanocobalamin (,VITAMIN B-12,) 1000 MCG/ML injection, Inject 1 mL (1,000 mcg total) into the muscle every 30 (thirty) days., Disp: 10 mL, Rfl: 0 .  diclofenac sodium (VOLTAREN) 1 % GEL, Apply 4 g topically 4 (four) times daily. (Patient taking differently: Apply 4 g topically 4 (four) times daily as needed (neck and back pain). ), Disp: 100 Tube, Rfl: 0 .  ezetimibe (ZETIA) 10 MG tablet, Take 1 tablet (10 mg total) by mouth daily., Disp: 90 tablet, Rfl: 1 .  gabapentin (NEURONTIN) 600 MG tablet, TAKE 1 TABLET EVERY MORNING , 2 TABS IN THE AFTERNOON, AND TAKE 3 TABLETS AT BEDTIME, Disp: , Rfl: 0 .  guaiFENesin-codeine (ROBITUSSIN AC) 100-10 MG/5ML syrup, Take 10 mLs by mouth 3 (three) times daily as needed for cough., Disp: 240 mL, Rfl: 0 .  metFORMIN (GLUCOPHAGE) 500 MG tablet, Take 1 tablet (500 mg total) by mouth daily with breakfast., Disp: 90 tablet, Rfl: 1 .  mometasone (ELOCON) 0.1 % lotion, APPLY TOPICALLY DAILY. (Patient taking differently: Apply 1 application topically daily  as needed (for skin irritation (ears)). ), Disp: 180 mL, Rfl: 3 .  omega-3 acid ethyl esters (LOVAZA) 1 g capsule, TAKE 2 CAPSULE 2 TIMES A DAY, Disp: 120 capsule, Rfl: 3 .  omeprazole (PRILOSEC) 20 MG capsule, Take 1 capsule (20 mg total) by mouth daily., Disp: 90 capsule, Rfl: 1 .  tiZANidine (ZANAFLEX) 4 MG tablet, Take 1.5 tablets (6 mg total) by mouth every 6 (six) hours as needed for muscle spasms., Disp: 180 tablet, Rfl: 2 .  varenicline (CHANTIX PAK) 0.5 MG X 11 & 1 MG X 42 tablet, Take as directed., Disp: 53 tablet, Rfl: 0 Social History   Socioeconomic History  . Marital  status: Married    Spouse name: Abe People  . Number of children: 3  . Years of education: 55  . Highest education level: Not on file  Social Needs  . Financial resource strain: Not on file  . Food insecurity - worry: Not on file  . Food insecurity - inability: Not on file  . Transportation needs - medical: Not on file  . Transportation needs - non-medical: Not on file  Occupational History    Comment: [employer]  Tobacco Use  . Smoking status: Current Every Day Smoker    Packs/day: 1.00    Years: 23.00    Pack years: 23.00    Types: Cigarettes  . Smokeless tobacco: Never Used  Substance and Sexual Activity  . Alcohol use: No    Alcohol/week: 0.0 oz  . Drug use: No  . Sexual activity: Not on file  Other Topics Concern  . Not on file  Social History Narrative   Lives with spouse at home   Caffeine use- coffee, 1-2 cups daily   Family History  Problem Relation Age of Onset  . GI problems Mother   . Diabetes Father   . Cancer Maternal Aunt   . Breast cancer Maternal Aunt   . Cancer Maternal Grandmother   . Heart attack Maternal Grandfather   . Heart attack Paternal Grandmother   . Heart attack Paternal Grandfather   . Heart attack Paternal Uncle     Objective: Office vital signs reviewed. BP 112/75   Pulse 88   Temp 98 F (36.7 C)   Wt 205 lb (93 kg)   BMI 31.17 kg/m   Physical Examination:  General: Awake, alert, well nourished, nontoxic, No acute distress HEENT: Normal    Neck: No masses palpated. No lymphadenopathy    Ears: Right external ear with additional perforation within the helix.  Bilateral Tympanic membranes intact, dulled light reflex, no erythema, no bulging. R TM with scarring.     Eyes: PERRLA, extraocular membranes intact, sclera white    Nose: nasal turbinates moist, clear nasal discharge    Throat: moist mucus membranes, moderate oropharyngeal erythema, grade 3 tonsils with no tonsillar exudate.  Airway is patent Cardio: regular rate and  rhythm, S1S2 heard, no murmurs appreciated Pulm: clear to auscultation bilaterally, no wheezes, rhonchi or rales; normal work of breathing on room air   Assessment/ Plan: 47 y.o. female   1. Strep pharyngitis Patient is afebrile and nontoxic-appearing during today's exam.  Her physical exam was remarkable for grade 3 tonsils and moderate oropharyngeal erythema.  Rapid strep was positive.  She was prescribed amoxicillin 500 mg p.o. twice daily for the next 10 days.  Diflucan tablet also sent in, as she often gets yeast infections with antibiotic use.  A work note was provided excusing the next 24 hours.  Home care instructions reviewed and handout was provided.  Return precautions reviewed with patient.  She was good understanding will follow-up as needed. - Rapid Strep Screen (Not at [facility])   Orders Placed This Encounter  Procedures  . Rapid Strep Screen (Not at [facility])   Meds ordered this encounter  Medications  . amoxicillin (AMOXIL) 500 MG capsule    Sig: Take 1 capsule (500 mg total) by mouth 2 (two) times daily for 10 days.    Dispense:  20 capsule    Refill:  0  . fluconazole (DIFLUCAN) 150 MG tablet    Sig: Take 1 tablet (150 mg total) by mouth once for 1 dose. May repeat in 3 days    Dispense:  2 tablet    Refill:  0     Brilee Port Windell Moulding, DO [place] [phone]

## 2017-11-03 NOTE — Patient Instructions (Signed)

## 2017-11-04 ENCOUNTER — Other Ambulatory Visit: Payer: Self-pay | Admitting: Physician Assistant

## 2017-11-04 MED ORDER — PREDNISONE 10 MG (21) PO TBPK: ORAL_TABLET | ORAL | 0 refills | Status: DC

## 2017-11-07 ENCOUNTER — Encounter (INDEPENDENT_AMBULATORY_CARE_PROVIDER_SITE_OTHER): Payer: Self-pay | Admitting: Orthopaedic Surgery

## 2017-11-07 ENCOUNTER — Ambulatory Visit (INDEPENDENT_AMBULATORY_CARE_PROVIDER_SITE_OTHER): Payer: BLUE CROSS/BLUE SHIELD | Admitting: Orthopaedic Surgery

## 2017-11-07 DIAGNOSIS — G5602 Carpal tunnel syndrome, left upper limb: Secondary | ICD-10-CM

## 2017-11-07 NOTE — Progress Notes (Signed)
   Post-Op Visit Note   Patient: Aimee [name]           Date of Birth: Oct 01, 1970           MRN: [id number] Visit Date: 11/07/2017 PCP: Claretta Fraise, MD   Assessment & Plan: Patient's postop left carpal tunnel release 08/28/2017.  She still has a lot of sensitivity at the incision.  No thenar atrophy.  Chief Complaint: Post left carpal tunnel release with some persistent scar tenderness.  She has right carpal tunnel and is waiting for her left hand to do well for she is ready to proceed with the right carpal tunnel Chief Complaint  Patient presents with  . Left Wrist - Routine Post Op   Visit Diagnoses: Left carpal tunnel syndrome  Plan: She can use the splint on her left hand intermittently.  She has get through tax season before she can get her right hand done.  She will call when she is ready to proceed with right carpal tunnel release.  Follow-Up Instructions: Return if symptoms worsen or fail to improve.   Orders:  No orders of the defined types were placed in this encounter.  No orders of the defined types were placed in this encounter.   Imaging: No results found.  PMFS History: Patient Active Problem List   Diagnosis Date Noted  . Ganglion cyst of finger 09/13/2017  . Status post placement of bone anchored hearing aid (BAHA) 08/14/2017  . Fibromyalgia 11/29/2016  . Lumbar radiculopathy 11/21/2016  . Essential hypertension 04/22/2016  . Severe obesity (BMI >= 40) ([place]) 10/08/2015  . PTSD (post-traumatic stress disorder) 09/10/2015  . Trapezius strain 08/19/2015  . Depression 08/19/2015  . Hypertriglyceridemia 11/12/2014  . Peripheral neuropathy 11/12/2014  . DDD (degenerative disc disease), lumbar 11/12/2014  . Diabetes ([place]) 03/05/2013  . Smoking 03/05/2013   Past Medical History:  Diagnosis Date  . Depression   . Diabetes mellitus   . Hypertriglyceridemia   . PTSD (post-traumatic stress disorder)     Family History  Problem Relation Age of Onset    . GI problems Mother   . Diabetes Father   . Cancer Maternal Aunt   . Breast cancer Maternal Aunt   . Cancer Maternal Grandmother   . Heart attack Maternal Grandfather   . Heart attack Paternal Grandmother   . Heart attack Paternal Grandfather   . Heart attack Paternal Uncle     Past Surgical History:  Procedure Laterality Date  . ABDOMINAL HYSTERECTOMY  2010  . ABDOMINOPLASTY  2006  . BREAST EXCISIONAL BIOPSY     right  . [place]   x2  . INNER EAR SURGERY     12 surgeries on R ear starting in 1984; implant 06/2011  . SHOULDER ARTHROSCOPY Left 10/2015  . THORACIC DISC SURGERY  2011, 2012   x2   Social History   Occupational History    Comment: [employer]  Tobacco Use  . Smoking status: Current Every Day Smoker    Packs/day: 1.00    Years: 23.00    Pack years: 23.00    Types: Cigarettes  . Smokeless tobacco: Never Used  Substance and Sexual Activity  . Alcohol use: No    Alcohol/week: 0.0 oz  . Drug use: No  . Sexual activity: Not on file

## 2017-11-13 ENCOUNTER — Ambulatory Visit: Payer: Self-pay | Admitting: Family Medicine

## 2017-11-13 ENCOUNTER — Telehealth: Payer: Self-pay | Admitting: Family Medicine

## 2017-11-13 NOTE — Telephone Encounter (Signed)
Pt. Needs to follow up in the office

## 2017-11-14 ENCOUNTER — Ambulatory Visit (INDEPENDENT_AMBULATORY_CARE_PROVIDER_SITE_OTHER): Payer: BLUE CROSS/BLUE SHIELD

## 2017-11-14 ENCOUNTER — Encounter: Payer: Self-pay | Admitting: Family Medicine

## 2017-11-14 ENCOUNTER — Ambulatory Visit: Payer: BLUE CROSS/BLUE SHIELD | Admitting: Family Medicine

## 2017-11-14 VITALS — BP 117/81 | HR 83 | Temp 97.6°F | Ht 68.0 in | Wt 204.0 lb

## 2017-11-14 DIAGNOSIS — M129 Arthropathy, unspecified: Secondary | ICD-10-CM

## 2017-11-14 DIAGNOSIS — J029 Acute pharyngitis, unspecified: Secondary | ICD-10-CM

## 2017-11-14 DIAGNOSIS — R5382 Chronic fatigue, unspecified: Secondary | ICD-10-CM

## 2017-11-14 DIAGNOSIS — M25511 Pain in right shoulder: Secondary | ICD-10-CM

## 2017-11-14 LAB — RAPID STREP SCREEN (MED CTR MEBANE ONLY): Strep Gp A Ag, IA W/Reflex: NEGATIVE

## 2017-11-14 LAB — CULTURE, GROUP A STREP

## 2017-11-14 MED ORDER — BETAMETHASONE SOD PHOS & ACET 6 (3-3) MG/ML IJ SUSP
6.0000 mg | Freq: Once | INTRAMUSCULAR | Status: AC
  Administered 2017-11-14: 6 mg via INTRAMUSCULAR

## 2017-11-14 NOTE — Telephone Encounter (Signed)
Patient has an appt with Dr. [name] today 11/14/17. Patient aware to keep appt

## 2017-11-14 NOTE — Progress Notes (Signed)
Subjective:  Patient ID: Aimee [name], female    DOB: 1970/12/18  Age: 48 y.o. MRN: [id number]  CC: Shoulder Pain (pt here today c/o right shoulder pain since 2 days ago and can't lift it and also  feels like she still has strep throat)   HPI Aimee [name] presents for increasing pain in the right shoulder.  For the last several days the patient has been unable to abduct or flex the elbow/shoulder. Pain is moderately severe. However she has joint pain widespread as well that is chronic. Pt. Reports severe fatigue. Wanting to sleep 12+ hours a day.onset 2 weeks ago. She recently had strep. Just finished treatment antibiotic. Concerned it didn't clear since ST continues.   Depression screen [facility] 2/9 11/14/2017 08/09/2017 05/29/2017  Decreased Interest 2 0 1  Down, Depressed, Hopeless 2 0 0  PHQ - 2 Score 4 0 1  Altered sleeping 3 - -  Tired, decreased energy 3 - -  Change in appetite 3 - -  Feeling bad or failure about yourself  2 - -  Trouble concentrating 3 - -  Moving slowly or fidgety/restless 3 - -  Suicidal thoughts 0 - -  PHQ-9 Score 21 - -  Difficult doing work/chores - - -  Some recent data might be hidden    History Aimee [name] has a past medical history of Depression, Diabetes mellitus, Hypertriglyceridemia, and PTSD (post-traumatic stress disorder).   She has a past surgical history that includes Abdominoplasty (2006); Inner ear surgery; Thoracic disc surgery (2011, 2012); Abdominal hysterectomy (2010); Cesarean section (1991, 1995); Shoulder arthroscopy (Left, 10/2015); and Breast excisional biopsy.   Her family history includes Breast cancer in her maternal aunt; Cancer in her maternal aunt and maternal grandmother; Diabetes in her father; GI problems in her mother; Heart attack in her maternal grandfather, paternal grandfather, paternal grandmother, and paternal uncle.She reports that she has been smoking cigarettes.  She has a 23.00 pack-year smoking history. she has never  used smokeless tobacco. She reports that she does not drink alcohol or use drugs.    ROS Review of Systems  Constitutional: Positive for diaphoresis and fatigue. Negative for activity change, appetite change and fever.  HENT: Positive for postnasal drip and sore throat. Negative for congestion and ear pain.   Eyes: Negative for visual disturbance.  Respiratory: Positive for cough. Negative for shortness of breath.   Cardiovascular: Negative for chest pain and palpitations.  Gastrointestinal: Positive for abdominal pain. Negative for diarrhea and nausea.  Genitourinary: Negative for dysuria.  Musculoskeletal: Positive for arthralgias and myalgias.  Psychiatric/Behavioral: Negative.     Objective:  BP 117/81   Pulse 83   Temp 97.6 F (36.4 C) (Oral)   Ht _0  (1.727 m)   Wt 204 lb (92.5 kg)   BMI 31.02 kg/m   BP Readings from Last 3 Encounters:  11/14/17 117/81  11/03/17 112/75  09/06/17 99/75    Wt Readings from Last 3 Encounters:  11/14/17 204 lb (92.5 kg)  11/03/17 205 lb (93 kg)  08/14/17 202 lb (91.6 kg)     Physical Exam  Constitutional: She is oriented to person, place, and time. She appears well-developed and well-nourished. No distress.  HENT:  Head: Normocephalic and atraumatic.  Eyes: Conjunctivae are normal. Pupils are equal, round, and reactive to light.  Neck: Normal range of motion. Neck supple. No thyromegaly present.  Cardiovascular: Normal rate, regular rhythm and normal heart sounds.  No murmur heard. Pulmonary/Chest: Effort normal and breath sounds normal.  No respiratory distress. She has no wheezes. She has no rales.  Abdominal: Soft. Bowel sounds are normal. She exhibits no distension. There is no tenderness.  Musculoskeletal: Normal range of motion.  Lymphadenopathy:    She has no cervical adenopathy.  Neurological: She is alert and oriented to person, place, and time.  Skin: Skin is warm and dry.  Psychiatric: She has a normal mood and  affect. Her behavior is normal. Judgment and thought content normal.      Assessment & Plan:   Aimee [name] was seen today for shoulder pain.  Diagnoses and all orders for this visit:  Arthropathy -     Arthritis Panel -     VITAMIN D 25 Hydroxy (Vit-D Deficiency, Fractures) -     CMP14+EGFR -     Vitamin B12 -     TSH -     T4, Free -     Epstein-Barr virus VCA antibody panel -     betamethasone acetate-betamethasone sodium phosphate (CELESTONE) injection 6 mg -     Anti-DNA antibody, double-stranded -     Cancel: Centromere Antibodies; Future  Acute pain of right shoulder -     DG Shoulder Right; Future  Sore throat -     Rapid Strep Screen (Not at [facility]) -     Epstein-Barr virus VCA antibody panel  Chronic fatigue -     Arthritis Panel -     VITAMIN D 25 Hydroxy (Vit-D Deficiency, Fractures) -     CMP14+EGFR -     Vitamin B12 -     TSH -     T4, Free -     Epstein-Barr virus VCA antibody panel -     betamethasone acetate-betamethasone sodium phosphate (CELESTONE) injection 6 mg -     Anti-DNA antibody, double-stranded -     Cancel: Centromere Antibodies; Future  Other orders -     Culture, Group A Strep       I have discontinued Aimee [name]'s predniSONE. I am also having her maintain her mometasone, diclofenac sodium, blood glucose meter kit and supplies, cyanocobalamin, tiZANidine, metFORMIN, atorvastatin, citalopram, ezetimibe, amLODipine, omeprazole, omega-3 acid ethyl esters, and gabapentin. We administered betamethasone acetate-betamethasone sodium phosphate.  Allergies as of 11/14/2017      Reactions   Hydrocodone Itching, Other (See Comments)   And family of drugs   Oxycodone Itching   Tramadol Itching   Mirtazapine Other (See Comments)   xs hunger, foot tapping idiosynchronously   Darvocet [propoxyphene N-acetaminophen] Itching      Medication List        Accurate as of 11/14/17  9:27 PM. Always use your most recent med list.            amLODipine 10 MG tablet Commonly known as:  NORVASC Take 1 tablet (10 mg total) by mouth daily.   atorvastatin 40 MG tablet Commonly known as:  LIPITOR Take 1 tablet (40 mg total) by mouth daily.   blood glucose meter kit and supplies Kit Dispense based on patient and insurance preference. Use up to four times daily as directed. (FOR ICD-9 250.00, 250.01).   citalopram 40 MG tablet Commonly known as:  CELEXA TAKE 1 TABLETS (40 MG TOTAL) BY MOUTH DAILY.   cyanocobalamin 1000 MCG/ML injection Commonly known as:  (VITAMIN B-12) Inject 1 mL (1,000 mcg total) into the muscle every 30 (thirty) days.   diclofenac sodium 1 % Gel Commonly known as:  VOLTAREN Apply 4 g topically 4 (four) times daily.  ezetimibe 10 MG tablet Commonly known as:  ZETIA Take 1 tablet (10 mg total) by mouth daily.   gabapentin 600 MG tablet Commonly known as:  NEURONTIN TAKE 1 TABLET EVERY MORNING , 2 TABS IN THE AFTERNOON, AND TAKE 3 TABLETS AT BEDTIME   metFORMIN 500 MG tablet Commonly known as:  GLUCOPHAGE Take 1 tablet (500 mg total) by mouth daily with breakfast.   mometasone 0.1 % lotion Commonly known as:  ELOCON APPLY TOPICALLY DAILY.   omega-3 acid ethyl esters 1 g capsule Commonly known as:  LOVAZA TAKE 2 CAPSULE 2 TIMES A DAY   omeprazole 20 MG capsule Commonly known as:  PRILOSEC Take 1 capsule (20 mg total) by mouth daily.   tiZANidine 4 MG tablet Commonly known as:  ZANAFLEX Take 1.5 tablets (6 mg total) by mouth every 6 (six) hours as needed for muscle spasms.        Follow-up: Return in about 1 week (around 11/21/2017).  Claretta Fraise, M.D.

## 2017-11-15 ENCOUNTER — Other Ambulatory Visit: Payer: Self-pay | Admitting: *Deleted

## 2017-11-15 LAB — CMP14+EGFR
ALBUMIN: 4.2 g/dL (ref 3.5–5.5)
ALK PHOS: 64 IU/L (ref 39–117)
ALT: 18 IU/L (ref 0–32)
AST: 11 IU/L (ref 0–40)
Albumin/Globulin Ratio: 1.4 (ref 1.2–2.2)
BILIRUBIN TOTAL: 0.4 mg/dL (ref 0.0–1.2)
BUN / CREAT RATIO: 20 (ref 9–23)
BUN: 11 mg/dL (ref 6–24)
CHLORIDE: 101 mmol/L (ref 96–106)
CO2: 24 mmol/L (ref 20–29)
Calcium: 9.5 mg/dL (ref 8.7–10.2)
Creatinine, Ser: 0.55 mg/dL — ABNORMAL LOW (ref 0.57–1.00)
GFR calc non Af Amer: 112 mL/min/{1.73_m2} (ref >59)
GFR, EST AFRICAN AMERICAN: 129 mL/min/{1.73_m2} (ref >59)
GLOBULIN, TOTAL: 2.9 g/dL (ref 1.5–4.5)
Glucose: 87 mg/dL (ref 65–99)
Potassium: 3.9 mmol/L (ref 3.5–5.2)
SODIUM: 141 mmol/L (ref 134–144)
Total Protein: 7.1 g/dL (ref 6.0–8.5)

## 2017-11-15 LAB — ARTHRITIS PANEL
Basophils Absolute: 0 10*3/uL (ref 0.0–0.2)
Basos: 0 %
EOS (ABSOLUTE): 0.1 10*3/uL (ref 0.0–0.4)
Eos: 1 %
HEMOGLOBIN: 14.8 g/dL (ref 11.1–15.9)
Hematocrit: 43.7 % (ref 34.0–46.6)
Immature Grans (Abs): 0 10*3/uL (ref 0.0–0.1)
Immature Granulocytes: 0 %
LYMPHS ABS: 3.9 10*3/uL — AB (ref 0.7–3.1)
Lymphs: 34 %
MCH: 31.7 pg (ref 26.6–33.0)
MCHC: 33.9 g/dL (ref 31.5–35.7)
MCV: 94 fL (ref 79–97)
MONOS ABS: 0.6 10*3/uL (ref 0.1–0.9)
Monocytes: 5 %
NEUTROS ABS: 6.8 10*3/uL (ref 1.4–7.0)
Neutrophils: 60 %
Platelets: 288 10*3/uL (ref 150–379)
RBC: 4.67 x10E6/uL (ref 3.77–5.28)
RDW: 13.9 % (ref 12.3–15.4)
Sed Rate: 32 mm/hr (ref 0–32)
URIC ACID: 4.8 mg/dL (ref 2.5–7.1)
WBC: 11.5 10*3/uL — ABNORMAL HIGH (ref 3.4–10.8)

## 2017-11-15 LAB — EPSTEIN-BARR VIRUS VCA ANTIBODY PANEL
EBV Early Antigen Ab, IgG: 12.5 U/mL — ABNORMAL HIGH (ref 0.0–8.9)
EBV NA IGG: 529 U/mL — AB (ref 0.0–17.9)
EBV VCA IgG: 123 U/mL — ABNORMAL HIGH (ref 0.0–17.9)
EBV VCA IgM: <36 U/mL (ref 0.0–35.9)

## 2017-11-15 LAB — TSH: TSH: 0.944 u[IU]/mL (ref 0.450–4.500)

## 2017-11-15 LAB — VITAMIN D 25 HYDROXY (VIT D DEFICIENCY, FRACTURES): Vit D, 25-Hydroxy: 35.9 ng/mL (ref 30.0–100.0)

## 2017-11-15 LAB — T4, FREE: Free T4: 1.32 ng/dL (ref 0.82–1.77)

## 2017-11-15 LAB — ANTI-DNA ANTIBODY, DOUBLE-STRANDED: DSDNA AB: 2 [IU]/mL (ref 0–9)

## 2017-11-15 LAB — VITAMIN B12: VITAMIN B 12: 529 pg/mL (ref 232–1245)

## 2017-11-15 MED ORDER — GABAPENTIN 600 MG PO TABS: ORAL_TABLET | ORAL | 0 refills | Status: DC

## 2017-11-15 MED ORDER — CYANOCOBALAMIN 1000 MCG/ML IJ SOLN: 1000.0000 ug | INTRAMUSCULAR | 1 refills | Status: DC

## 2017-11-16 ENCOUNTER — Encounter: Payer: Self-pay | Admitting: Family Medicine

## 2017-11-16 ENCOUNTER — Ambulatory Visit
Admission: RE | Admit: 2017-11-16 | Discharge: 2017-11-16 | Disposition: A | Discharge status: Home / Self Care | Payer: BLUE CROSS/BLUE SHIELD | Source: Ambulatory Visit | Attending: Family Medicine | Admitting: Family Medicine

## 2017-11-16 DIAGNOSIS — Z139 Encounter for screening, unspecified: Secondary | ICD-10-CM

## 2017-11-16 DIAGNOSIS — Z1231 Encounter for screening mammogram for malignant neoplasm of breast: Secondary | ICD-10-CM | POA: Diagnosis not present

## 2017-11-17 ENCOUNTER — Other Ambulatory Visit: Payer: Self-pay | Admitting: Family Medicine

## 2017-11-17 DIAGNOSIS — R162 Hepatomegaly with splenomegaly, not elsewhere classified: Secondary | ICD-10-CM

## 2017-11-17 NOTE — Telephone Encounter (Signed)
Mono can make your liver swell and cause right upper abdomen pain. If it is very painful I can order an ultrasound to checkon it. However, there is no tretment for mono. You just have to rest. There is nothing to do for the fatigue except to rest.

## 2017-11-20 ENCOUNTER — Other Ambulatory Visit: Payer: Self-pay | Admitting: Family Medicine

## 2017-11-20 MED ORDER — TRAZODONE HCL 150 MG PO TABS: ORAL_TABLET | ORAL | 5 refills | Status: DC

## 2017-11-21 ENCOUNTER — Ambulatory Visit: Payer: BLUE CROSS/BLUE SHIELD | Admitting: Family Medicine

## 2017-11-21 ENCOUNTER — Encounter: Payer: Self-pay | Admitting: Family Medicine

## 2017-11-21 VITALS — BP 101/67 | HR 95 | Temp 97.3°F | Ht 68.0 in | Wt 208.0 lb

## 2017-11-21 DIAGNOSIS — B2790 Infectious mononucleosis, unspecified without complication: Secondary | ICD-10-CM | POA: Diagnosis not present

## 2017-11-21 DIAGNOSIS — E1142 Type 2 diabetes mellitus with diabetic polyneuropathy: Secondary | ICD-10-CM

## 2017-11-21 DIAGNOSIS — E781 Pure hyperglyceridemia: Secondary | ICD-10-CM | POA: Diagnosis not present

## 2017-11-21 MED ORDER — OMEPRAZOLE 20 MG PO CPDR: 20.0000 mg | DELAYED_RELEASE_CAPSULE | Freq: Every day | ORAL | 1 refills | Status: DC

## 2017-11-21 MED ORDER — AMLODIPINE BESYLATE 10 MG PO TABS: 10.0000 mg | ORAL_TABLET | Freq: Every day | ORAL | 1 refills | Status: DC

## 2017-11-21 MED ORDER — ATORVASTATIN CALCIUM 40 MG PO TABS: 40.0000 mg | ORAL_TABLET | Freq: Every day | ORAL | 1 refills | Status: DC

## 2017-11-21 MED ORDER — CITALOPRAM HYDROBROMIDE 40 MG PO TABS: ORAL_TABLET | ORAL | 1 refills | Status: DC

## 2017-11-21 MED ORDER — LANCETS MISC: 3 refills | Status: AC

## 2017-11-21 MED ORDER — GLUCOSE BLOOD VI STRP: ORAL_STRIP | 3 refills | Status: AC

## 2017-11-21 MED ORDER — METFORMIN HCL 500 MG PO TABS: 500.0000 mg | ORAL_TABLET | Freq: Every day | ORAL | 1 refills | Status: DC

## 2017-11-21 MED ORDER — EZETIMIBE 10 MG PO TABS: 10.0000 mg | ORAL_TABLET | Freq: Every day | ORAL | 1 refills | Status: DC

## 2017-11-21 MED ORDER — BLOOD GLUCOSE MONITOR SYSTEM W/DEVICE KIT: 1.0000 | PACK | Freq: Two times a day (BID) | 0 refills | Status: DC

## 2017-11-21 MED ORDER — TIZANIDINE HCL 4 MG PO TABS: 6.0000 mg | ORAL_TABLET | Freq: Four times a day (QID) | ORAL | 2 refills | Status: DC | PRN

## 2017-11-21 NOTE — Progress Notes (Signed)
Subjective:  Patient ID: Aimee [name], female    DOB: Dec 25, 1970  Age: 47 y.o. MRN: [id number]  CC: Diabetes (pt here today for follow up of her chronic medical conditions and to go over test results)   HPI Aimee [name] presents forFollow-up of diabetes. Patient has not been checking blood sugar at home.  It was doing well so she quit doing that when she was uninsured.  Now she is into determine if her dose of metformin as previously decreased to once daily is still adequate to control her glucose. Patient denies symptoms such as polyuria, polydipsia, excessive hunger, nausea No significant hypoglycemic spells noted. Medications reviewed. Pt reports taking them regularly without complication/adverse reaction being reported today.  Patient is also currently dealing with a mononucleosis infection diagnosed about a week ago.  She is constantly tired that she has been in a miserable state of feeling tired but not able to get to sleep.  Called in about that several days ago and has started trazodone with significant improvement.  He had originally requested to be placed on Ambien.  That request was deferred until after she tried the trazodone.  Since the medicine was successful for the first night or 2 will give it a complete trial.  History Aimee [name] has a past medical history of Depression, Diabetes mellitus, Hypertriglyceridemia, and PTSD (post-traumatic stress disorder).   She has a past surgical history that includes Abdominoplasty (2006); Inner ear surgery; Thoracic disc surgery (2011, 2012); Abdominal hysterectomy (2010); Cesarean section (1991, 1995); Shoulder arthroscopy (Left, 10/2015); and Breast excisional biopsy (Right).   Her family history includes Breast cancer in her maternal aunt; Cancer in her maternal aunt and maternal grandmother; Diabetes in her father; GI problems in her mother; Heart attack in her maternal grandfather, paternal grandfather, paternal grandmother, and paternal  uncle.She reports that she has been smoking cigarettes.  She has a 23.00 pack-year smoking history. she has never used smokeless tobacco. She reports that she does not drink alcohol or use drugs.  Current Outpatient Medications on File Prior to Visit  Medication Sig Dispense Refill  . blood glucose meter kit and supplies KIT Dispense based on patient and insurance preference. Use up to four times daily as directed. (FOR ICD-9 250.00, 250.01). 1 each 0  . cyanocobalamin (,VITAMIN B-12,) 1000 MCG/ML injection Inject 1 mL (1,000 mcg total) into the muscle every 30 (thirty) days. 3 mL 1  . diclofenac sodium (VOLTAREN) 1 % GEL Apply 4 g topically 4 (four) times daily. (Patient taking differently: Apply 4 g topically 4 (four) times daily as needed (neck and back pain). ) 100 Tube 0  . gabapentin (NEURONTIN) 600 MG tablet TAKE 1 TABLET EVERY MORNING , 2 TABS IN THE AFTERNOON, AND TAKE 3 TABLETS AT BEDTIME 540 tablet 0  . mometasone (ELOCON) 0.1 % lotion APPLY TOPICALLY DAILY. (Patient taking differently: Apply 1 application topically daily as needed (for skin irritation (ears)). ) 180 mL 3  . omega-3 acid ethyl esters (LOVAZA) 1 g capsule TAKE 2 CAPSULE 2 TIMES A DAY 120 capsule 3  . traZODone (DESYREL) 150 MG tablet Use from 1/3 to 1 tablet nightly as needed for sleep. 30 tablet 5   No current facility-administered medications on file prior to visit.     ROS Review of Systems  Constitutional: Positive for fatigue. Negative for activity change, appetite change and fever.  HENT: Negative for congestion, rhinorrhea and sore throat.   Eyes: Negative for visual disturbance.  Respiratory: Negative for cough and  shortness of breath.   Cardiovascular: Negative for chest pain and palpitations.  Gastrointestinal: Negative for abdominal pain, diarrhea and nausea.  Genitourinary: Negative for dysuria.  Musculoskeletal: Positive for arthralgias and myalgias.  Psychiatric/Behavioral: Positive for dysphoric mood  and sleep disturbance.    Objective:  BP 101/67   Pulse 95   Temp (!) 97.3 F (36.3 C) (Oral)   Ht _0  (1.727 m)   Wt 208 lb (94.3 kg)   BMI 31.63 kg/m   BP Readings from Last 3 Encounters:  11/21/17 101/67  11/14/17 117/81  11/03/17 112/75    Wt Readings from Last 3 Encounters:  11/21/17 208 lb (94.3 kg)  11/14/17 204 lb (92.5 kg)  11/03/17 205 lb (93 kg)     Physical Exam  Constitutional: She is oriented to person, place, and time. She appears well-developed and well-nourished. No distress.  HENT:  Head: Normocephalic and atraumatic.  Right Ear: External ear normal.  Left Ear: External ear normal.  Nose: Nose normal.  Mouth/Throat: Oropharynx is clear and moist.  Eyes: Conjunctivae and EOM are normal. Pupils are equal, round, and reactive to light.  Neck: Normal range of motion. Neck supple. No thyromegaly present.  Cardiovascular: Normal rate, regular rhythm and normal heart sounds.  No murmur heard. Pulmonary/Chest: Effort normal and breath sounds normal. No respiratory distress. She has no wheezes. She has no rales.  Abdominal: Soft. Bowel sounds are normal. She exhibits no distension and no mass. There is tenderness (Right upper quadrant). There is no rebound and no guarding.  Lymphadenopathy:    She has no cervical adenopathy.  Neurological: She is alert and oriented to person, place, and time. She has normal reflexes.  Skin: Skin is warm and dry.  Psychiatric: She has a normal mood and affect. Her behavior is normal. Judgment and thought content normal.    No components found for: BAYER     Assessment & Plan:   [name] was seen today for diabetes.  Diagnoses and all orders for this visit:  Type 2 diabetes mellitus with diabetic polyneuropathy, without long-term current use of insulin (HCC) -     Bayer DCA Hb A1c Waived  Mononucleosis  Hypertriglyceridemia -     Lipid panel  Other orders -     amLODipine (NORVASC) 10 MG tablet; Take 1 tablet  (10 mg total) by mouth daily. -     atorvastatin (LIPITOR) 40 MG tablet; Take 1 tablet (40 mg total) by mouth daily. -     citalopram (CELEXA) 40 MG tablet; TAKE 1 TABLETS (40 MG TOTAL) BY MOUTH DAILY. -     ezetimibe (ZETIA) 10 MG tablet; Take 1 tablet (10 mg total) by mouth daily. -     metFORMIN (GLUCOPHAGE) 500 MG tablet; Take 1 tablet (500 mg total) by mouth daily with breakfast. -     omeprazole (PRILOSEC) 20 MG capsule; Take 1 capsule (20 mg total) by mouth daily. -     tiZANidine (ZANAFLEX) 4 MG tablet; Take 1.5 tablets (6 mg total) by mouth every 6 (six) hours as needed for muscle spasms. -     Blood Glucose Monitoring Suppl (BLOOD GLUCOSE MONITOR SYSTEM) w/Device KIT; 1 Device by Does not apply route 2 (two) times daily. -     Lancets MISC; Use to check blood sugars twice daily -     glucose blood test strip; Use as instructed      I am having Quandra Olinde start on Blood Glucose Monitor System, Lancets, and glucose  blood. I am also having her maintain her mometasone, diclofenac sodium, blood glucose meter kit and supplies, omega-3 acid ethyl esters, cyanocobalamin, gabapentin, traZODone, amLODipine, atorvastatin, citalopram, ezetimibe, metFORMIN, omeprazole, and tiZANidine.  Meds ordered this encounter  Medications  . amLODipine (NORVASC) 10 MG tablet    Sig: Take 1 tablet (10 mg total) by mouth daily.    Dispense:  90 tablet    Refill:  1  . atorvastatin (LIPITOR) 40 MG tablet    Sig: Take 1 tablet (40 mg total) by mouth daily.    Dispense:  90 tablet    Refill:  1  . citalopram (CELEXA) 40 MG tablet    Sig: TAKE 1 TABLETS (40 MG TOTAL) BY MOUTH DAILY.    Dispense:  90 tablet    Refill:  1  . ezetimibe (ZETIA) 10 MG tablet    Sig: Take 1 tablet (10 mg total) by mouth daily.    Dispense:  90 tablet    Refill:  1  . metFORMIN (GLUCOPHAGE) 500 MG tablet    Sig: Take 1 tablet (500 mg total) by mouth daily with breakfast.    Dispense:  90 tablet    Refill:  1  .  omeprazole (PRILOSEC) 20 MG capsule    Sig: Take 1 capsule (20 mg total) by mouth daily.    Dispense:  90 capsule    Refill:  1  . tiZANidine (ZANAFLEX) 4 MG tablet    Sig: Take 1.5 tablets (6 mg total) by mouth every 6 (six) hours as needed for muscle spasms.    Dispense:  180 tablet    Refill:  2  . Blood Glucose Monitoring Suppl (BLOOD GLUCOSE MONITOR SYSTEM) w/Device KIT    Sig: 1 Device by Does not apply route 2 (two) times daily.    Dispense:  1 each    Refill:  0  . Lancets MISC    Sig: Use to check blood sugars twice daily    Dispense:  200 each    Refill:  3  . glucose blood test strip    Sig: Use as instructed    Dispense:  200 each    Refill:  3     Follow-up: Return in about 3 months (around 02/21/2018), or if symptoms worsen or fail to improve.  Claretta Fraise, M.D.

## 2017-11-24 ENCOUNTER — Other Ambulatory Visit: Payer: Self-pay | Admitting: Pediatrics

## 2017-11-24 ENCOUNTER — Other Ambulatory Visit: Payer: Self-pay | Admitting: Family Medicine

## 2017-11-25 NOTE — Telephone Encounter (Signed)
Last seen 11/21/17  Dr [name]

## 2017-11-27 ENCOUNTER — Ambulatory Visit (HOSPITAL_COMMUNITY): Payer: BLUE CROSS/BLUE SHIELD

## 2017-11-28 ENCOUNTER — Other Ambulatory Visit: Payer: Self-pay | Admitting: Family Medicine

## 2017-11-28 DIAGNOSIS — R162 Hepatomegaly with splenomegaly, not elsewhere classified: Secondary | ICD-10-CM

## 2017-11-30 DIAGNOSIS — J3501 Chronic tonsillitis: Secondary | ICD-10-CM | POA: Diagnosis not present

## 2017-11-30 DIAGNOSIS — J351 Hypertrophy of tonsils: Secondary | ICD-10-CM | POA: Diagnosis not present

## 2017-12-01 ENCOUNTER — Telehealth: Payer: Self-pay | Admitting: *Deleted

## 2017-12-01 NOTE — Telephone Encounter (Signed)
Fax received [facility] Pt would like to received 90d supply from mail order pharmacy instead of local [facility] Please advise and send new Rx

## 2017-12-04 ENCOUNTER — Ambulatory Visit
Admission: RE | Admit: 2017-12-04 | Discharge: 2017-12-04 | Disposition: A | Discharge status: Home / Self Care | Payer: BLUE CROSS/BLUE SHIELD | Source: Ambulatory Visit | Attending: Family Medicine | Admitting: Family Medicine

## 2017-12-04 ENCOUNTER — Encounter: Payer: Self-pay | Admitting: Family Medicine

## 2017-12-04 ENCOUNTER — Telehealth: Payer: Self-pay | Admitting: Family Medicine

## 2017-12-04 ENCOUNTER — Other Ambulatory Visit: Payer: Self-pay | Admitting: Family Medicine

## 2017-12-04 ENCOUNTER — Other Ambulatory Visit: Payer: Self-pay

## 2017-12-04 DIAGNOSIS — R162 Hepatomegaly with splenomegaly, not elsewhere classified: Secondary | ICD-10-CM

## 2017-12-04 DIAGNOSIS — K824 Cholesterolosis of gallbladder: Secondary | ICD-10-CM

## 2017-12-04 MED ORDER — CITALOPRAM HYDROBROMIDE 40 MG PO TABS: ORAL_TABLET | ORAL | 1 refills | Status: DC

## 2017-12-04 MED ORDER — OMEGA-3-ACID ETHYL ESTERS 1 G PO CAPS: ORAL_CAPSULE | ORAL | 1 refills | Status: DC

## 2017-12-04 MED ORDER — GABAPENTIN 600 MG PO TABS: ORAL_TABLET | ORAL | 0 refills | Status: DC

## 2017-12-04 MED ORDER — TRAZODONE HCL 150 MG PO TABS: ORAL_TABLET | ORAL | 1 refills | Status: DC

## 2017-12-04 MED ORDER — DICLOFENAC SODIUM 1 % TD GEL: 4.0000 g | Freq: Four times a day (QID) | TRANSDERMAL | 1 refills | Status: DC | PRN

## 2017-12-04 MED ORDER — AMLODIPINE BESYLATE 10 MG PO TABS: 10.0000 mg | ORAL_TABLET | Freq: Every day | ORAL | 1 refills | Status: DC

## 2017-12-04 MED ORDER — EZETIMIBE 10 MG PO TABS: 10.0000 mg | ORAL_TABLET | Freq: Every day | ORAL | 1 refills | Status: DC

## 2017-12-04 MED ORDER — ATORVASTATIN CALCIUM 40 MG PO TABS: 40.0000 mg | ORAL_TABLET | Freq: Every day | ORAL | 1 refills | Status: DC

## 2017-12-04 MED ORDER — METFORMIN HCL 500 MG PO TABS: 500.0000 mg | ORAL_TABLET | Freq: Every day | ORAL | 1 refills | Status: DC

## 2017-12-04 MED ORDER — OMEPRAZOLE 20 MG PO CPDR: 20.0000 mg | DELAYED_RELEASE_CAPSULE | Freq: Every day | ORAL | 1 refills | Status: DC

## 2017-12-04 NOTE — Telephone Encounter (Signed)
Please review CT result and route to Pool B so patient can be contacted

## 2017-12-04 NOTE — Telephone Encounter (Signed)
Okay to refill all meds for 6 mos

## 2017-12-04 NOTE — Telephone Encounter (Signed)
[facility]

## 2017-12-05 ENCOUNTER — Encounter: Payer: Self-pay | Admitting: Physician Assistant

## 2017-12-06 ENCOUNTER — Other Ambulatory Visit: Payer: Self-pay | Admitting: Family Medicine

## 2017-12-06 ENCOUNTER — Telehealth: Payer: Self-pay | Admitting: Family Medicine

## 2017-12-06 MED ORDER — FENOFIBRATE 160 MG PO TABS: 160.0000 mg | ORAL_TABLET | Freq: Every day | ORAL | 3 refills | Status: DC

## 2017-12-08 ENCOUNTER — Other Ambulatory Visit (INDEPENDENT_AMBULATORY_CARE_PROVIDER_SITE_OTHER): Payer: BLUE CROSS/BLUE SHIELD

## 2017-12-08 ENCOUNTER — Encounter: Payer: Self-pay | Admitting: Physician Assistant

## 2017-12-08 ENCOUNTER — Ambulatory Visit: Payer: BLUE CROSS/BLUE SHIELD | Admitting: Physician Assistant

## 2017-12-08 VITALS — BP 98/64 | HR 89 | Ht 68.0 in | Wt 209.0 lb

## 2017-12-08 DIAGNOSIS — K824 Cholesterolosis of gallbladder: Secondary | ICD-10-CM

## 2017-12-08 DIAGNOSIS — K760 Fatty (change of) liver, not elsewhere classified: Secondary | ICD-10-CM | POA: Diagnosis not present

## 2017-12-08 DIAGNOSIS — R1011 Right upper quadrant pain: Secondary | ICD-10-CM | POA: Diagnosis not present

## 2017-12-08 LAB — FERRITIN: Ferritin: 18.9 ng/mL (ref 10.0–291.0)

## 2017-12-08 LAB — HEPATIC FUNCTION PANEL
ALK PHOS: 78 U/L (ref 39–117)
ALT: 18 U/L (ref 0–35)
AST: 12 U/L (ref 0–37)
Albumin: 3.9 g/dL (ref 3.5–5.2)
BILIRUBIN DIRECT: 0.1 mg/dL (ref 0.0–0.3)
TOTAL PROTEIN: 7.3 g/dL (ref 6.0–8.3)
Total Bilirubin: 0.4 mg/dL (ref 0.2–1.2)

## 2017-12-08 NOTE — Progress Notes (Signed)
Reviewed and agree with initial management plan.  Jocee Kissick T. Richmond Coldren, MD FACG 

## 2017-12-08 NOTE — Patient Instructions (Signed)
Please go to the basement level to have your labs drawn.  You have been scheduled for a HIDA scan at [facility] (1st floor) on Thursday 12-14-2017  Please arrive at 7:15 am to your scheduled appointment at  6:43 am. Make certain not to have anything to eat or drink at least after midnight to your test. Should this appointment date or time not work well for you, please call radiology scheduling at [phone].   We are referring you [facility] for a consult.  You will be getting a call with the appointment details.  _____________________________________________________________________ hepatobiliary (HIDA) scan is an imaging procedure used to diagnose problems in the liver, gallbladder and bile ducts. In the HIDA scan, a radioactive chemical or tracer is injected into a vein in your arm. The tracer is handled by the liver like bile. Bile is a fluid produced and excreted by your liver that helps your digestive system break down fats in the foods you eat. Bile is stored in your gallbladder and the gallbladder releases the bile when you eat a meal. A special nuclear medicine scanner (gamma camera) tracks the flow of the tracer from your liver into your gallbladder and small intestine.  During your HIDA scan  You'll be asked to change into a hospital gown before your HIDA scan begins. Your health care team will position you on a table, usually on your back. The radioactive tracer is then injected into a vein in your arm.The tracer travels through your bloodstream to your liver, where it's taken up by the bile-producing cells. The radioactive tracer travels with the bile from your liver into your gallbladder and through your bile ducts to your small intestine.You may feel some pressure while the radioactive tracer is injected into your vein. As you lie on the table, a special gamma camera is positioned over your abdomen taking pictures of the tracer as it moves through your body. The gamma  camera takes pictures continually for about an hour. You'll need to keep still during the HIDA scan. This can become uncomfortable, but you may find that you can lessen the discomfort by taking deep breaths and thinking about other things. Tell your health care team if you're uncomfortable. The radiologist will watch on a computer the progress of the radioactive tracer through your body. The HIDA scan may be stopped when the radioactive tracer is seen in the gallbladder and enters your small intestine. This typically takes about an hour. In some cases extra imaging will be performed if original images aren't satisfactory, if morphine is given to help visualize the gallbladder or if the medication CCK is given to look at the contraction of the gallbladder. This test typically takes 2 hours to complete. ________________________________________________________________________

## 2017-12-08 NOTE — Progress Notes (Signed)
Subjective:    Patient ID: Aimee [name], female    DOB: 1971/08/18, 47 y.o.   MRN: [id number]  HPI Aimee [name] is a pleasant 47 year old white female, new to GI today referred by Dr. Claretta Fraise for evaluation of upper abdominal pain.  Patient has not had any prior GI evaluation.  She has history of hypertension, adult onset diabetes mellitus, PTSD, and obesity.  She has lost quite a bit of weight over the past couple of years.  Also with history of depression. She says she has been having significant right-sided abdominal pain over the past 2-3 weeks, and says she feels like she is "dying" at times.  She describes this as a pressure stabbing type of pain which is been severe at times, and upper abdominal bloating sensation.  She says she has had mild right-sided abdominal discomfort sometimes around into her back over the past couple of years she had thought that was musculoskeletal in etiology.  This pain is present on a daily basis and throughout the day comes and goes.  Is definitely exacerbated by eating and usually comes on 10-15 minutes after eating in the last for hours.  It has been associated with nausea but no vomiting.  No fever or chills.  She is also developed some urgency for bowel movements at times postprandially.  She says she has not been eating very much over the past week because everything she eats brings on the pain.  She is been eating plain bread and was able to eat some carrots yesterday without pain. Ultrasound was done on 12/04/2017 no mobile gallstones noted, she has several foci along the gallbladder wall consistent with cholesterol polyps, the largest to 3 mm in size no significant gallbladder wall thickening, CBD 4 mm.  She has fatty infiltration of the liver which is borderline enlarged, normal-sized spleen.  Labs were done on 11/14/2017 with normal LFTs. Patient says her mother is status post gallbladder.  She had an aunt with liver disease who apparently developed a liver  cancer and is deceased. Patient is also been diagnosed with C. difficile in the past, the last episode was a couple of years ago and says she has had some IBS symptoms since then.  Review of Systems Pertinent positive and negative review of systems were noted in the above HPI section.  All other review of systems was otherwise negative.  Outpatient Encounter Medications as of 12/08/2017  Medication Sig  . amLODipine (NORVASC) 10 MG tablet Take 1 tablet (10 mg total) by mouth daily.  Marland Kitchen atorvastatin (LIPITOR) 40 MG tablet Take 1 tablet (40 mg total) by mouth daily.  . blood glucose meter kit and supplies KIT Dispense based on patient and insurance preference. Use up to four times daily as directed. (FOR ICD-9 250.00, 250.01).  . Blood Glucose Monitoring Suppl (BLOOD GLUCOSE MONITOR SYSTEM) w/Device KIT 1 Device by Does not apply route 2 (two) times daily.  . citalopram (CELEXA) 40 MG tablet TAKE 1 TABLETS (40 MG TOTAL) BY MOUTH DAILY.  . cyanocobalamin (,VITAMIN B-12,) 1000 MCG/ML injection Inject 1 mL (1,000 mcg total) into the muscle every 30 (thirty) days.  . diclofenac sodium (VOLTAREN) 1 % GEL Apply 4 g topically 4 (four) times daily as needed (neck and back pain).  Marland Kitchen ezetimibe (ZETIA) 10 MG tablet Take 1 tablet (10 mg total) by mouth daily.  . fenofibrate 160 MG tablet Take 1 tablet (160 mg total) by mouth daily. For cholesterol and triglyceride  . gabapentin (NEURONTIN) 600  MG tablet TAKE 1 TABLET EVERY MORNING , 2 TABS IN THE AFTERNOON, AND TAKE 3 TABLETS AT BEDTIME  . glucose blood test strip Use as instructed  . Lancets MISC Use to check blood sugars twice daily  . metFORMIN (GLUCOPHAGE) 500 MG tablet Take 1 tablet (500 mg total) by mouth daily with breakfast.  . mometasone (ELOCON) 0.1 % lotion APPLY TOPICALLY DAILY. (Patient taking differently: Apply 1 application topically daily as needed (for skin irritation (ears)). )  . omega-3 acid ethyl esters (LOVAZA) 1 g capsule TAKE 2 CAPSULE 2  TIMES A DAY  . omeprazole (PRILOSEC) 20 MG capsule Take 1 capsule (20 mg total) by mouth daily.  Marland Kitchen tiZANidine (ZANAFLEX) 4 MG tablet Take 1.5 tablets (6 mg total) by mouth every 6 (six) hours as needed for muscle spasms.  . traZODone (DESYREL) 150 MG tablet Use from 1/3 to 1 tablet nightly as needed for sleep.  . [DISCONTINUED] benzonatate (TESSALON) 100 MG capsule TAKE 1 CAPSULE (100 MG TOTAL) BY MOUTH 2 (TWO) TIMES DAILY AS NEEDED FOR COUGH.  . [DISCONTINUED] doxycycline (VIBRA-TABS) 100 MG tablet TAKE 1 TABLET (100 MG TOTAL) BY MOUTH 2 (TWO) TIMES DAILY.   No facility-administered encounter medications on file as of 12/08/2017.    Allergies  Allergen Reactions  . Hydrocodone Itching and Other (See Comments)    And family of drugs   . Oxycodone Itching  . Tramadol Itching  . Mirtazapine Other (See Comments)    xs hunger, foot tapping idiosynchronously  . Percocet [Oxycodone-Acetaminophen]   . Darvocet [Propoxyphene N-Acetaminophen] Itching   Patient Active Problem List   Diagnosis Date Noted  . Ganglion cyst of finger 09/13/2017  . Status post placement of bone anchored hearing aid (BAHA) 08/14/2017  . Fibromyalgia 11/29/2016  . Lumbar radiculopathy 11/21/2016  . Essential hypertension 04/22/2016  . Severe obesity (BMI >= 40) ([place]) 10/08/2015  . PTSD (post-traumatic stress disorder) 09/10/2015  . Trapezius strain 08/19/2015  . Depression 08/19/2015  . Hypertriglyceridemia 11/12/2014  . Peripheral neuropathy 11/12/2014  . DDD (degenerative disc disease), lumbar 11/12/2014  . Diabetes ([place]) 03/05/2013  . Smoking 03/05/2013   Social History   Socioeconomic History  . Marital status: Married    Spouse name: Aimee [name]  . Number of children: 3  . Years of education: 37  . Highest education level: Not on file  Occupational History    Comment: Aimee [name]  Social Needs  . Financial resource strain: Not on file  . Food insecurity:    Worry: Not on file    Inability: Not on  file  . Transportation needs:    Medical: Not on file    Non-medical: Not on file  Tobacco Use  . Smoking status: Former Smoker    Packs/day: 1.00    Years: 23.00    Pack years: 23.00    Types: Cigarettes  . Smokeless tobacco: Never Used  Substance and Sexual Activity  . Alcohol use: No    Alcohol/week: 0.0 oz  . Drug use: No  . Sexual activity: Yes    Partners: Male  Lifestyle  . Physical activity:    Days per week: Not on file    Minutes per session: Not on file  . Stress: Not on file  Relationships  . Social connections:    Talks on phone: Not on file    Gets together: Not on file    Attends religious service: Not on file    Active member of club or organization: Not on  file    Attends meetings of clubs or organizations: Not on file    Relationship status: Not on file  . Intimate partner violence:    Fear of current or ex partner: Not on file    Emotionally abused: Not on file    Physically abused: Not on file    Forced sexual activity: Not on file  Other Topics Concern  . Not on file  Social History Narrative   Lives with spouse at home   Caffeine use- coffee, 1-2 cups daily    Aimee [name]'s family history includes Bone cancer in her cousin; Breast cancer in her maternal aunt; Colon cancer in her maternal grandmother; Diabetes in her father; GI problems in her mother; Heart attack in her maternal grandfather, paternal grandfather, paternal grandmother, and paternal uncle; Liver cancer in her maternal aunt; Lymphoma in her paternal uncle.      Objective:    Vitals:   12/08/17 0830  BP: 98/64  Pulse: 89    Physical Exam; well-developed white female in no acute distress, pleasant blood pressure 98/64 pulse 89, height 5 foot 8, weight 209, BMI 31.7.  HEENT; nontraumatic normocephalic EOMI PERRLA sclera anicteric, Cardiovascular ;regular rate and rhythm with S1-S2 no murmur rub or gallop, Pulmonary; clear bilaterally, Abdomen; soft, she is tender in the right upper  quadrant there is no definite guarding or rebound no palpable mass or hepatosplenomegaly bowel sounds are present, Rectal; exam not done, Extremities ;no clubbing cyanosis or edema skin warm and dry, Neuro psych; mood and affect appropriate       Assessment & Plan:   #62 47 year old white female with 2-3-week history of daily episodic right upper quadrant pain she has been intense and stabbing in nature lasting for a couple of hours and generally exacerbated by eating. Her symptoms are very consistent with biliary colic. Recent ultrasound showed multiple foci along the gallbladder wall consistent with cholesterol polyps largest 3 mm.  No gallbladder wall thickening noted or definite mobile gallstones, normal common bile duct  #2 fatty liver-suspect this is secondary to nonalcoholic fatty liver disease.  Patient has successfully lost quite a bit of weight over the past couple of years.  No regular EtOH use.  #3 hypertension #4.  Adult onset diabetes mellitus #5.  PTSD and history of depression  Plan; Patient is advised to stay on a very low-fat bland diet Schedule for [facility] HIDA scan She will also be referred to [place] [facility] [place] [facility] for consideration of laparoscopic cholecystectomy. We will repeat hepatic panel today, check chronic hepatitis serologies, chronic autoimmune markers and markers for inheritable forms of chronic liver disease.  She will need follow-up of the fatty liver disease , after acute issues are resolved. Patient will be established with Dr. Fuller Plan.  Aimee S Esterwood PA-C 12/08/2017   Cc: Claretta Fraise, MD

## 2017-12-11 LAB — MITOCHONDRIAL/SMOOTH MUSCLE AB PNL
Mitochondrial Ab: <20 Units (ref 0.0–20.0)
SMOOTH MUSCLE AB: 25 U — AB (ref 0–19)

## 2017-12-12 ENCOUNTER — Telehealth (INDEPENDENT_AMBULATORY_CARE_PROVIDER_SITE_OTHER): Payer: Self-pay | Admitting: Orthopaedic Surgery

## 2017-12-12 LAB — HEPATITIS PANEL, ACUTE
HEP B C IGM: NONREACTIVE
HEP C AB: NONREACTIVE
Hep A IgM: NONREACTIVE
Hepatitis B Surface Ag: NONREACTIVE
SIGNAL TO CUT-OFF: 0.09 (ref <1.00)

## 2017-12-12 LAB — ANTI-NUCLEAR AB-TITER (ANA TITER)

## 2017-12-12 LAB — CERULOPLASMIN: Ceruloplasmin: 29 mg/dL (ref 18–53)

## 2017-12-12 LAB — ALPHA-1-ANTITRYPSIN: A-1 Antitrypsin, Ser: 173 mg/dL (ref 83–199)

## 2017-12-12 LAB — ANA: Anti Nuclear Antibody(ANA): POSITIVE — AB

## 2017-12-12 LAB — ANTI-SMOOTH MUSCLE ANTIBODY, IGG: Actin (Smooth Muscle) Antibody (IGG): 24 U — ABNORMAL HIGH (ref <20)

## 2017-12-12 NOTE — Telephone Encounter (Signed)
I called patient and advised Dr. Lorin Mercy is out of the office until Friday and I will call back once he responds.  Please advise.

## 2017-12-12 NOTE — Telephone Encounter (Signed)
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 She has not yet scheduled the surgery. Please advise # [phone]

## 2017-12-14 ENCOUNTER — Encounter: Payer: Self-pay | Admitting: Physician Assistant

## 2017-12-14 ENCOUNTER — Encounter: Payer: Self-pay | Admitting: Family Medicine

## 2017-12-14 ENCOUNTER — Ambulatory Visit (HOSPITAL_COMMUNITY): Payer: BLUE CROSS/BLUE SHIELD

## 2017-12-14 ENCOUNTER — Telehealth: Payer: Self-pay | Admitting: Physician Assistant

## 2017-12-14 DIAGNOSIS — R1011 Right upper quadrant pain: Secondary | ICD-10-CM

## 2017-12-15 ENCOUNTER — Encounter: Payer: Self-pay | Admitting: Physician Assistant

## 2017-12-15 ENCOUNTER — Other Ambulatory Visit: Payer: BLUE CROSS/BLUE SHIELD

## 2017-12-15 DIAGNOSIS — R1011 Right upper quadrant pain: Secondary | ICD-10-CM

## 2017-12-15 NOTE — Telephone Encounter (Signed)
Patient calling back regarding this.

## 2017-12-15 NOTE — Telephone Encounter (Signed)
Called patient

## 2017-12-15 NOTE — Telephone Encounter (Signed)
Schedule ROV to recheck ganglion on shoulder, need dictation done for request for approval for surgery etc.  thanks

## 2017-12-16 LAB — IGG: IGG (IMMUNOGLOBIN G), SERUM: 990 mg/dL (ref 700–1600)

## 2017-12-19 ENCOUNTER — Encounter: Payer: Self-pay | Admitting: Physician Assistant

## 2017-12-19 ENCOUNTER — Telehealth: Payer: Self-pay | Admitting: Physician Assistant

## 2017-12-19 NOTE — Telephone Encounter (Signed)
Pt is calling back about her lab results  [phone]

## 2017-12-19 NOTE — Telephone Encounter (Signed)
I called patient and advised. Appt scheduled for 4/26 at 3:45p.

## 2017-12-20 ENCOUNTER — Ambulatory Visit (HOSPITAL_COMMUNITY)
Admission: RE | Admit: 2017-12-20 | Discharge: 2017-12-20 | Disposition: A | Discharge status: Home / Self Care | Payer: BLUE CROSS/BLUE SHIELD | Source: Ambulatory Visit | Attending: Physician Assistant | Admitting: Physician Assistant

## 2017-12-20 ENCOUNTER — Telehealth: Payer: Self-pay | Admitting: Physician Assistant

## 2017-12-20 DIAGNOSIS — R1011 Right upper quadrant pain: Secondary | ICD-10-CM

## 2017-12-20 DIAGNOSIS — R110 Nausea: Secondary | ICD-10-CM | POA: Diagnosis not present

## 2017-12-20 DIAGNOSIS — K760 Fatty (change of) liver, not elsewhere classified: Secondary | ICD-10-CM | POA: Diagnosis not present

## 2017-12-20 MED ORDER — TECHNETIUM TC 99M MEBROFENIN IV KIT
4.9000 | PACK | Freq: Once | INTRAVENOUS | Status: AC | PRN
  Administered 2017-12-20: 4.9 via INTRAVENOUS

## 2017-12-20 NOTE — Telephone Encounter (Signed)
Pt calling inquiring about results of a test that she had done this morning.

## 2017-12-20 NOTE — Telephone Encounter (Signed)
Left message for pt that when we receive the results we will call her.

## 2017-12-20 NOTE — Telephone Encounter (Signed)
Spoke with the patient. Tried to explain she is in the diagnosing phase of this. The tests are to try to determine what is going on from a GI perspective.

## 2017-12-21 ENCOUNTER — Encounter: Payer: Self-pay | Admitting: Physician Assistant

## 2017-12-26 ENCOUNTER — Ambulatory Visit (HOSPITAL_COMMUNITY): Payer: BLUE CROSS/BLUE SHIELD

## 2017-12-26 DIAGNOSIS — J351 Hypertrophy of tonsils: Secondary | ICD-10-CM | POA: Diagnosis not present

## 2017-12-26 DIAGNOSIS — J3501 Chronic tonsillitis: Secondary | ICD-10-CM | POA: Diagnosis not present

## 2017-12-26 HISTORY — PX: TONSILLECTOMY: SUR1361

## 2017-12-27 ENCOUNTER — Encounter: Payer: Self-pay | Admitting: Family Medicine

## 2018-01-03 DIAGNOSIS — R220 Localized swelling, mass and lump, head: Secondary | ICD-10-CM | POA: Diagnosis not present

## 2018-01-03 DIAGNOSIS — K1120 Sialoadenitis, unspecified: Secondary | ICD-10-CM | POA: Diagnosis not present

## 2018-01-05 ENCOUNTER — Ambulatory Visit (INDEPENDENT_AMBULATORY_CARE_PROVIDER_SITE_OTHER): Payer: Self-pay | Admitting: Orthopaedic Surgery

## 2018-01-05 DIAGNOSIS — K760 Fatty (change of) liver, not elsewhere classified: Secondary | ICD-10-CM | POA: Diagnosis not present

## 2018-01-05 DIAGNOSIS — R768 Other specified abnormal immunological findings in serum: Secondary | ICD-10-CM | POA: Diagnosis not present

## 2018-01-05 DIAGNOSIS — R1011 Right upper quadrant pain: Secondary | ICD-10-CM | POA: Diagnosis not present

## 2018-01-08 ENCOUNTER — Ambulatory Visit: Payer: Self-pay | Admitting: General Surgery

## 2018-01-08 NOTE — Progress Notes (Signed)
Need orders in epic  Surgery on 01/11/2018.  Preop on 01/09/18.

## 2018-01-08 NOTE — Patient Instructions (Addendum)
Aimee [name]  01/08/2018   Your procedure is scheduled on: 01/11/2018   Report to [facility] Main  Entrance  Report to admitting at    0930 AM    Call this number if you have problems the morning of surgery [phone]   Remember: Do not eat food :After Midnight.  NO SOLID FOOD AFTER MIDNIGHT THE NIGHT PRIOR TO SURGERY. NOTHING BY MOUTH EXCEPT CLEAR LIQUIDS UNTIL 3 HOURS PRIOR TO [place] SURGERY. PLEASE FINISH ENSURE DRINK PER SURGEON ORDER 3 HOURS PRIOR TO SCHEDULED SURGERY TIME WHICH NEEDS TO BE COMPLETED AT ____0830________.   Take these medicines the morning of surgery with A SIP OF WATER:  Amlodipine ( NOrvasc), Celexa, Gabapentin, Prilosec  DO NOT TAKE ANY DIABETIC MEDICATIONS DAY OF YOUR SURGERY                               You may not have any metal on your body including hair pins and              piercings  Do not wear jewelry, make-up, lotions, powders or perfumes, deodorant             Do not wear nail polish.  Do not shave  48 hours prior to surgery.     Do not bring valuables to the hospital. [facility].  Contacts, dentures or bridgework may not be worn into surgery.      Patients discharged the day of surgery will not be allowed to drive home.  Name and phone number of your driver:               Please read over the following fact sheets you were given: _____________________________________________________________________             [facility] - Preparing for Surgery Before surgery, you can play an important role.  Because skin is not sterile, your skin needs to be as free of germs as possible.  You can reduce the number of germs on your skin by washing with CHG (chlorahexidine gluconate) soap before surgery.  CHG is an antiseptic cleaner which kills germs and bonds with the skin to continue killing germs even after washing. Please DO NOT use if you have an allergy to CHG or  antibacterial soaps.  If your skin becomes reddened/irritated stop using the CHG and inform your nurse when you arrive at Short Stay. Do not shave (including legs and underarms) for at least 48 hours prior to the first CHG shower.  You may shave your face/neck. Please follow these instructions carefully:  1.  Shower with CHG Soap the night before surgery and the  morning of Surgery.  2.  If you choose to wash your hair, wash your hair first as usual with your  normal  shampoo.  3.  After you shampoo, rinse your hair and body thoroughly to remove the  shampoo.                           4.  Use CHG as you would any other liquid soap.  You can apply chg directly  to the skin and wash  Gently with a scrungie or clean washcloth.  5.  Apply the CHG Soap to your body ONLY FROM THE NECK DOWN.   Do not use on face/ open                           Wound or open sores. Avoid contact with eyes, ears mouth and genitals (private parts).                       Wash face,  Genitals (private parts) with your normal soap.             6.  Wash thoroughly, paying special attention to the area where your surgery  will be performed.  7.  Thoroughly rinse your body with warm water from the neck down.  8.  DO NOT shower/wash with your normal soap after using and rinsing off  the CHG Soap.                9.  Pat yourself dry with a clean towel.            10.  Wear clean pajamas.            11.  Place clean sheets on your bed the night of your first shower and do not  sleep with pets. Day of Surgery : Do not apply any lotions/deodorants the morning of surgery.  Please wear clean clothes to the hospital/surgery center.  FAILURE TO FOLLOW THESE INSTRUCTIONS MAY RESULT IN THE CANCELLATION OF YOUR SURGERY PATIENT SIGNATURE_________________________________  NURSE SIGNATURE__________________________________  ________________________________________________________________________    CLEAR LIQUID  DIET   Foods Allowed                                                                     Foods Excluded  Coffee and tea, regular and decaf                             liquids that you cannot  Plain Jell-O in any flavor                                             see through such as: Fruit ices (not with fruit pulp)                                     milk, soups, orange juice  Iced Popsicles                                    All solid food Carbonated beverages, regular and diet                                    Cranberry, grape and apple juices Sports drinks like Gatorade Lightly seasoned clear broth or consume(fat free) Sugar, honey syrup  Sample Menu Breakfast                                Lunch                                     Supper Cranberry juice                    Beef broth                            Chicken broth Jell-O                                     Grape juice                           Apple juice Coffee or tea                        Jell-O                                      Popsicle                                                Coffee or tea                        Coffee or tea  _____________________________________________________________________

## 2018-01-09 ENCOUNTER — Encounter (HOSPITAL_COMMUNITY)
Admission: RE | Admit: 2018-01-09 | Discharge: 2018-01-09 | Disposition: A | Discharge status: Home / Self Care | Payer: BLUE CROSS/BLUE SHIELD | Source: Ambulatory Visit | Attending: General Surgery | Admitting: General Surgery

## 2018-01-09 ENCOUNTER — Other Ambulatory Visit: Payer: Self-pay

## 2018-01-09 ENCOUNTER — Encounter (HOSPITAL_COMMUNITY): Payer: Self-pay

## 2018-01-09 ENCOUNTER — Encounter (HOSPITAL_COMMUNITY): Payer: Self-pay | Admitting: *Deleted

## 2018-01-09 DIAGNOSIS — Z01810 Encounter for preprocedural cardiovascular examination: Secondary | ICD-10-CM | POA: Insufficient documentation

## 2018-01-09 DIAGNOSIS — Z01812 Encounter for preprocedural laboratory examination: Secondary | ICD-10-CM | POA: Diagnosis not present

## 2018-01-09 DIAGNOSIS — R1011 Right upper quadrant pain: Secondary | ICD-10-CM | POA: Insufficient documentation

## 2018-01-09 HISTORY — DX: Gastro-esophageal reflux disease without esophagitis: K21.9

## 2018-01-09 HISTORY — DX: Myoneural disorder, unspecified: G70.9

## 2018-01-09 HISTORY — DX: Other specified postprocedural states: R11.2

## 2018-01-09 HISTORY — DX: Essential (primary) hypertension: I10

## 2018-01-09 HISTORY — DX: Family history of other specified conditions: Z84.89

## 2018-01-09 HISTORY — DX: Unspecified osteoarthritis, unspecified site: M19.90

## 2018-01-09 HISTORY — DX: Other specified postprocedural states: Z98.890

## 2018-01-09 LAB — CBC
HCT: 43 % (ref 36.0–46.0)
HEMOGLOBIN: 14.6 g/dL (ref 12.0–15.0)
MCH: 32.4 pg (ref 26.0–34.0)
MCHC: 34 g/dL (ref 30.0–36.0)
MCV: 95.3 fL (ref 78.0–100.0)
PLATELETS: 317 10*3/uL (ref 150–400)
RBC: 4.51 MIL/uL (ref 3.87–5.11)
RDW: 12.8 % (ref 11.5–15.5)
WBC: 11.4 10*3/uL — ABNORMAL HIGH (ref 4.0–10.5)

## 2018-01-09 LAB — BASIC METABOLIC PANEL
ANION GAP: 10 (ref 5–15)
BUN: 12 mg/dL (ref 6–20)
CALCIUM: 8.8 mg/dL — AB (ref 8.9–10.3)
CO2: 25 mmol/L (ref 22–32)
CREATININE: 0.54 mg/dL (ref 0.44–1.00)
Chloride: 104 mmol/L (ref 101–111)
GFR calc non Af Amer: >60 mL/min (ref >60)
Glucose, Bld: 144 mg/dL — ABNORMAL HIGH (ref 65–99)
Potassium: 3.7 mmol/L (ref 3.5–5.1)
SODIUM: 139 mmol/L (ref 135–145)

## 2018-01-09 LAB — HEMOGLOBIN A1C
HEMOGLOBIN A1C: 7 % — AB (ref 4.8–5.6)
Mean Plasma Glucose: 154.2 mg/dL

## 2018-01-09 LAB — ABO/RH: ABO/RH(D): A POS

## 2018-01-09 LAB — GLUCOSE, CAPILLARY: GLUCOSE-CAPILLARY: 147 mg/dL — AB (ref 65–99)

## 2018-01-09 NOTE — Progress Notes (Addendum)
Final EKg done 430/19-epic  CXR- 08/09/17-epic

## 2018-01-10 ENCOUNTER — Ambulatory Visit: Payer: Self-pay | Admitting: General Surgery

## 2018-01-10 NOTE — H&P (Signed)
Aimee [name] Documented: 01/05/2018 10:36 AM Location: [facility] Patient #: [id number] DOB: Jan 11, 1971 Married / Language: [place] / Race: White Female  History of Present Illness Aimee Areola M. Savino Whisenant MD; 01/10/2018 11:38 AM) The patient is a 47 year old female who presents with non-malignant abdominal pain. She is referred by Aimee Gip, PA she states that she is been having right sided pain for many months. It is mainly after eating. It radiates to her back and causes her to double over. She has also been having diarrhea. It is occasionally associated with nausea as well as bloating. She hasn't really found anything to help with the discomfort when she has it. It will occur with different types of food. She denies any regurgitation. She does take medicine for heartburn but this is different than her typical heartburn. She is also been evaluated with lots of different blood work recently. She was found to have a positive ANA as well as Acton smooth muscle antibody. Her hepatitis panel was negative. Her LFTs were normal. She had an abdominal ultrasound which showed no gallstones. She did have some evidence of cholesterol polyps all less than 3 mm. She had evidence of hepatic steatosis on ultrasound as well as CT. She had a nuclear medicine scan which demonstrated a normal ejection fraction however she did complain of abdominal pain with oral intake during the study. She denies any weight loss. She denies any acholic stools. She hasn't found anything that exacerbates her discomfort   Problem List/Past Medical Aimee Areola M. Andrey Campanile, MD; 01/10/2018 11:41 AM) POSTPRANDIAL RUQ PAIN (R10.11) ANA POSITIVE (R76.8) HEPATIC STEATOSIS (K76.0)  Past Surgical History (Aimee [name]; 01/05/2018 10:57 AM) Breast Biopsy Left. Cesarean Section - Multiple Hysterectomy (not due to cancer) - Partial Shoulder Surgery Left. Spinal Surgery - Neck Tonsillectomy  Diagnostic Studies  History Aimee [name]; 01/05/2018 10:57 AM) Colonoscopy never Mammogram within last year  Allergies Aimee [name], [name]; 01/05/2018 10:37 AM) HYDROcodone Bitartrate *CHEMICALS* Percocet *ANALGESICS - OPIOID* TraMADol HCl *ANALGESICS - OPIOID*  Medication History (Aimee [name]; 01/05/2018 10:40 AM) AmLODIPine Besylate (10MG  Tablet, Oral) Active. Amoxicillin (400MG /5ML For Suspension, Oral) Active. Atorvastatin Calcium (40MG  Tablet, Oral) Active. Fenofibrate (160MG  Tablet, Oral) Active. Diclofenac Sodium (1% Gel, Transdermal) Active. Ezetimibe (10MG  Tablet, Oral) Active. Gabapentin (600MG  Tablet, Oral) Active. MetFORMIN HCl (500MG  Tablet, Oral) Active. Omeprazole (20MG  Capsule DR, Oral) Active. TraZODone HCl (150MG  Tablet, Oral) Active. TiZANidine HCl (4MG  Tablet, Oral) Active. Omega 3-6-9 (Oral) Active. Medications Reconciled  Social History Aimee [name]; 01/05/2018 10:57 AM) Caffeine use Coffee, Tea. No alcohol use No drug use Tobacco use Current some day smoker.  Family History Aimee [name]; 01/05/2018 10:57 AM) Anesthetic complications Mother. Arthritis Mother. Breast Cancer Family Members In General. Colon Cancer Family Members In General. Colon Polyps Mother. Depression Daughter, Mother, Sister. Diabetes Mellitus Father, Mother, Sister. Heart Disease Father. Heart disease in female family member before age 65 Hypertension Father, Mother. Malignant Neoplasm Of Pancreas Family Members In General. Migraine Headache Sister. Rectal Cancer Family Members In General. Respiratory Condition Father.  Pregnancy / Birth History Aimee [name]; 01/05/2018 10:57 AM) Age at menarche 12 years. Gravida 3 Maternal age 12-20 Para 3  Other Problems Aimee Areola M. Andrey Campanile, MD; 01/10/2018 11:41 AM) Anxiety Disorder Arthritis Back Pain Depression Diabetes Mellitus Gastroesophageal Reflux Disease High blood  pressure Hypercholesterolemia Oophorectomy Right. Other disease, cancer, significant illness     Review of Systems Aimee [name] [name]; 01/05/2018 10:57 AM) General Present- Appetite Loss, Fatigue and Night Sweats. Not Present-  Chills, Fever, Weight Gain and Weight Loss. HEENT Present- Earache, Hearing Loss, Sore Throat and Wears glasses/contact lenses. Not Present- Hoarseness, Nose Bleed, Oral Ulcers, Ringing in the Ears, Seasonal Allergies, Sinus Pain, Visual Disturbances and Yellow Eyes. Cardiovascular Present- Leg Cramps. Not Present- Chest Pain, Difficulty Breathing Lying Down, Palpitations, Rapid Heart Rate, Shortness of Breath and Swelling of Extremities. Gastrointestinal Present- Abdominal Pain, Chronic diarrhea and Gets full quickly at meals. Not Present- Bloating, Bloody Stool, Change in Bowel Habits, Constipation, Difficulty Swallowing, Excessive gas, Hemorrhoids, Indigestion, Nausea, Rectal Pain and Vomiting. Female Genitourinary Present- Nocturia and Urgency. Not Present- Frequency, Painful Urination and Pelvic Pain. Musculoskeletal Present- Back Pain, Joint Pain, Joint Stiffness, Muscle Pain and Muscle Weakness. Not Present- Swelling of Extremities. Neurological Present- Numbness, Tingling, Trouble walking and Weakness. Not Present- Decreased Memory, Fainting, Headaches, Seizures and Tremor. Psychiatric Present- Anxiety, Bipolar, Change in Sleep Pattern and Depression. Not Present- Fearful and Frequent crying. Endocrine Not Present- Cold Intolerance, Excessive Hunger, Hair Changes, Heat Intolerance, Hot flashes and New Diabetes. Hematology Not Present- Blood Thinners, Easy Bruising, Excessive bleeding, Gland problems, HIV and Persistent Infections.  Vitals (Aimee [name] [name]; 01/05/2018 10:37 AM) 01/05/2018 10:36 AM Weight: 205.2 lb Height: 68in Body Surface Area: 2.07 m Body Mass Index: 31.2 kg/m  Pulse: 92 (Regular)       Physical Exam Aimee Areola M. [name] MD;  01/05/2018 2:35 PM)  General Mental Status-Alert. General Appearance-Consistent with stated age. Hydration-Well hydrated. Voice-Normal.  Head and Neck Head-normocephalic, atraumatic with no lesions or palpable masses. Trachea-midline. Thyroid Gland Characteristics - normal size and consistency.  Eye Eyeball - Bilateral-Extraocular movements intact. Sclera/Conjunctiva - Bilateral-No scleral icterus.  ENMT Ears -Note:normal ext ears.  Mouth and Throat -Note:lips intact.   Chest and Lung Exam Chest and lung exam reveals -quiet, even and easy respiratory effort with no use of accessory muscles and on auscultation, normal breath sounds, no adventitious sounds and normal vocal resonance. Inspection Chest Wall - Normal. Back - normal.  Breast - Did not examine.  Cardiovascular Cardiovascular examination reveals -normal heart sounds, regular rate and rhythm with no murmurs and normal pedal pulses bilaterally.  Abdomen Inspection Inspection of the abdomen reveals - No Hernias. Skin - Scar - Note: Old abdominoplasty scars. Palpation/Percussion Palpation and Percussion of the abdomen reveal - Soft, Non Tender, No Rebound tenderness, No Rigidity (guarding) and No hepatosplenomegaly. Auscultation Auscultation of the abdomen reveals - Bowel sounds normal.  Peripheral Vascular Upper Extremity Palpation - Pulses bilaterally normal.  Neurologic Neurologic evaluation reveals -alert and oriented x 3 with no impairment of recent or remote memory. Mental Status-Normal.  Neuropsychiatric The patient's mood and affect are described as -normal. Judgment and Insight-insight is appropriate concerning matters relevant to self.  Musculoskeletal Normal Exam - Left-Upper Extremity Strength Normal and Lower Extremity Strength Normal. Normal Exam - Right-Upper Extremity Strength Normal and Lower Extremity Strength Normal.  Lymphatic Head &  Neck  General Head & Neck Lymphatics: Bilateral - Description - Normal. Axillary - Did not examine. Femoral & Inguinal - Did not examine.    Assessment & Plan Aimee Areola M. Caitlan Chauca MD; 01/10/2018 11:41 AM)  POSTPRANDIAL RUQ PAIN (R10.11) Impression: I believe some of the patient's symptoms are consistent with gallbladder disease.  We discussed gallbladder disease. The patient was given Agricultural engineer. We discussed non-operative and operative management. We discussed the signs & symptoms of acute cholecystitis  I discussed laparoscopic cholecystectomy with possible IOC in detail. The patient was given educational material as well as diagrams detailing the procedure. We  discussed the risks and benefits of a laparoscopic cholecystectomy including, but not limited to bleeding, infection, injury to surrounding structures such as the intestine or liver, bile leak, retained gallstones, need to convert to an open procedure, prolonged diarrhea, blood clots such as DVT, common bile duct injury, anesthesia risks, and possible need for additional procedures. We discussed the typical post-operative recovery course. I explained that the likelihood of improvement of their symptoms is fair - good.  The patient has elected to proceed with surgery.  We discussed that when we perform cholecystectomy based on symptoms alone and negative imaging resolution of symptoms is mixed. We discussed some patients report complete resolution, others partial, and others no change in her symptoms. She voiced understanding  Current Plans Pt Education - Pamphlet Given - Laparoscopic Gallbladder Surgery You are being scheduled for surgery- Our schedulers will call you.  You should hear from our office's scheduling department within 5 working days about the location, date, and time of surgery. We try to make accommodations for patient's preferences in scheduling surgery, but sometimes the OR schedule or the surgeon's schedule  prevents [place] from making those accommodations.  If you have not heard from our office [phone]) in 5 working days, call the office and ask for your surgeon's nurse.  If you have other questions about your diagnosis, plan, or surgery, call the office and ask for your surgeon's nurse.   ANA POSITIVE (R76.8) Impression: We have been asked to perform a liver biopsy because of her positive autoimmune labs as well as donning of hepatic steatosis. We discussed specific risks and benefits related to liver biopsy such as nondiagnostic specimen, bleeding, bile leak, injury to surrounding structures. She wishes to proceed with liver biopsy as well   HEPATIC STEATOSIS (K76.0)   Mary Sella. Andrey Campanile, MD, FACS General, Bariatric, & Minimally Invasive Surgery [facility], [place]

## 2018-01-10 NOTE — H&P (View-Only) (Signed)
Aimee [name] Documented: 01/05/2018 10:36 AM Location: [facility] Patient #: [id number] DOB: Jan 11, 1971 Married / Language: [place] / Race: White Female  History of Present Illness Aimee Areola M. Savino Whisenant MD; 01/10/2018 11:38 AM) The patient is a 47 year old female who presents with non-malignant abdominal pain. She is referred by [name], PA she states that she is been having right sided pain for many months. It is mainly after eating. It radiates to her back and causes her to double over. She has also been having diarrhea. It is occasionally associated with nausea as well as bloating. She hasn't really found anything to help with the discomfort when she has it. It will occur with different types of food. She denies any regurgitation. She does take medicine for heartburn but this is different than her typical heartburn. She is also been evaluated with lots of different blood work recently. She was found to have a positive ANA as well as Acton smooth muscle antibody. Her hepatitis panel was negative. Her LFTs were normal. She had an abdominal ultrasound which showed no gallstones. She did have some evidence of cholesterol polyps all less than 3 mm. She had evidence of hepatic steatosis on ultrasound as well as CT. She had a nuclear medicine scan which demonstrated a normal ejection fraction however she did complain of abdominal pain with oral intake during the study. She denies any weight loss. She denies any acholic stools. She hasn't found anything that exacerbates her discomfort   Problem List/Past Medical Aimee Areola M. Aimee Campanile, MD; 01/10/2018 11:41 AM) POSTPRANDIAL RUQ PAIN (R10.11) ANA POSITIVE (R76.8) HEPATIC STEATOSIS (K76.0)  Past Surgical History (Aimee [name], CMA; 01/05/2018 10:57 AM) Breast Biopsy Left. Cesarean Section - Multiple Hysterectomy (not due to cancer) - Partial Shoulder Surgery Left. Spinal Surgery - Neck Tonsillectomy  Diagnostic Studies  History [name], CMA; 01/05/2018 10:57 AM) Colonoscopy never Mammogram within last year  Allergies Aimee [name], CMA; 01/05/2018 10:37 AM) HYDROcodone Bitartrate *CHEMICALS* Percocet *ANALGESICS - OPIOID* TraMADol HCl *ANALGESICS - OPIOID*  Medication History (Aimee [name], CMA; 01/05/2018 10:40 AM) AmLODIPine Besylate (10MG  Tablet, Oral) Active. Amoxicillin (400MG /5ML For Suspension, Oral) Active. Atorvastatin Calcium (40MG  Tablet, Oral) Active. Fenofibrate (160MG  Tablet, Oral) Active. Diclofenac Sodium (1% Gel, Transdermal) Active. Ezetimibe (10MG  Tablet, Oral) Active. Gabapentin (600MG  Tablet, Oral) Active. MetFORMIN HCl (500MG  Tablet, Oral) Active. Omeprazole (20MG  Capsule DR, Oral) Active. TraZODone HCl (150MG  Tablet, Oral) Active. TiZANidine HCl (4MG  Tablet, Oral) Active. Omega 3-6-9 (Oral) Active. Medications Reconciled  Social History [name], CMA; 01/05/2018 10:57 AM) Caffeine use Coffee, Tea. No alcohol use No drug use Tobacco use Current some day smoker.  Family History [name], CMA; 01/05/2018 10:57 AM) Anesthetic complications Mother. Arthritis Mother. Breast Cancer Family Members In General. Colon Cancer Family Members In General. Colon Polyps Mother. Depression Daughter, Mother, Sister. Diabetes Mellitus Father, Mother, Sister. Heart Disease Father. Heart disease in female family member before age 65 Hypertension Father, Mother. Malignant Neoplasm Of Pancreas Family Members In General. Migraine Headache Sister. Rectal Cancer Family Members In General. Respiratory Condition Father.  Pregnancy / Birth History [name], CMA; 01/05/2018 10:57 AM) Age at menarche 12 years. Gravida 3 Maternal age 12-20 Para 3  Other Problems Aimee Areola M. Aimee Campanile, MD; 01/10/2018 11:41 AM) Anxiety Disorder Arthritis Back Pain Depression Diabetes Mellitus Gastroesophageal Reflux Disease High blood  pressure Hypercholesterolemia Oophorectomy Right. Other disease, cancer, significant illness     Review of Systems Aimee [name] CMA; 01/05/2018 10:57 AM) General Present- Appetite Loss, Fatigue and Night Sweats. Not Present-  Chills, Fever, Weight Gain and Weight Loss. HEENT Present- Earache, Hearing Loss, Sore Throat and Wears glasses/contact lenses. Not Present- Hoarseness, Nose Bleed, Oral Ulcers, Ringing in the Ears, Seasonal Allergies, Sinus Pain, Visual Disturbances and Yellow Eyes. Cardiovascular Present- Leg Cramps. Not Present- Chest Pain, Difficulty Breathing Lying Down, Palpitations, Rapid Heart Rate, Shortness of Breath and Swelling of Extremities. Gastrointestinal Present- Abdominal Pain, Chronic diarrhea and Gets full quickly at meals. Not Present- Bloating, Bloody Stool, Change in Bowel Habits, Constipation, Difficulty Swallowing, Excessive gas, Hemorrhoids, Indigestion, Nausea, Rectal Pain and Vomiting. Female Genitourinary Present- Nocturia and Urgency. Not Present- Frequency, Painful Urination and Pelvic Pain. Musculoskeletal Present- Back Pain, Joint Pain, Joint Stiffness, Muscle Pain and Muscle Weakness. Not Present- Swelling of Extremities. Neurological Present- Numbness, Tingling, Trouble walking and Weakness. Not Present- Decreased Memory, Fainting, Headaches, Seizures and Tremor. Psychiatric Present- Anxiety, Bipolar, Change in Sleep Pattern and Depression. Not Present- Fearful and Frequent crying. Endocrine Not Present- Cold Intolerance, Excessive Hunger, Hair Changes, Heat Intolerance, Hot flashes and New Diabetes. Hematology Not Present- Blood Thinners, Easy Bruising, Excessive bleeding, Gland problems, HIV and Persistent Infections.  Vitals (Aimee [name] CMA; 01/05/2018 10:37 AM) 01/05/2018 10:36 AM Weight: 205.2 lb Height: 68in Body Surface Area: 2.07 m Body Mass Index: 31.2 kg/m  Pulse: 92 (Regular)       Physical Exam Aimee Areola M. [name] MD;  01/05/2018 2:35 PM)  General Mental Status-Alert. General Appearance-Consistent with stated age. Hydration-Well hydrated. Voice-Normal.  Head and Neck Head-normocephalic, atraumatic with no lesions or palpable masses. Trachea-midline. Thyroid Gland Characteristics - normal size and consistency.  Eye Eyeball - Bilateral-Extraocular movements intact. Sclera/Conjunctiva - Bilateral-No scleral icterus.  ENMT Ears -Note:normal ext ears.  Mouth and Throat -Note:lips intact.   Chest and Lung Exam Chest and lung exam reveals -quiet, even and easy respiratory effort with no use of accessory muscles and on auscultation, normal breath sounds, no adventitious sounds and normal vocal resonance. Inspection Chest Wall - Normal. Back - normal.  Breast - Did not examine.  Cardiovascular Cardiovascular examination reveals -normal heart sounds, regular rate and rhythm with no murmurs and normal pedal pulses bilaterally.  Abdomen Inspection Inspection of the abdomen reveals - No Hernias. Skin - Scar - Note: Old abdominoplasty scars. Palpation/Percussion Palpation and Percussion of the abdomen reveal - Soft, Non Tender, No Rebound tenderness, No Rigidity (guarding) and No hepatosplenomegaly. Auscultation Auscultation of the abdomen reveals - Bowel sounds normal.  Peripheral Vascular Upper Extremity Palpation - Pulses bilaterally normal.  Neurologic Neurologic evaluation reveals -alert and oriented x 3 with no impairment of recent or remote memory. Mental Status-Normal.  Neuropsychiatric The patient's mood and affect are described as -normal. Judgment and Insight-insight is appropriate concerning matters relevant to self.  Musculoskeletal Normal Exam - Left-Upper Extremity Strength Normal and Lower Extremity Strength Normal. Normal Exam - Right-Upper Extremity Strength Normal and Lower Extremity Strength Normal.  Lymphatic Head &  Neck  General Head & Neck Lymphatics: Bilateral - Description - Normal. Axillary - Did not examine. Femoral & Inguinal - Did not examine.    Assessment & Plan Aimee Areola M. Caitlan Chauca MD; 01/10/2018 11:41 AM)  POSTPRANDIAL RUQ PAIN (R10.11) Impression: I believe some of the patient's symptoms are consistent with gallbladder disease.  We discussed gallbladder disease. The patient was given Agricultural engineer. We discussed non-operative and operative management. We discussed the signs & symptoms of acute cholecystitis  I discussed laparoscopic cholecystectomy with possible IOC in detail. The patient was given educational material as well as diagrams detailing the procedure. We  discussed the risks and benefits of a laparoscopic cholecystectomy including, but not limited to bleeding, infection, injury to surrounding structures such as the intestine or liver, bile leak, retained gallstones, need to convert to an open procedure, prolonged diarrhea, blood clots such as DVT, common bile duct injury, anesthesia risks, and possible need for additional procedures. We discussed the typical post-operative recovery course. I explained that the likelihood of improvement of their symptoms is fair - good.  The patient has elected to proceed with surgery.  We discussed that when we perform cholecystectomy based on symptoms alone and negative imaging resolution of symptoms is mixed. We discussed some patients report complete resolution, others partial, and others no change in her symptoms. She voiced understanding  Current Plans Pt Education - Pamphlet Given - Laparoscopic Gallbladder Surgery You are being scheduled for surgery- Our schedulers will call you.  You should hear from our office's scheduling department within 5 working days about the location, date, and time of surgery. We try to make accommodations for patient's preferences in scheduling surgery, but sometimes the OR schedule or the surgeon's schedule  prevents [place] from making those accommodations.  If you have not heard from our office [phone]) in 5 working days, call the office and ask for your surgeon's nurse.  If you have other questions about your diagnosis, plan, or surgery, call the office and ask for your surgeon's nurse.   ANA POSITIVE (R76.8) Impression: We have been asked to perform a liver biopsy because of her positive autoimmune labs as well as donning of hepatic steatosis. We discussed specific risks and benefits related to liver biopsy such as nondiagnostic specimen, bleeding, bile leak, injury to surrounding structures. She wishes to proceed with liver biopsy as well   HEPATIC STEATOSIS (K76.0)   Mary Sella. Aimee Campanile, MD, FACS General, Bariatric, & Minimally Invasive Surgery [facility], [place]

## 2018-01-11 ENCOUNTER — Encounter (HOSPITAL_COMMUNITY): Payer: Self-pay

## 2018-01-11 ENCOUNTER — Ambulatory Visit (HOSPITAL_COMMUNITY): Payer: BLUE CROSS/BLUE SHIELD | Admitting: Certified Registered Nurse Anesthetist

## 2018-01-11 ENCOUNTER — Telehealth: Payer: Self-pay | Admitting: Physician Assistant

## 2018-01-11 ENCOUNTER — Encounter (HOSPITAL_COMMUNITY)
Admission: RE | Disposition: A | Discharge status: Home / Self Care | Payer: Self-pay | Source: Ambulatory Visit | Attending: General Surgery

## 2018-01-11 ENCOUNTER — Ambulatory Visit (HOSPITAL_COMMUNITY)
Admission: RE | Admit: 2018-01-11 | Discharge: 2018-01-11 | Disposition: A | Discharge status: Home / Self Care | Payer: BLUE CROSS/BLUE SHIELD | Source: Ambulatory Visit | Attending: General Surgery | Admitting: General Surgery

## 2018-01-11 DIAGNOSIS — Z7984 Long term (current) use of oral hypoglycemic drugs: Secondary | ICD-10-CM | POA: Diagnosis not present

## 2018-01-11 DIAGNOSIS — K219 Gastro-esophageal reflux disease without esophagitis: Secondary | ICD-10-CM | POA: Diagnosis not present

## 2018-01-11 DIAGNOSIS — F17200 Nicotine dependence, unspecified, uncomplicated: Secondary | ICD-10-CM | POA: Insufficient documentation

## 2018-01-11 DIAGNOSIS — I10 Essential (primary) hypertension: Secondary | ICD-10-CM | POA: Insufficient documentation

## 2018-01-11 DIAGNOSIS — Z79899 Other long term (current) drug therapy: Secondary | ICD-10-CM | POA: Diagnosis not present

## 2018-01-11 DIAGNOSIS — E119 Type 2 diabetes mellitus without complications: Secondary | ICD-10-CM | POA: Diagnosis not present

## 2018-01-11 DIAGNOSIS — E7800 Pure hypercholesterolemia, unspecified: Secondary | ICD-10-CM | POA: Insufficient documentation

## 2018-01-11 DIAGNOSIS — F419 Anxiety disorder, unspecified: Secondary | ICD-10-CM | POA: Insufficient documentation

## 2018-01-11 DIAGNOSIS — K811 Chronic cholecystitis: Secondary | ICD-10-CM | POA: Diagnosis not present

## 2018-01-11 DIAGNOSIS — F329 Major depressive disorder, single episode, unspecified: Secondary | ICD-10-CM | POA: Insufficient documentation

## 2018-01-11 DIAGNOSIS — R1011 Right upper quadrant pain: Secondary | ICD-10-CM | POA: Diagnosis not present

## 2018-01-11 DIAGNOSIS — K760 Fatty (change of) liver, not elsewhere classified: Secondary | ICD-10-CM | POA: Diagnosis not present

## 2018-01-11 DIAGNOSIS — E1140 Type 2 diabetes mellitus with diabetic neuropathy, unspecified: Secondary | ICD-10-CM | POA: Diagnosis not present

## 2018-01-11 HISTORY — PX: CHOLECYSTECTOMY: SHX55

## 2018-01-11 HISTORY — PX: LIVER BIOPSY: SHX301

## 2018-01-11 LAB — GLUCOSE, CAPILLARY: Glucose-Capillary: 246 mg/dL — ABNORMAL HIGH (ref 65–99)

## 2018-01-11 LAB — TYPE AND SCREEN
ABO/RH(D): A POS
Antibody Screen: NEGATIVE

## 2018-01-11 SURGERY — LAPAROSCOPIC CHOLECYSTECTOMY
Anesthesia: General

## 2018-01-11 MED ORDER — PROPOFOL 10 MG/ML IV BOLUS
INTRAVENOUS | Status: AC
  Filled 2018-01-11: qty 20

## 2018-01-11 MED ORDER — EPHEDRINE 5 MG/ML INJ
INTRAVENOUS | Status: AC
Start: 2018-01-11 — End: ?
  Filled 2018-01-11: qty 10

## 2018-01-11 MED ORDER — HYDROMORPHONE HCL 1 MG/ML IJ SOLN
0.2500 mg | INTRAMUSCULAR | Status: DC | PRN
  Administered 2018-01-11 (×4): 0.25 mg via INTRAVENOUS

## 2018-01-11 MED ORDER — MEPERIDINE HCL 50 MG PO TABS: 50.0000 mg | ORAL_TABLET | Freq: Two times a day (BID) | ORAL | 0 refills | Status: DC | PRN

## 2018-01-11 MED ORDER — LACTATED RINGERS IV SOLN
INTRAVENOUS | Status: DC
Start: 2018-01-11 — End: 2018-01-11
  Administered 2018-01-11 (×2): via INTRAVENOUS

## 2018-01-11 MED ORDER — KETOROLAC TROMETHAMINE 15 MG/ML IJ SOLN
INTRAMUSCULAR | Status: DC | PRN
  Administered 2018-01-11: 15 mg via INTRAVENOUS

## 2018-01-11 MED ORDER — DEXMEDETOMIDINE HCL 200 MCG/2ML IV SOLN
INTRAVENOUS | Status: DC | PRN
  Administered 2018-01-11: 4 ug via INTRAVENOUS

## 2018-01-11 MED ORDER — ROCURONIUM BROMIDE 10 MG/ML (PF) SYRINGE
PREFILLED_SYRINGE | INTRAVENOUS | Status: AC
  Filled 2018-01-11: qty 5

## 2018-01-11 MED ORDER — BUPIVACAINE HCL (PF) 0.5 % IJ SOLN
INTRAMUSCULAR | Status: DC | PRN
  Administered 2018-01-11: 30 mL

## 2018-01-11 MED ORDER — HEMOSTATIC AGENTS (NO CHARGE) OPTIME
TOPICAL | Status: DC | PRN
  Administered 2018-01-11: 1 via TOPICAL

## 2018-01-11 MED ORDER — LACTATED RINGERS IR SOLN
Status: DC | PRN
Start: 2018-01-11 — End: 2018-01-11
  Administered 2018-01-11: 1000 mL

## 2018-01-11 MED ORDER — MIDAZOLAM HCL 2 MG/2ML IJ SOLN
INTRAMUSCULAR | Status: AC
  Administered 2018-01-11: 2 mg
  Filled 2018-01-11: qty 2

## 2018-01-11 MED ORDER — PROPOFOL 10 MG/ML IV BOLUS
INTRAVENOUS | Status: DC | PRN
  Administered 2018-01-11: 200 mg via INTRAVENOUS

## 2018-01-11 MED ORDER — SCOPOLAMINE 1 MG/3DAYS TD PT72
1.0000 | MEDICATED_PATCH | TRANSDERMAL | Status: DC
  Administered 2018-01-11: 1.5 mg via TRANSDERMAL
  Filled 2018-01-11: qty 1

## 2018-01-11 MED ORDER — FENTANYL CITRATE (PF) 250 MCG/5ML IJ SOLN
INTRAMUSCULAR | Status: AC
  Filled 2018-01-11: qty 5

## 2018-01-11 MED ORDER — GABAPENTIN 300 MG PO CAPS
300.0000 mg | ORAL_CAPSULE | ORAL | Status: AC
  Administered 2018-01-11: 300 mg via ORAL
  Filled 2018-01-11: qty 1

## 2018-01-11 MED ORDER — ONDANSETRON HCL 4 MG/2ML IJ SOLN
INTRAMUSCULAR | Status: DC | PRN
  Administered 2018-01-11: 4 mg via INTRAVENOUS

## 2018-01-11 MED ORDER — CEFOTETAN DISODIUM-DEXTROSE 2-2.08 GM-%(50ML) IV SOLR
2.0000 g | INTRAVENOUS | Status: AC
  Administered 2018-01-11: 2 g via INTRAVENOUS
  Filled 2018-01-11: qty 50

## 2018-01-11 MED ORDER — ONDANSETRON HCL 4 MG/2ML IJ SOLN
INTRAMUSCULAR | Status: AC
  Filled 2018-01-11: qty 2

## 2018-01-11 MED ORDER — PROMETHAZINE HCL 25 MG/ML IJ SOLN: 6.2500 mg | INTRAMUSCULAR | Status: DC | PRN

## 2018-01-11 MED ORDER — CHLORHEXIDINE GLUCONATE 4 % EX LIQD: 60.0000 mL | Freq: Once | CUTANEOUS | Status: DC

## 2018-01-11 MED ORDER — EPHEDRINE SULFATE-NACL 50-0.9 MG/10ML-% IV SOSY
PREFILLED_SYRINGE | INTRAVENOUS | Status: DC | PRN
  Administered 2018-01-11: 5 mg via INTRAVENOUS

## 2018-01-11 MED ORDER — ACETAMINOPHEN 500 MG PO TABS
1000.0000 mg | ORAL_TABLET | ORAL | Status: AC
  Administered 2018-01-11: 1000 mg via ORAL
  Filled 2018-01-11: qty 2

## 2018-01-11 MED ORDER — SUGAMMADEX SODIUM 500 MG/5ML IV SOLN
INTRAVENOUS | Status: AC
  Filled 2018-01-11: qty 5

## 2018-01-11 MED ORDER — MIDAZOLAM HCL 5 MG/5ML IJ SOLN
INTRAMUSCULAR | Status: DC | PRN
  Administered 2018-01-11: 2 mg via INTRAVENOUS

## 2018-01-11 MED ORDER — DEXAMETHASONE SODIUM PHOSPHATE 10 MG/ML IJ SOLN
INTRAMUSCULAR | Status: AC
  Filled 2018-01-11: qty 1

## 2018-01-11 MED ORDER — SUGAMMADEX SODIUM 200 MG/2ML IV SOLN
INTRAVENOUS | Status: DC | PRN
  Administered 2018-01-11: 375 mg via INTRAVENOUS

## 2018-01-11 MED ORDER — LIDOCAINE 2% (20 MG/ML) 5 ML SYRINGE
INTRAMUSCULAR | Status: AC
  Filled 2018-01-11: qty 5

## 2018-01-11 MED ORDER — ROCURONIUM BROMIDE 50 MG/5ML IV SOSY
PREFILLED_SYRINGE | INTRAVENOUS | Status: DC | PRN
  Administered 2018-01-11: 40 mg via INTRAVENOUS
  Administered 2018-01-11: 10 mg via INTRAVENOUS
  Administered 2018-01-11: 20 mg via INTRAVENOUS

## 2018-01-11 MED ORDER — DIPHENHYDRAMINE HCL 50 MG/ML IJ SOLN
INTRAMUSCULAR | Status: DC | PRN
  Administered 2018-01-11: 12.5 mg via INTRAVENOUS

## 2018-01-11 MED ORDER — FENTANYL CITRATE (PF) 100 MCG/2ML IJ SOLN
INTRAMUSCULAR | Status: DC | PRN
  Administered 2018-01-11 (×5): 50 ug via INTRAVENOUS

## 2018-01-11 MED ORDER — DEXMEDETOMIDINE HCL IN NACL 200 MCG/50ML IV SOLN
INTRAVENOUS | Status: AC
  Filled 2018-01-11: qty 50

## 2018-01-11 MED ORDER — BUPIVACAINE HCL (PF) 0.5 % IJ SOLN
INTRAMUSCULAR | Status: AC
  Filled 2018-01-11: qty 30

## 2018-01-11 MED ORDER — MIDAZOLAM HCL 2 MG/2ML IJ SOLN
INTRAMUSCULAR | Status: AC
  Filled 2018-01-11: qty 2

## 2018-01-11 MED ORDER — BUPIVACAINE LIPOSOME 1.3 % IJ SUSP
20.0000 mL | Freq: Once | INTRAMUSCULAR | Status: DC
  Filled 2018-01-11: qty 20

## 2018-01-11 MED ORDER — PHENYLEPHRINE 40 MCG/ML (10ML) SYRINGE FOR IV PUSH (FOR BLOOD PRESSURE SUPPORT)
PREFILLED_SYRINGE | INTRAVENOUS | Status: AC
Start: 2018-01-11 — End: ?
  Filled 2018-01-11: qty 10

## 2018-01-11 MED ORDER — LIDOCAINE 2% (20 MG/ML) 5 ML SYRINGE
INTRAMUSCULAR | Status: DC | PRN
  Administered 2018-01-11: 100 mg via INTRAVENOUS

## 2018-01-11 MED ORDER — HYDROMORPHONE HCL 1 MG/ML IJ SOLN
INTRAMUSCULAR | Status: AC
  Administered 2018-01-11: 0.25 mg via INTRAVENOUS
  Filled 2018-01-11: qty 1

## 2018-01-11 MED ORDER — PHENYLEPHRINE 40 MCG/ML (10ML) SYRINGE FOR IV PUSH (FOR BLOOD PRESSURE SUPPORT)
PREFILLED_SYRINGE | INTRAVENOUS | Status: DC | PRN
  Administered 2018-01-11 (×3): 80 ug via INTRAVENOUS

## 2018-01-11 MED ORDER — DEXAMETHASONE SODIUM PHOSPHATE 10 MG/ML IJ SOLN
INTRAMUSCULAR | Status: DC | PRN
  Administered 2018-01-11: 10 mg via INTRAVENOUS

## 2018-01-11 SURGICAL SUPPLY — 50 items
APPLICATOR ARISTA FLEXITIP XL (MISCELLANEOUS) IMPLANT
APPLIER CLIP 5 13 M/L LIGAMAX5 (MISCELLANEOUS)
APPLIER CLIP ROT 10 11.4 M/L (STAPLE)
BANDAGE ADH SHEER 1  50/CT (GAUZE/BANDAGES/DRESSINGS) ×12 IMPLANT
BENZOIN TINCTURE PRP APPL 2/3 (GAUZE/BANDAGES/DRESSINGS) ×3 IMPLANT
CABLE HIGH FREQUENCY MONO STRZ (ELECTRODE) ×3 IMPLANT
CHLORAPREP W/TINT 26ML (MISCELLANEOUS) ×3 IMPLANT
CLIP APPLIE 5 13 M/L LIGAMAX5 (MISCELLANEOUS) IMPLANT
CLIP APPLIE ROT 10 11.4 M/L (STAPLE) IMPLANT
CLIP VESOCCLUDE LG 6/CT (CLIP) ×3 IMPLANT
CLIP VESOLOCK MED LG 6/CT (CLIP) ×3 IMPLANT
CLOSURE WOUND 1/2 X4 (GAUZE/BANDAGES/DRESSINGS) ×1
COVER MAYO STAND STRL (DRAPES) IMPLANT
COVER SURGICAL LIGHT HANDLE (MISCELLANEOUS) ×3 IMPLANT
DECANTER SPIKE VIAL GLASS SM (MISCELLANEOUS) IMPLANT
DERMABOND ADVANCED (GAUZE/BANDAGES/DRESSINGS)
DERMABOND ADVANCED .7 DNX12 (GAUZE/BANDAGES/DRESSINGS) IMPLANT
DRAPE C-ARM 42X120 X-RAY (DRAPES) IMPLANT
DRSG TEGADERM 2-3/8X2-3/4 SM (GAUZE/BANDAGES/DRESSINGS) IMPLANT
ELECT PENCIL ROCKER SW 15FT (MISCELLANEOUS) IMPLANT
ELECT REM PT RETURN 15FT ADLT (MISCELLANEOUS) ×3 IMPLANT
GAUZE SPONGE 2X2 8PLY STRL LF (GAUZE/BANDAGES/DRESSINGS) IMPLANT
GLOVE BIO SURGEON STRL SZ7.5 (GLOVE) ×3 IMPLANT
GLOVE INDICATOR 8.0 STRL GRN (GLOVE) ×3 IMPLANT
GOWN STRL REUS W/TWL XL LVL3 (GOWN DISPOSABLE) ×3 IMPLANT
GRASPER SUT TROCAR 14GX15 (MISCELLANEOUS) ×3 IMPLANT
HEMOSTAT ARISTA ABSORB 3G PWDR (MISCELLANEOUS) IMPLANT
HEMOSTAT SNOW SURGICEL 2X4 (HEMOSTASIS) ×3 IMPLANT
KIT BASIN OR (CUSTOM PROCEDURE TRAY) ×3 IMPLANT
L-HOOK LAP DISP 36CM (ELECTROSURGICAL)
LHOOK LAP DISP 36CM (ELECTROSURGICAL) IMPLANT
NEEDLE BIOPSY 14GX4.5 SOFT TIS (NEEDLE) ×3 IMPLANT
POUCH RETRIEVAL ECOSAC 10 (ENDOMECHANICALS) ×1 IMPLANT
POUCH RETRIEVAL ECOSAC 10MM (ENDOMECHANICALS) ×2
SCISSORS LAP 5X35 DISP (ENDOMECHANICALS) ×3 IMPLANT
SET CHOLANGIOGRAPH MIX (MISCELLANEOUS) IMPLANT
SET IRRIG TUBING LAPAROSCOPIC (IRRIGATION / IRRIGATOR) ×3 IMPLANT
SLEEVE XCEL OPT CAN 5 100 (ENDOMECHANICALS) ×6 IMPLANT
SPONGE GAUZE 2X2 STER 10/PKG (GAUZE/BANDAGES/DRESSINGS)
STRIP CLOSURE SKIN 1/2X4 (GAUZE/BANDAGES/DRESSINGS) ×2 IMPLANT
SUT MNCRL AB 4-0 PS2 18 (SUTURE) ×3 IMPLANT
SUT VICRYL 0 TIES 12 18 (SUTURE) IMPLANT
SUT VICRYL 0 UR6 27IN ABS (SUTURE) ×3 IMPLANT
TOWEL OR 17X26 10 PK STRL BLUE (TOWEL DISPOSABLE) ×3 IMPLANT
TOWEL OR NON WOVEN STRL DISP B (DISPOSABLE) IMPLANT
TRAY LAPAROSCOPIC (CUSTOM PROCEDURE TRAY) ×3 IMPLANT
TROCAR BLADELESS OPT 5 100 (ENDOMECHANICALS) ×3 IMPLANT
TROCAR XCEL BLUNT TIP 100MML (ENDOMECHANICALS) ×3 IMPLANT
TROCAR XCEL NON-BLD 11X100MML (ENDOMECHANICALS) IMPLANT
TUBING INSUF HEATED (TUBING) ×3 IMPLANT

## 2018-01-11 NOTE — Interval H&P Note (Signed)
History and Physical Interval Note:  01/11/2018 10:49 AM  Aimee [name]  has presented today for surgery, with the diagnosis of RUQ pain  The various methods of treatment have been discussed with the patient and family. After consideration of risks, benefits and other options for treatment, the patient has consented to  Procedure(s) with comments: LAPAROSCOPIC CHOLECYSTECTOMY (N/A) - PT HAS PTSD AND IS AGGREGATED BY WAITING FOR LONG PERIODS OF TIME. LIVER BIOPSY (N/A) as a surgical intervention .  The patient's history has been reviewed, patient examined, no change in status, stable for surgery.  I have reviewed the patient's chart and labs.  Questions were answered to the patient's satisfaction.    [name]. [name], MD, FACS General, Bariatric, & Minimally Invasive Surgery [facility] [place]   [name]

## 2018-01-11 NOTE — Anesthesia Preprocedure Evaluation (Addendum)
Anesthesia Evaluation  Patient identified by MRN, date of birth, ID band Patient awake    Reviewed: Allergy & Precautions, NPO status , Patient's Chart, lab work & pertinent test results  Airway Mallampati: III  TM Distance: >3 FB Neck ROM: Full    Dental no notable dental hx.    Pulmonary Current Smoker,    Pulmonary exam normal breath sounds clear to auscultation       Cardiovascular hypertension, Pt. on medications Normal cardiovascular exam Rhythm:Regular Rate:Normal  ECG: NSR, rate 79   Neuro/Psych PSYCHIATRIC DISORDERS Anxiety Depression PTSD Neuromuscular disease    GI/Hepatic Neg liver ROS, GERD  Medicated and Controlled,  Endo/Other  diabetes, Oral Hypoglycemic Agents  Renal/GU negative Renal ROS     Musculoskeletal  (+) Fibromyalgia -  Abdominal (+) + obese,   Peds  Hematology HLD   Anesthesia Other Findings RUQ pain  Reproductive/Obstetrics                            Anesthesia Physical Anesthesia Plan  ASA: III  Anesthesia Plan: General   Post-op Pain Management:    Induction: Intravenous  PONV Risk Score and Plan: 3 and Midazolam, Scopolamine patch - Pre-op, Dexamethasone, Ondansetron and Treatment may vary due to age or medical condition  Airway Management Planned: Oral ETT and Video Laryngoscope Planned  Additional Equipment:   Intra-op Plan:   Post-operative Plan: Extubation in OR  Informed Consent: I have reviewed the patients History and Physical, chart, labs and discussed the procedure including the risks, benefits and alternatives for the proposed anesthesia with the patient or authorized representative who has indicated his/her understanding and acceptance.   Dental advisory given  Plan Discussed with: CRNA  Anesthesia Plan Comments:        Anesthesia Quick Evaluation

## 2018-01-11 NOTE — Op Note (Signed)
Aimee [name] [id number] Jan 29, 1971 01/11/2018  Laparoscopic Cholecystectomy and Liver biopsy Procedure Note  Indications: This patient presents with symptomatic right upper quadrant pain and will undergo laparoscopic cholecystectomy.  She also has hepatic steatosis and we were asked to perform a liver biopsy during her cholecystectomy as well.  Please see outside medical record for additional details regarding the patient's history  Pre-operative Diagnosis: Abdominal pain, right upper quadrant; hepatic steatosis  Post-operative Diagnosis: Same  Surgeon: [name] MD FACS  Assistants: none  Anesthesia: General endotracheal anesthesia  Procedure Details  The patient was seen again in the Holding Room. The risks, benefits, complications, treatment options, and expected outcomes were discussed with the patient. The possibilities of reaction to medication, pulmonary aspiration, perforation of viscus, bleeding, recurrent infection, finding a normal gallbladder, the need for additional procedures, failure to diagnose a condition, the possible need to convert to an open procedure, and creating a complication requiring transfusion or operation were discussed with the patient. The likelihood of improving the patient's symptoms with return to their baseline status is good.  The patient and/or family concurred with the proposed plan, giving informed consent. The site of surgery properly noted. The patient was taken to Operating Room, identified as Aimee [name] and the procedure verified as Laparoscopic Cholecystectomy & liver biopsy. A Time Out was held and the above information confirmed. Antibiotic prophylaxis was administered.   Prior to the induction of general anesthesia, antibiotic prophylaxis was administered. General endotracheal anesthesia was then administered and tolerated well. After the induction, the abdomen was prepped with Chloraprep and draped in the sterile fashion. The patient was  positioned in the supine position.  The patient had had a prior abdominal hysterectomy and abdominoplasty.  Local anesthetic agent was injected into the skin near the umbilicus and an incision made. We dissected down to the abdominal fascia with blunt dissection.  The fascia was incised vertically.  I was down to the peritoneum and incised it with a pair of Metzenbaum scissors however it did not appear that I was in the abdominal cavity just yet.  Because of her prior lower midline incision and abdominoplasty I decided at this point to switch to an Optiview technique.  After making a small half centimeter incision in the left upper quadrant just below the left subcostal margin in the midclavicular line, I used a 0 degree 5 mm laparoscope through a 5 mm trocar and advanced it through all layers of the abdominal cavity and entered the abdomen.  Pneumoperitoneum was established without any changes and patient vital signs.  The abdominal cavity was surveilled.  There is no evidence of injury to surrounding structures in the left upper quadrant.  I visualize the supraumbilical location.  I had not come through the peritoneum just yet   In that location.  There was some omental adhesions in the lower midline which were left alone.  I was then able to advance the [facility] trocar through the supraumbilical incision into the abdominal cavity under direct visualization.  A pursestring suture of 0-Vicryl was placed around the fascial opening.  The Hasson cannula wassecured with the stay suture.   Two 5-mm ports were placed in the right upper quadrant. All skin incisions were infiltrated with a local anesthetic agent before making the incision and placing the trocars.   We positioned the patient in reverse Trendelenburg, tilted slightly to the patient's left.  The gallbladder was identified, the fundus grasped and retracted cephalad. Adhesions were lysed bluntly and with the electrocautery where  indicated, taking care not to  injure any adjacent organs or viscus. The infundibulum was grasped and retracted laterally, exposing the peritoneum overlying the triangle of Calot. This was then divided and exposed in a blunt fashion. A large critical view of the cystic duct and cystic artery was obtained.  The cystic duct was clearly identified and bluntly dissected circumferentially.  The cystic duct was then ligated with hemoclips and divided. The cystic artery which had been identified & dissected free was ligated with clips and divided as well.   The gallbladder was dissected from the liver bed in retrograde fashion with the electrocautery.  There was a little bit of spillage of bile from the gallbladder.  The gallbladder was removed and placed in an Ecco sac.  The gallbladder and Ecco sac were then removed through the umbilical port site. The liver bed was irrigated and inspected. Hemostasis was achieved with the electrocautery. Copious irrigation was utilized and was repeatedly aspirated until clear.    I then went about performing a liver biopsy.  A small stab incision was made in the right upper quadrant.  The Tru-Cut biopsy needle was then advanced through the small stab incision into the right lobe of the liver.  I made 3 separate passes into the right lobe of the liver.  I did not get a lot of tissue.  However I did not feel comfortable doing any additional passes.  The biopsy sites were cauterized.  The sites were irrigated.  There is no evidence of bleeding or bile leak.  I did place a piece of Ethicon surgical snow over the biopsy sites.  The pursestring suture was used to close the umbilical fascia.  An additional interrupted 0 Vicryl was placed at the umbilical fascia using the PMI suture passer with laparoscopic assistance.  Additional local was infiltrated in this location.  We again inspected the right upper quadrant for hemostasis.  The umbilical closure was inspected and there was no air leak and nothing trapped  within the closure. Pneumoperitoneum was released as we removed the trocars.  4-0 Monocryl was used to close the skin.   Benzoin, steri-strips, and clean dressings were applied. The patient was then extubated and brought to the recovery room in stable condition. Instrument, sponge, and needle counts were correct at closure and at the conclusion of the case.   Findings: +critical view; mild hepatic steatosis  Estimated Blood Loss: Minimal         Drains: none         Specimens: Gallbladder           Complications: None; patient tolerated the procedure well.         Disposition: PACU - hemodynamically stable.         Condition: stable  [name]. [name], MD, FACS General, Bariatric, & Minimally Invasive Surgery [facility], [place]

## 2018-01-11 NOTE — Anesthesia Procedure Notes (Signed)
Procedure Name: Intubation Date/Time: 01/11/2018 11:23 AM Performed by: West Pugh, CRNA Pre-anesthesia Checklist: Patient identified, Emergency Drugs available, Suction available, Patient being monitored and Timeout performed Patient Re-evaluated:Patient Re-evaluated prior to induction Oxygen Delivery Method: Circle system utilized Preoxygenation: Pre-oxygenation with 100% oxygen Induction Type: IV induction Ventilation: Mask ventilation without difficulty Laryngoscope Size: 3 and Glidescope (Elective glidescope utilized.) Grade View: Grade I Tube type: Oral Tube size: 7.0 mm Number of attempts: 1 Airway Equipment and Method: Video-laryngoscopy and Rigid stylet Placement Confirmation: ETT inserted through vocal cords under direct vision,  positive ETCO2,  CO2 detector and breath sounds checked- equal and bilateral Secured at: 22 cm Tube secured with: Tape Dental Injury: Teeth and Oropharynx as per pre-operative assessment

## 2018-01-11 NOTE — Progress Notes (Signed)
Versed 48m iv given at 0924.  Pt on monitor.  Stayed at bedside for over 20 minutes.  Pt awake, alert, never went to sleep.  Vss, pt's mother is at bedside.  Left door open.  Call bell within reach.

## 2018-01-11 NOTE — Telephone Encounter (Signed)
Please see the surgical note. Dr [name] did try to biopsy but not a lot of tissue was obtained. I do not know if the specimen was adequate. It does show a liver biopsy on the surgical pathology requisition. Thanks.

## 2018-01-11 NOTE — Transfer of Care (Signed)
Immediate Anesthesia Transfer of Care Note  Patient: Aimee [name]  Procedure(s) Performed: LAPAROSCOPIC CHOLECYSTECTOMY (N/A ) LIVER BIOPSY (N/A )  Patient Location: PACU  Anesthesia Type:General  Level of Consciousness: drowsy and patient cooperative  Airway & Oxygen Therapy: Patient Spontanous Breathing and Patient connected to face mask oxygen  Post-op Assessment: Report given to RN and Post -op Vital signs reviewed and stable  Post vital signs: Reviewed and stable  Last Vitals:  Vitals Value Taken Time  BP 115/84 01/11/2018  1:00 PM  Temp    Pulse 91 01/11/2018  1:02 PM  Resp 20 01/11/2018  1:02 PM  SpO2 100 % 01/11/2018  1:02 PM  Vitals shown include unvalidated device data.  Last Pain:  Vitals:   01/11/18 0813  TempSrc:   PainSc: 3       Patients Stated Pain Goal: 2 (01/19/70 2524)  Complications: No apparent anesthesia complications

## 2018-01-11 NOTE — Anesthesia Postprocedure Evaluation (Signed)
Anesthesia Post Note  Patient: Aimee [name]  Procedure(s) Performed: LAPAROSCOPIC CHOLECYSTECTOMY (N/A ) LIVER BIOPSY (N/A )     Patient location during evaluation: PACU Anesthesia Type: General Level of consciousness: awake and alert Pain management: pain level controlled Vital Signs Assessment: post-procedure vital signs reviewed and stable Respiratory status: spontaneous breathing, nonlabored ventilation, respiratory function stable and patient connected to nasal cannula oxygen Cardiovascular status: blood pressure returned to baseline and stable Postop Assessment: no apparent nausea or vomiting Anesthetic complications: no    Last Vitals:  Vitals:   01/11/18 1345 01/11/18 1415  BP: 110/77 118/84  Pulse: 90 88  Resp: 18 16  Temp:  36.7 C  SpO2: 98% 98%    Last Pain:  Vitals:   01/11/18 1415  TempSrc:   PainSc: 8                  Ryan P Ellender

## 2018-01-11 NOTE — Discharge Instructions (Signed)
[facility] LAPAROSCOPIC SURGERY: POST OP INSTRUCTIONS Always review your discharge instruction sheet given to you by the facility where your surgery was performed. IF YOU HAVE DISABILITY OR FAMILY LEAVE FORMS, YOU MUST BRING THEM TO THE OFFICE FOR PROCESSING.   DO NOT GIVE THEM TO YOUR DOCTOR.  PAIN CONTROL  1. First take acetaminophen (Tylenol) AND/or ibuprofen (Advil) to control your pain after surgery.  Follow directions on package.  Taking acetaminophen (Tylenol) and/or ibuprofen (Advil) regularly after surgery will help to control your pain and lower the amount of prescription pain medication you may need.  You should not take more than 4,000 mg (4 grams) of acetaminophen (Tylenol) in 24 hours.  You should not take ibuprofen (Advil), aleve, motrin, naprosyn or other NSAIDS if you have a history of stomach ulcers or chronic kidney disease.  2. A prescription for pain medication may be given to you upon discharge.  Take your pain medication as prescribed, if you still have uncontrolled pain after taking acetaminophen (Tylenol) or ibuprofen (Advil). 3. Use ice packs to help control pain. 4. If you need a refill on your pain medication, please contact your pharmacy.  They will contact our office to request authorization. Prescriptions will not be filled after 5pm or on week-ends.  HOME MEDICATIONS 5. Take your usually prescribed medications unless otherwise directed.  DIET 6. You should follow a light diet the first few days after arrival home.  Be sure to include lots of fluids daily. Avoid fatty, fried foods.   CONSTIPATION 7. It is common to experience some constipation after surgery and if you are taking pain medication.  Increasing fluid intake and taking a stool softener (such as Colace) will usually help or prevent this problem from occurring.  A mild laxative (Milk of Magnesia or Miralax) should be taken according to package instructions if there are no bowel  movements after 48 hours.  WOUND/INCISION CARE 8. Most patients will experience some swelling and bruising in the area of the incisions.  Ice packs will help.  Swelling and bruising can take several days to resolve.  9. Unless discharge instructions indicate otherwise, follow guidelines below  a. STERI-STRIPS - you may remove your outer bandages 48 hours after surgery, and you may shower at that time.  You have steri-strips (small skin tapes) in place directly over the incision.  These strips should be left on the skin for 7-10 days.   b. DERMABOND/SKIN GLUE - you may shower in 24 hours.  The glue will flake off over the next 2-3 weeks. 10. Any sutures or staples will be removed at the office during your follow-up visit.  ACTIVITIES 11. You may resume regular (light) daily activities beginning the next day--such as daily self-care, walking, climbing stairs--gradually increasing activities as tolerated.  You may have sexual intercourse when it is comfortable.  Refrain from any heavy lifting or straining until approved by your doctor. a. You may drive when you are no longer taking prescription pain medication, you can comfortably wear a seatbelt, and you can safely maneuver your car and apply brakes.  FOLLOW-UP 12. You should see your doctor in the office for a follow-up appointment approximately 2-3 weeks after your surgery.  You should have been given your post-op/follow-up appointment when your surgery was scheduled.  If you did not receive a post-op/follow-up appointment, make sure that you call for this appointment within a day or two after you arrive home to insure a convenient appointment time.  OTHER  INSTRUCTIONS 13.   WHEN TO CALL YOUR DOCTOR: 1. Fever over 101.0 2. Inability to urinate 3. Continued bleeding from incision. 4. Increased pain, redness, or drainage from the incision. 5. Increasing abdominal pain  The clinic staff is available to answer your questions during regular  business hours.  Please dont hesitate to call and ask to speak to one of the nurses for clinical concerns.  If you have a medical emergency, go to the nearest emergency room or call 911.  A surgeon from [facility] is always on call at the hospital. [address], [place], [place], [place]  [address] ? [address] [place], [place], [place]   [address] [phone] ? [phone] ? FAX [phone] Web site: [email or web address]

## 2018-01-12 NOTE — Telephone Encounter (Signed)
Will need to review the results of bx - which may take some days to result. Please schedule her a follow up with Dr Fuller Plan to discuss, and help determine if any further workup needed.

## 2018-01-14 ENCOUNTER — Encounter: Payer: Self-pay | Admitting: Family Medicine

## 2018-01-15 ENCOUNTER — Encounter: Payer: Self-pay | Admitting: Physician Assistant

## 2018-01-15 NOTE — Telephone Encounter (Signed)
Yes , that is fine - schedule  In a couple weeks - her path report is still pending

## 2018-01-15 NOTE — Telephone Encounter (Signed)
Amy, the openings for Dr Fuller Plan start in the last week of June. Do you want me to schedule her with you again?

## 2018-01-16 NOTE — Telephone Encounter (Signed)
Patient calling to speak with Eustaquio Maize stating that she spoke with Dr.Willson and wanted to let her know.

## 2018-01-16 NOTE — Telephone Encounter (Signed)
Contacted and scheduled for 01/29/18.

## 2018-01-18 DIAGNOSIS — K760 Fatty (change of) liver, not elsewhere classified: Secondary | ICD-10-CM | POA: Diagnosis not present

## 2018-01-25 ENCOUNTER — Encounter: Payer: Self-pay | Admitting: Family Medicine

## 2018-01-29 ENCOUNTER — Telehealth: Payer: Self-pay | Admitting: *Deleted

## 2018-01-29 ENCOUNTER — Encounter: Payer: Self-pay | Admitting: Physician Assistant

## 2018-01-29 ENCOUNTER — Ambulatory Visit: Payer: BLUE CROSS/BLUE SHIELD | Admitting: Physician Assistant

## 2018-01-29 ENCOUNTER — Telehealth: Payer: Self-pay

## 2018-01-29 ENCOUNTER — Other Ambulatory Visit (INDEPENDENT_AMBULATORY_CARE_PROVIDER_SITE_OTHER): Payer: BLUE CROSS/BLUE SHIELD

## 2018-01-29 VITALS — BP 120/80 | HR 90 | Ht 68.0 in | Wt 210.4 lb

## 2018-01-29 DIAGNOSIS — K760 Fatty (change of) liver, not elsewhere classified: Secondary | ICD-10-CM

## 2018-01-29 DIAGNOSIS — R945 Abnormal results of liver function studies: Secondary | ICD-10-CM

## 2018-01-29 DIAGNOSIS — R7989 Other specified abnormal findings of blood chemistry: Secondary | ICD-10-CM

## 2018-01-29 LAB — HEPATIC FUNCTION PANEL
ALBUMIN: 3.9 g/dL (ref 3.5–5.2)
ALK PHOS: 48 U/L (ref 39–117)
ALT: 33 U/L (ref 0–35)
AST: 17 U/L (ref 0–37)
BILIRUBIN DIRECT: 0.1 mg/dL (ref 0.0–0.3)
TOTAL PROTEIN: 6.9 g/dL (ref 6.0–8.3)
Total Bilirubin: 0.4 mg/dL (ref 0.2–1.2)

## 2018-01-29 LAB — CBC WITH DIFFERENTIAL/PLATELET
BASOS ABS: 0.1 10*3/uL (ref 0.0–0.1)
Basophils Relative: 0.9 % (ref 0.0–3.0)
EOS PCT: 3.6 % (ref 0.0–5.0)
Eosinophils Absolute: 0.3 10*3/uL (ref 0.0–0.7)
HCT: 41.4 % (ref 36.0–46.0)
Hemoglobin: 14 g/dL (ref 12.0–15.0)
LYMPHS ABS: 2.9 10*3/uL (ref 0.7–4.0)
Lymphocytes Relative: 41.6 % (ref 12.0–46.0)
MCHC: 33.8 g/dL (ref 30.0–36.0)
MCV: 95.5 fl (ref 78.0–100.0)
MONO ABS: 0.3 10*3/uL (ref 0.1–1.0)
Monocytes Relative: 4.3 % (ref 3.0–12.0)
NEUTROS ABS: 3.5 10*3/uL (ref 1.4–7.7)
NEUTROS PCT: 49.6 % (ref 43.0–77.0)
PLATELETS: 246 10*3/uL (ref 150.0–400.0)
RBC: 4.33 Mil/uL (ref 3.87–5.11)
RDW: 13.5 % (ref 11.5–15.5)
WBC: 7 10*3/uL (ref 4.0–10.5)

## 2018-01-29 LAB — PROTIME-INR
INR: 0.9 ratio (ref 0.8–1.0)
Prothrombin Time: 11 s (ref 9.6–13.1)

## 2018-01-29 NOTE — Patient Instructions (Signed)
You will be called with a date for the Ultrasound with biopsies.  The radiologist will review the note from today and advise the scheduler about the appointment.   We did request an appointment within the next 2 weeks.   After the Ultrasound you will be called with the results.   If you are age 47 or younger, your body mass index should be between 19-25. Your Body mass index is 31.99 kg/m. If this is out of the aformentioned range listed, please consider follow up with your Primary Care Provider.

## 2018-01-29 NOTE — Telephone Encounter (Signed)
-----   Message from Alfredia Ferguson, PA-C sent at 01/29/2018  1:36 PM EDT ----- Please let pt know today's labs are all normal , liver enzymes normal ,CBC normal, clotting test normal

## 2018-01-29 NOTE — Telephone Encounter (Signed)
Called the patient to ask if [facility] Radiology department  called her yet with the Liver biopsy Ultrasound date.  She said they did call her and it is  02-12-2018.  She has the time to arrive @ 10:45 am.  She thanked me for checking on it.

## 2018-01-29 NOTE — Progress Notes (Signed)
Reviewed and agree with management plan.  Bernd Crom T. Aki Abalos, MD FACG 

## 2018-01-29 NOTE — Telephone Encounter (Signed)
I left her a voicemail that all labs are normal.

## 2018-01-29 NOTE — Progress Notes (Signed)
Subjective:    Patient ID: Aimee [name], female    DOB: 1971-07-08, 47 y.o.   MRN: [id number]  HPI Aimee [name] is a pleasant 47 year old white female recently established, and initially evaluated in March 2019 for complaints of upper abdominal pain merrily right-sided and associated bloating.  She had undergone upper abdominal ultrasound which did show multiple gallstones, also several cholesterol polyps, CBD of 4 mm and changes of fatty liver. She comes in today for follow-up after recent laparoscopic cholecystectomy and liver biopsy. As part of her work-up for fatty liver she had serologies done which were negative for hepatitis A, B, and C, ceruloplasmin ferritin alpha-1 antitrypsin all within normal limits.  IgG 990, mitochondrial antibody less than 20 smooth muscle antibody was slightly positive and ANA was positive, speckled 1-1 60. LFTs have been normal. Liver biopsy was requested at the time of her laparoscopic cholecystectomy and this was done per Dr. Andrey Campanile.  Unfortunately the surgical path showed scant liver parenchyma insufficient for diagnosis.  The iron stain was negative. Patient's other medical problems include hypertension, adult onset diabetes mellitus, degenerative disc disease, PTSD, obesity, and fibromyalgia. She is upset that liver biopsy was not adequate to make a diagnosis.  She is doing okay postoperatively and has surgical follow-up later this week.  She is still complaining of right upper quadrant pain that radiates around into her back.  No worsening since surgery.  Appetite has been okay, no nausea or vomiting.  Review of Systems Pertinent positive and negative review of systems were noted in the above HPI section.  All other review of systems was otherwise negative.  Outpatient Encounter Medications as of 01/29/2018  Medication Sig  . amLODipine (NORVASC) 10 MG tablet Take 1 tablet (10 mg total) by mouth daily. (Patient taking differently: Take 10 mg by mouth at bedtime.  )  . atorvastatin (LIPITOR) 40 MG tablet Take 1 tablet (40 mg total) by mouth daily. (Patient taking differently: Take 40 mg by mouth at bedtime. )  . blood glucose meter kit and supplies KIT Dispense based on patient and insurance preference. Use up to four times daily as directed. (FOR ICD-9 250.00, 250.01).  . Blood Glucose Monitoring Suppl (BLOOD GLUCOSE MONITOR SYSTEM) w/Device KIT 1 Device by Does not apply route 2 (two) times daily.  . citalopram (CELEXA) 40 MG tablet TAKE 1 TABLETS (40 MG TOTAL) BY MOUTH DAILY. (Patient taking differently: Take 40 mg by mouth at bedtime. )  . cyanocobalamin (,VITAMIN B-12,) 1000 MCG/ML injection Inject 1 mL (1,000 mcg total) into the muscle every 30 (thirty) days.  . diclofenac sodium (VOLTAREN) 1 % GEL Apply 4 g topically 4 (four) times daily as needed (neck and back pain).  Marland Kitchen ezetimibe (ZETIA) 10 MG tablet Take 1 tablet (10 mg total) by mouth daily. (Patient taking differently: Take 10 mg by mouth at bedtime. )  . fenofibrate 160 MG tablet Take 1 tablet (160 mg total) by mouth daily. For cholesterol and triglyceride (Patient taking differently: Take 160 mg by mouth at bedtime. For cholesterol and triglyceride)  . fluconazole (DIFLUCAN) 150 MG tablet Take 150 mg by mouth daily as needed. For yeast infection.  . gabapentin (NEURONTIN) 600 MG tablet TAKE 1 TABLET EVERY MORNING , 2 TABS IN THE AFTERNOON, AND TAKE 3 TABLETS AT BEDTIME (Patient taking differently: Take 600 mg by mouth at bedtime. )  . glucose blood test strip Use as instructed  . Lancets MISC Use to check blood sugars twice daily  . metFORMIN (GLUCOPHAGE)  500 MG tablet Take 1 tablet (500 mg total) by mouth daily with breakfast. (Patient taking differently: Take 500 mg by mouth at bedtime. )  . mometasone (ELOCON) 0.1 % lotion APPLY TOPICALLY DAILY. (Patient taking differently: Apply 1 application topically daily as needed (for skin irritation (ears)). )  . omega-3 acid ethyl esters (LOVAZA) 1 g  capsule TAKE 2 CAPSULE 2 TIMES A DAY  . omeprazole (PRILOSEC) 20 MG capsule Take 1 capsule (20 mg total) by mouth daily. (Patient taking differently: Take 20 mg by mouth at bedtime. )  . tiZANidine (ZANAFLEX) 4 MG tablet Take 1.5 tablets (6 mg total) by mouth every 6 (six) hours as needed for muscle spasms.  . traZODone (DESYREL) 150 MG tablet Use from 1/3 to 1 tablet nightly as needed for sleep. (Patient taking differently: Take 50-150 mg by mouth at bedtime as needed for sleep. )  . [DISCONTINUED] meperidine (DEMEROL) 50 MG tablet Take 1 tablet (50 mg total) by mouth 2 (two) times daily as needed. (Patient not taking: Reported on 01/29/2018)   No facility-administered encounter medications on file as of 01/29/2018.    Allergies  Allergen Reactions  . Hydrocodone Itching and Other (See Comments)    And family of drugs   . Oxycodone Itching  . Tramadol Itching  . Mirtazapine Other (See Comments)    xs hunger, foot tapping idiosynchronously  . Percocet [Oxycodone-Acetaminophen]   . Darvocet [Propoxyphene N-Acetaminophen] Itching   Patient Active Problem List   Diagnosis Date Noted  . Ganglion cyst of finger 09/13/2017  . Status post placement of bone anchored hearing aid (BAHA) 08/14/2017  . Fibromyalgia 11/29/2016  . Lumbar radiculopathy 11/21/2016  . Essential hypertension 04/22/2016  . Severe obesity (BMI >= 40) (HCC) 10/08/2015  . PTSD (post-traumatic stress disorder) 09/10/2015  . Trapezius strain 08/19/2015  . Depression 08/19/2015  . Hypertriglyceridemia 11/12/2014  . Peripheral neuropathy 11/12/2014  . DDD (degenerative disc disease), lumbar 11/12/2014  . Diabetes (HCC) 03/05/2013  . Smoking 03/05/2013   Social History   Socioeconomic History  . Marital status: Married    Spouse name: Genevie Cheshire  . Number of children: 3  . Years of education: 18  . Highest education level: Not on file  Occupational History    Comment: [employer]  Social Needs  . Financial resource  strain: Not on file  . Food insecurity:    Worry: Not on file    Inability: Not on file  . Transportation needs:    Medical: Not on file    Non-medical: Not on file  Tobacco Use  . Smoking status: Current Some Day Smoker    Packs/day: 0.25    Years: 23.00    Pack years: 5.75    Types: Cigarettes  . Smokeless tobacco: Never Used  Substance and Sexual Activity  . Alcohol use: No    Alcohol/week: 0.0 oz  . Drug use: No  . Sexual activity: Yes    Partners: Male  Lifestyle  . Physical activity:    Days per week: Not on file    Minutes per session: Not on file  . Stress: Not on file  Relationships  . Social connections:    Talks on phone: Not on file    Gets together: Not on file    Attends religious service: Not on file    Active member of club or organization: Not on file    Attends meetings of clubs or organizations: Not on file    Relationship status: Not on  file  . Intimate partner violence:    Fear of current or ex partner: Not on file    Emotionally abused: Not on file    Physically abused: Not on file    Forced sexual activity: Not on file  Other Topics Concern  . Not on file  Social History Narrative   Lives with spouse at home   Caffeine use- coffee, 1-2 cups daily    Ms. Dragos's family history includes Bone cancer in her cousin; Breast cancer in her maternal aunt; Colon cancer in her maternal grandmother; Diabetes in her father; GI problems in her mother; Heart attack in her maternal grandfather, paternal grandfather, paternal grandmother, and paternal uncle; Liver cancer in her maternal aunt; Lymphoma in her paternal uncle.      Objective:    Vitals:   01/29/18 1056  BP: 120/80  Pulse: 90  SpO2: 96%    Physical Exam; well-developed white female in no acute distress, pleasant blood pressure 120/80 pulse 90 height 5 foot 8, weight 210, BMI 31.9.  HEENT; nontraumatic normocephalic EOMI PERRLA sclera anicteric, oropharynx clear, Cardiovascular ;regular  rate and rhythm with S1-S2 no murmur rub or gallop, Pulmonary ;clear bilaterally, Abdomen; obese, soft nondistended she has some mild tenderness laterally in the right upper quadrant and around into the right back, no guarding or rebound incisional ports are healing, bowel sounds present no palpable mass or hepatosplenomegaly.  Rectal; exam not done, Extremities ;no clubbing cyanosis or edema skin warm and dry, Neuro psych; alert and oriented, grossly nonfocal mood and affect appropriate       Assessment & Plan:   #63 47 year old white female status post laparoscopic cholecystectomy 01/11/2018, done for right upper quadrant pain, and ultrasound showing multiple gallstones and several cholesterol polyps.  She has had normal LFTs.  CBD 4 mm on ultrasound, no IOC done.  Path showed chronic cholecystitis  #2 fatty liver by ultrasound-serologic work-up revealed positive ANA, speckled 1-1 60 and slightly positive smooth muscle antibody.  Rule out underlying autoimmune liver disease versus nonalcoholic fatty liver disease Intraoperative liver biopsy was done but tissue insufficient for diagnosis  #3 hypertension #4.  Adult onset diabetes mellitus #5.  Obesity #6.  Fibromyalgia #7.  History of PTSD   #8 history of the degenerative disc disease  Plan; CBC with differential, hepatic panel, pro time and INR. Etiology of patient's ongoing right upper quadrant pain is not clear, suspect some of this may be musculoskeletal  Long discussion with patient today regarding rationale for liver biopsy, and indication to rule out autoimmune liver disease versus nonalcoholic fatty liver disease.  She voices understanding.  She wants to have a definite diagnosis, and is agreeable to repeat liver biopsy via radiology with ultrasound-guided biopsy.  This will be scheduled, and office follow-up thereafter.   Wilmina Maxham S Josette Shimabukuro PA-C 01/29/2018   Cc: Mechele Claude, MD

## 2018-02-08 DIAGNOSIS — H6531 Chronic mucoid otitis media, right ear: Secondary | ICD-10-CM | POA: Diagnosis not present

## 2018-02-09 ENCOUNTER — Other Ambulatory Visit: Payer: Self-pay | Admitting: Radiology

## 2018-02-12 ENCOUNTER — Encounter (HOSPITAL_COMMUNITY): Payer: Self-pay

## 2018-02-12 ENCOUNTER — Ambulatory Visit (HOSPITAL_COMMUNITY)
Admission: RE | Admit: 2018-02-12 | Discharge: 2018-02-12 | Disposition: A | Discharge status: Home / Self Care | Payer: BLUE CROSS/BLUE SHIELD | Source: Ambulatory Visit | Attending: Physician Assistant | Admitting: Physician Assistant

## 2018-02-12 DIAGNOSIS — K219 Gastro-esophageal reflux disease without esophagitis: Secondary | ICD-10-CM | POA: Insufficient documentation

## 2018-02-12 DIAGNOSIS — E781 Pure hyperglyceridemia: Secondary | ICD-10-CM | POA: Insufficient documentation

## 2018-02-12 DIAGNOSIS — E1140 Type 2 diabetes mellitus with diabetic neuropathy, unspecified: Secondary | ICD-10-CM | POA: Diagnosis not present

## 2018-02-12 DIAGNOSIS — F329 Major depressive disorder, single episode, unspecified: Secondary | ICD-10-CM | POA: Diagnosis not present

## 2018-02-12 DIAGNOSIS — Z807 Family history of other malignant neoplasms of lymphoid, hematopoietic and related tissues: Secondary | ICD-10-CM | POA: Diagnosis not present

## 2018-02-12 DIAGNOSIS — E669 Obesity, unspecified: Secondary | ICD-10-CM | POA: Insufficient documentation

## 2018-02-12 DIAGNOSIS — Z800 Family history of malignant neoplasm of digestive organs: Secondary | ICD-10-CM | POA: Diagnosis not present

## 2018-02-12 DIAGNOSIS — K760 Fatty (change of) liver, not elsewhere classified: Secondary | ICD-10-CM | POA: Diagnosis present

## 2018-02-12 DIAGNOSIS — Z7984 Long term (current) use of oral hypoglycemic drugs: Secondary | ICD-10-CM | POA: Insufficient documentation

## 2018-02-12 DIAGNOSIS — R7989 Other specified abnormal findings of blood chemistry: Secondary | ICD-10-CM | POA: Insufficient documentation

## 2018-02-12 DIAGNOSIS — K7581 Nonalcoholic steatohepatitis (NASH): Secondary | ICD-10-CM | POA: Insufficient documentation

## 2018-02-12 DIAGNOSIS — I10 Essential (primary) hypertension: Secondary | ICD-10-CM | POA: Insufficient documentation

## 2018-02-12 DIAGNOSIS — M549 Dorsalgia, unspecified: Secondary | ICD-10-CM | POA: Diagnosis not present

## 2018-02-12 DIAGNOSIS — Z90710 Acquired absence of both cervix and uterus: Secondary | ICD-10-CM | POA: Diagnosis not present

## 2018-02-12 DIAGNOSIS — F17210 Nicotine dependence, cigarettes, uncomplicated: Secondary | ICD-10-CM | POA: Insufficient documentation

## 2018-02-12 DIAGNOSIS — Z833 Family history of diabetes mellitus: Secondary | ICD-10-CM | POA: Diagnosis not present

## 2018-02-12 DIAGNOSIS — Z8249 Family history of ischemic heart disease and other diseases of the circulatory system: Secondary | ICD-10-CM | POA: Diagnosis not present

## 2018-02-12 DIAGNOSIS — M797 Fibromyalgia: Secondary | ICD-10-CM | POA: Insufficient documentation

## 2018-02-12 DIAGNOSIS — Z79899 Other long term (current) drug therapy: Secondary | ICD-10-CM | POA: Insufficient documentation

## 2018-02-12 DIAGNOSIS — Z9889 Other specified postprocedural states: Secondary | ICD-10-CM | POA: Insufficient documentation

## 2018-02-12 DIAGNOSIS — Z888 Allergy status to other drugs, medicaments and biological substances status: Secondary | ICD-10-CM | POA: Diagnosis not present

## 2018-02-12 DIAGNOSIS — Z9049 Acquired absence of other specified parts of digestive tract: Secondary | ICD-10-CM | POA: Diagnosis not present

## 2018-02-12 DIAGNOSIS — Z885 Allergy status to narcotic agent status: Secondary | ICD-10-CM | POA: Insufficient documentation

## 2018-02-12 LAB — CBC WITH DIFFERENTIAL/PLATELET
BASOS PCT: 0 %
Basophils Absolute: 0 10*3/uL (ref 0.0–0.1)
EOS ABS: 0.2 10*3/uL (ref 0.0–0.7)
Eosinophils Relative: 3 %
HEMATOCRIT: 41.3 % (ref 36.0–46.0)
HEMOGLOBIN: 14.2 g/dL (ref 12.0–15.0)
Lymphocytes Relative: 44 %
Lymphs Abs: 3.2 10*3/uL (ref 0.7–4.0)
MCH: 32.8 pg (ref 26.0–34.0)
MCHC: 34.4 g/dL (ref 30.0–36.0)
MCV: 95.4 fL (ref 78.0–100.0)
MONOS PCT: 5 %
Monocytes Absolute: 0.4 10*3/uL (ref 0.1–1.0)
NEUTROS ABS: 3.5 10*3/uL (ref 1.7–7.7)
Neutrophils Relative %: 48 %
Platelets: 249 10*3/uL (ref 150–400)
RBC: 4.33 MIL/uL (ref 3.87–5.11)
RDW: 13.2 % (ref 11.5–15.5)
WBC: 7.3 10*3/uL (ref 4.0–10.5)

## 2018-02-12 LAB — COMPREHENSIVE METABOLIC PANEL
ALBUMIN: 4.1 g/dL (ref 3.5–5.0)
ALK PHOS: 54 U/L (ref 38–126)
ALT: 32 U/L (ref 14–54)
ANION GAP: 10 (ref 5–15)
AST: 24 U/L (ref 15–41)
BILIRUBIN TOTAL: 0.4 mg/dL (ref 0.3–1.2)
BUN: 11 mg/dL (ref 6–20)
CALCIUM: 8.8 mg/dL — AB (ref 8.9–10.3)
CO2: 23 mmol/L (ref 22–32)
Chloride: 107 mmol/L (ref 101–111)
Creatinine, Ser: 0.68 mg/dL (ref 0.44–1.00)
GFR calc Af Amer: >60 mL/min (ref >60)
GFR calc non Af Amer: >60 mL/min (ref >60)
GLUCOSE: 111 mg/dL — AB (ref 65–99)
Potassium: 3.7 mmol/L (ref 3.5–5.1)
SODIUM: 140 mmol/L (ref 135–145)
TOTAL PROTEIN: 7.1 g/dL (ref 6.5–8.1)

## 2018-02-12 LAB — PROTIME-INR
INR: 0.92
PROTHROMBIN TIME: 12.3 s (ref 11.4–15.2)

## 2018-02-12 LAB — GLUCOSE, CAPILLARY: Glucose-Capillary: 115 mg/dL — ABNORMAL HIGH (ref 65–99)

## 2018-02-12 MED ORDER — SODIUM CHLORIDE 0.9 % IV SOLN
INTRAVENOUS | Status: DC
  Administered 2018-02-12: 11:00:00 via INTRAVENOUS

## 2018-02-12 MED ORDER — FENTANYL CITRATE (PF) 100 MCG/2ML IJ SOLN
50.0000 ug | Freq: Once | INTRAMUSCULAR | Status: AC
  Administered 2018-02-12: 50 ug via INTRAVENOUS
  Filled 2018-02-12: qty 2

## 2018-02-12 MED ORDER — FENTANYL CITRATE (PF) 100 MCG/2ML IJ SOLN
INTRAMUSCULAR | Status: AC | PRN
  Administered 2018-02-12 (×3): 50 ug via INTRAVENOUS

## 2018-02-12 MED ORDER — MIDAZOLAM HCL 2 MG/2ML IJ SOLN
INTRAMUSCULAR | Status: AC | PRN
  Administered 2018-02-12 (×3): 1 mg via INTRAVENOUS

## 2018-02-12 MED ORDER — GELATIN ABSORBABLE 12-7 MM EX MISC
CUTANEOUS | Status: AC
  Filled 2018-02-12: qty 1

## 2018-02-12 MED ORDER — FENTANYL CITRATE (PF) 100 MCG/2ML IJ SOLN
INTRAMUSCULAR | Status: AC
  Administered 2018-02-12: 50 ug via INTRAVENOUS
  Filled 2018-02-12: qty 4

## 2018-02-12 MED ORDER — LIDOCAINE HCL 1 % IJ SOLN
INTRAMUSCULAR | Status: AC
  Filled 2018-02-12: qty 20

## 2018-02-12 MED ORDER — MIDAZOLAM HCL 2 MG/2ML IJ SOLN
INTRAMUSCULAR | Status: AC
  Filled 2018-02-12: qty 4

## 2018-02-12 MED ORDER — GELATIN ABSORBABLE 12-7 MM EX MISC
CUTANEOUS | Status: AC | PRN
  Administered 2018-02-12: 1 via TOPICAL

## 2018-02-12 NOTE — Discharge Instructions (Signed)
Please keep dressing on for 24 hours. You may bathe in 24 hours.   Liver Biopsy, Care After These instructions give you information on caring for yourself after your procedure. Your doctor may also give you more specific instructions. Call your doctor if you have any problems or questions after your procedure. Follow these instructions at home:  Rest at home for 1-2 days or as told by your doctor.  Have someone stay with you for at least 24 hours.  Do not do these things in the first 24 hours: ? Drive. ? Use machinery. ? Take care of other people. ? Sign legal documents. ? Take a bath or shower.  There are many different ways to close and cover a cut (incision). For example, a cut can be closed with stitches, skin glue, or adhesive strips. Follow your doctor's instructions on: ? Taking care of your cut. ? Changing and removing your bandage (dressing). ? Removing whatever was used to close your cut.  Do not drink alcohol in the first week.  Do not lift more than 5 pounds or play contact sports for the first 2 weeks.  Take medicines only as told by your doctor. For 1 week, do not take medicine that has aspirin in it or medicines like ibuprofen.  Get your test results. Contact a doctor if:  A cut bleeds and leaves more than just a small spot of blood.  A cut is red, puffs up (swells), or hurts more than before.  Fluid or something else comes from a cut.  A cut smells bad.  You have a fever or chills. Get help right away if:  You have swelling, bloating, or pain in your belly (abdomen).  You get dizzy or faint.  You have a rash.  You feel sick to your stomach (nauseous) or throw up (vomit).  You have trouble breathing, feel short of breath, or feel faint.  Your chest hurts.  You have problems talking or seeing.  You have trouble balancing or moving your arms or legs. This information is not intended to replace advice given to you by your health care provider.  Make sure you discuss any questions you have with your health care provider. Document Released: 06/07/2008 Document Revised: 02/04/2016 Document Reviewed: 10/25/2013 Elsevier Interactive Patient Education  2018 [place].      Moderate Conscious Sedation, Adult, Care After These instructions provide you with information about caring for yourself after your procedure. Your health care provider may also give you more specific instructions. Your treatment has been planned according to current medical practices, but problems sometimes occur. Call your health care provider if you have any problems or questions after your procedure. What can I expect after the procedure? After your procedure, it is common:  To feel sleepy for several hours.  To feel clumsy and have poor balance for several hours.  To have poor judgment for several hours.  To vomit if you eat too soon.  Follow these instructions at home: For at least 24 hours after the procedure:   Do not: ? Participate in activities where you could fall or become injured. ? Drive. ? Use heavy machinery. ? Drink alcohol. ? Take sleeping pills or medicines that cause drowsiness. ? Make important decisions or sign legal documents. ? Take care of children on your own.  Rest. Eating and drinking  Follow the diet recommended by your health care provider.  If you vomit: ? Drink water, juice, or soup when you can drink without  vomiting. ? Make sure you have little or no nausea before eating solid foods. General instructions  Have a responsible adult stay with you until you are awake and alert.  Take over-the-counter and prescription medicines only as told by your health care provider.  If you smoke, do not smoke without supervision.  Keep all follow-up visits as told by your health care provider. This is important. Contact a health care provider if:  You keep feeling nauseous or you keep vomiting.  You feel  light-headed.  You develop a rash.  You have a fever. Get help right away if:  You have trouble breathing. This information is not intended to replace advice given to you by your health care provider. Make sure you discuss any questions you have with your health care provider. Document Released: 06/19/2013 Document Revised: 02/01/2016 Document Reviewed: 12/19/2015 Elsevier Interactive Patient Education  [employer].

## 2018-02-12 NOTE — Sedation Documentation (Signed)
Patient is resting comfortably. Talking less at this time. Appears comfortable. Eyes starting to close intermittently and open at times

## 2018-02-12 NOTE — Procedures (Signed)
Interventional Radiology Procedure Note  Procedure: US guided random liver biopsy  Complications: None  Estimated Blood Loss: None  Recommendations: - Bedrest x 3 hrs - DC home  Signed,  Criselda Peaches, MD

## 2018-02-12 NOTE — H&P (Signed)
Chief Complaint: Patient was seen in consultation today for image guided random core liver biopsy  Referring Physician(s): Stark,M  Supervising Physician: Jacqulynn Cadet  Patient Status: [facility] - Out-pt  History of Present Illness: Aimee [name] is a 47 y.o. female smoker with history of diabetes, obesity, fibromyalgia, hypertension, GERD, hyperlipidemia, fatty liver by imaging, positive ANA, slightly positive anti-smooth muscle antibody, and recent laparoscopic cholecystectomy on 01/11/2018 with insufficient liver biopsy.  She presents today for image guided random core liver biopsy for further evaluation.  Past Medical History:  Diagnosis Date  . Arthritis   . Depression   . Diabetes mellitus    type II   . Family history of adverse reaction to anesthesia    mother- mother has lots of alleriges   . GERD (gastroesophageal reflux disease)   . Hypertension   . Hypertriglyceridemia   . Neuromuscular disorder ([place])    diabetic neuropathy   . PONV (postoperative nausea and vomiting)   . PTSD (post-traumatic stress disorder)   . PTSD (post-traumatic stress disorder)     Past Surgical History:  Procedure Laterality Date  . ABDOMINAL HYSTERECTOMY  2010  . ABDOMINOPLASTY  2006  . BREAST EXCISIONAL BIOPSY Right   . [place]   x2  . CHOLECYSTECTOMY N/A 01/11/2018   Procedure: LAPAROSCOPIC CHOLECYSTECTOMY;  Surgeon: [name], MD;  Location: [facility] ORS;  Service: General;  Laterality: N/A;  PT HAS PTSD AND IS AGGREGATED BY WAITING FOR LONG PERIODS OF TIME.  [facility] EAR SURGERY     12 surgeries on R ear starting in 1984; implant 06/2011  . left  carpal tunnel surgery     . LIVER BIOPSY N/A 01/11/2018   Procedure: LIVER BIOPSY;  Surgeon: [name], MD;  Location: [facility] ORS;  Service: General;  Laterality: N/A;  . SHOULDER ARTHROSCOPY Left 10/2015  . THORACIC [place] SURGERY  2011, 2012   x2  . TONSILLECTOMY  12/26/2017  . TUBAL LIGATION       Allergies: Hydrocodone; Oxycodone; Tramadol; Mirtazapine; Percocet [oxycodone-acetaminophen]; and Darvocet [propoxyphene n-acetaminophen]  Medications: Prior to Admission medications   Medication Sig Start Date End Date Taking? Authorizing Provider  amLODipine (NORVASC) 10 MG tablet Take 1 tablet (10 mg total) by mouth daily. Patient taking differently: Take 10 mg by mouth at bedtime.  12/04/17  Yes Claretta Fraise, MD  atorvastatin (LIPITOR) 40 MG tablet Take 1 tablet (40 mg total) by mouth daily. Patient taking differently: Take 40 mg by mouth at bedtime.  12/04/17  Yes Claretta Fraise, MD  blood glucose meter kit and supplies KIT Dispense based on patient and insurance preference. Use up to four times daily as directed. (FOR ICD-9 250.00, 250.01). 11/21/16  Yes Stacks, Cletus Gash, MD  citalopram (CELEXA) 40 MG tablet TAKE 1 TABLETS (40 MG TOTAL) BY MOUTH DAILY. Patient taking differently: Take 40 mg by mouth at bedtime.  12/04/17  Yes Claretta Fraise, MD  cyanocobalamin (,VITAMIN B-12,) 1000 MCG/ML injection Inject 1 mL (1,000 mcg total) into the muscle every 30 (thirty) days. 11/15/17 02/13/18 Yes Stacks, Cletus Gash, MD  diclofenac sodium (VOLTAREN) 1 % GEL Apply 4 g topically 4 (four) times daily as needed (neck and back pain). 12/04/17  Yes Claretta Fraise, MD  ezetimibe (ZETIA) 10 MG tablet Take 1 tablet (10 mg total) by mouth daily. Patient taking differently: Take 10 mg by mouth at bedtime.  12/04/17  Yes Claretta Fraise, MD  fenofibrate 160 MG tablet Take 1 tablet (160 mg total) by mouth daily.  For cholesterol and triglyceride Patient taking differently: Take 160 mg by mouth at bedtime. For cholesterol and triglyceride 12/06/17  Yes Stacks, Cletus Gash, MD  gabapentin (NEURONTIN) 600 MG tablet TAKE 1 TABLET EVERY MORNING , 2 TABS IN THE AFTERNOON, AND TAKE 3 TABLETS AT BEDTIME Patient taking differently: Take 600 mg by mouth at bedtime.  12/04/17  Yes Claretta Fraise, MD  glucose blood test strip Use as  instructed 11/21/17  Yes Claretta Fraise, MD  Lancets MISC Use to check blood sugars twice daily 11/21/17  Yes Stacks, Cletus Gash, MD  metFORMIN (GLUCOPHAGE) 500 MG tablet Take 1 tablet (500 mg total) by mouth daily with breakfast. Patient taking differently: Take 500 mg by mouth at bedtime.  12/04/17  Yes Stacks, Cletus Gash, MD  mometasone (ELOCON) 0.1 % lotion APPLY TOPICALLY DAILY. Patient taking differently: Apply 1 application topically daily as needed (for skin irritation (ears)).  10/06/15  Yes Stacks, Cletus Gash, MD  omega-3 acid ethyl esters (LOVAZA) 1 g capsule TAKE 2 CAPSULE 2 TIMES A DAY 12/04/17  Yes Stacks, Cletus Gash, MD  omeprazole (PRILOSEC) 20 MG capsule Take 1 capsule (20 mg total) by mouth daily. Patient taking differently: Take 20 mg by mouth at bedtime.  12/04/17  Yes Claretta Fraise, MD  tiZANidine (ZANAFLEX) 4 MG tablet Take 1.5 tablets (6 mg total) by mouth every 6 (six) hours as needed for muscle spasms. 11/21/17  Yes Claretta Fraise, MD  traZODone (DESYREL) 150 MG tablet Use from 1/3 to 1 tablet nightly as needed for sleep. Patient taking differently: Take 50-150 mg by mouth at bedtime as needed for sleep.  12/04/17  Yes Stacks, Cletus Gash, MD  Blood Glucose Monitoring Suppl (BLOOD GLUCOSE MONITOR SYSTEM) w/Device KIT 1 Device by Does not apply route 2 (two) times daily. 11/21/17   Claretta Fraise, MD  fluconazole (DIFLUCAN) 150 MG tablet Take 150 mg by mouth daily as needed. For yeast infection. 01/07/18   [provider]     Family History  Problem Relation Age of Onset  . GI problems Mother   . Diabetes Father   . Breast cancer Maternal Aunt        in 34's  . Liver cancer Maternal Aunt   . Colon cancer Maternal Grandmother   . Heart attack Maternal Grandfather   . Heart attack Paternal Grandmother   . Heart attack Paternal Grandfather   . Heart attack Paternal Uncle   . Lymphoma Paternal Uncle   . Bone cancer Cousin     Social History   Socioeconomic History  . Marital status:  Married    Spouse name: Abe People  . Number of children: 3  . Years of education: 72  . Highest education level: Not on file  Occupational History    Comment: [employer]  Social Needs  . Financial resource strain: Not on file  . Food insecurity:    Worry: Not on file    Inability: Not on file  . Transportation needs:    Medical: Not on file    Non-medical: Not on file  Tobacco Use  . Smoking status: Current Some Day Smoker    Packs/day: 0.25    Years: 23.00    Pack years: 5.75    Types: Cigarettes  . Smokeless tobacco: Never Used  Substance and Sexual Activity  . Alcohol use: No    Alcohol/week: 0.0 oz  . Drug use: No  . Sexual activity: Yes    Partners: Male  Lifestyle  . Physical activity:    Days per  week: Not on file    Minutes per session: Not on file  . Stress: Not on file  Relationships  . Social connections:    Talks on phone: Not on file    Gets together: Not on file    Attends religious service: Not on file    Active member of club or organization: Not on file    Attends meetings of clubs or organizations: Not on file    Relationship status: Not on file  Other Topics Concern  . Not on file  Social History Narrative   Lives with spouse at home   Caffeine use- coffee, 1-2 cups daily      Review of Systems he denies fever, headache, chest pain, dyspnea, cough, nausea, vomiting or bleeding.  She does have back pain as well as some right upper quadrant discomfort.  Vital Signs: Blood pressure 125/86, temp 99.8, heart rate 83, respirations 18, O2 sat 97% room air   Physical Exam awake, alert.  Chest clear to auscultation bilaterally.  Heart with regular rate and rhythm.  Abdomen obese, soft, positive bowel sounds, mildly tender right upper quadrant to palpation.  No lower extremity edema.  Imaging: No results found.  Labs:  CBC: Recent Labs    11/14/17 1633 01/09/18 1041 01/29/18 1148 02/12/18 1122  WBC 11.5* 11.4* 7.0 7.3  HGB 14.8 14.6 14.0  14.2  HCT 43.7 43.0 41.4 41.3  PLT 288 317 246.0 249    COAGS: Recent Labs    01/29/18 1148  INR 0.9    BMP: Recent Labs    08/09/17 1718 11/14/17 1633 01/09/18 1041  NA 142 141 139  K 4.4 3.9 3.7  CL 104 101 104  CO2 _0 GLUCOSE 87 87 144*  BUN _1 CALCIUM 9.1 9.5 8.8*  CREATININE 0.65 0.55* 0.54  GFRNONAA 107 112 >60  GFRAA 123 129 >60    LIVER FUNCTION TESTS: Recent Labs    08/09/17 1718 11/14/17 1633 12/08/17 0946 01/29/18 1148  BILITOT 0.3 0.4 0.4 0.4  AST _2 ALT _3 33  ALKPHOS 58 64 78 48  PROT 6.6 7.1 7.3 6.9  ALBUMIN 3.9 4.2 3.9 3.9    TUMOR MARKERS: No results for input(s): AFPTM, CEA, CA199, CHROMGRNA in the last 8760 hours.  Assessment and Plan: 47 y.o. female smoker with history of diabetes, obesity, fibromyalgia, hypertension, GERD, hyperlipidemia, fatty liver by imaging, positive ANA, slightly positive anti-smooth muscle antibody, and recent laparoscopic cholecystectomy on 01/11/2018 with insufficient liver biopsy.  She presents today for image guided random core liver biopsy for further evaluation.Risks and benefits discussed with the patient /mother including, but not limited to bleeding, infection, damage to adjacent structures or low yield requiring additional tests.  All of the patient's questions were answered, patient is agreeable to proceed. Consent signed and in chart.     Thank you for this interesting consult.  I greatly enjoyed meeting Asal Teas and look forward to participating in their care.  A copy of this report was sent to the requesting provider on this date.  Electronically Signed: D. Rowe Robert, PA-C 02/12/2018, 11:42 AM   I spent a total of  25 minutes   in face to face in clinical consultation, greater than 50% of which was counseling/coordinating care for image guided random core liver biopsy

## 2018-02-14 ENCOUNTER — Encounter: Payer: Self-pay | Admitting: Physician Assistant

## 2018-02-15 ENCOUNTER — Telehealth: Payer: Self-pay | Admitting: Physician Assistant

## 2018-02-15 NOTE — Telephone Encounter (Signed)
Spoke with the patient. Stated her results from the biopsy. Did not try to add to the results note. Did not make suggestions on any possibilities. She wanted to know the blood work results from pre-procedure labs. Advised of the exact results. She will keep her follow up appointment. Patient does not seem satisfied with the information I can offer. Wants to have an explanation or diagnosis for her issue of her liver.

## 2018-02-19 DIAGNOSIS — F3113 Bipolar disorder, current episode manic without psychotic features, severe: Secondary | ICD-10-CM | POA: Diagnosis not present

## 2018-02-21 ENCOUNTER — Ambulatory Visit: Payer: BLUE CROSS/BLUE SHIELD | Admitting: Family Medicine

## 2018-02-21 ENCOUNTER — Encounter: Payer: Self-pay | Admitting: Family Medicine

## 2018-02-21 VITALS — BP 94/69 | HR 84 | Temp 98.0°F | Ht 68.0 in | Wt 206.1 lb

## 2018-02-21 DIAGNOSIS — E781 Pure hyperglyceridemia: Secondary | ICD-10-CM | POA: Diagnosis not present

## 2018-02-21 DIAGNOSIS — E1142 Type 2 diabetes mellitus with diabetic polyneuropathy: Secondary | ICD-10-CM | POA: Diagnosis not present

## 2018-02-21 MED ORDER — CITALOPRAM HYDROBROMIDE 40 MG PO TABS: 60.0000 mg | ORAL_TABLET | Freq: Every day | ORAL | 1 refills | Status: DC

## 2018-02-21 MED ORDER — AMLODIPINE BESYLATE 5 MG PO TABS: 5.0000 mg | ORAL_TABLET | Freq: Every day | ORAL | 3 refills | Status: DC

## 2018-02-22 ENCOUNTER — Encounter: Payer: Self-pay | Admitting: Physician Assistant

## 2018-02-22 ENCOUNTER — Ambulatory Visit: Payer: BLUE CROSS/BLUE SHIELD | Admitting: Physician Assistant

## 2018-02-22 VITALS — BP 90/64 | HR 88 | Ht 67.5 in | Wt 205.4 lb

## 2018-02-22 DIAGNOSIS — K760 Fatty (change of) liver, not elsewhere classified: Secondary | ICD-10-CM | POA: Diagnosis not present

## 2018-02-22 DIAGNOSIS — K7581 Nonalcoholic steatohepatitis (NASH): Secondary | ICD-10-CM

## 2018-02-22 LAB — SPECIMEN STATUS

## 2018-02-22 NOTE — Progress Notes (Signed)
Subjective:    Patient ID: Aimee [name], female    DOB: Dec 04, 1970, 47 y.o.   MRN: [id number]  HPI Aimee [name] is a pleasant 47 year old white female, known to Dr. Fuller Plan and myself who has been undergoing work-up for fatty liver.  She was initially seen in March 2019 with complaints of upper abdominal pain, right-sided and abdominal bloating.  Abdominal ultrasound was done which showed multiple gallstones, several cholesterol polyps and changes of fatty liver. She underwent laparoscopic cholecystectomy on 01/11/2018 per Dr. [name], and we had also requested liver biopsy to be done at that time. She has had normal LFTs but other markers done because of finding of fatty liver revealed an elevated ANA 1-1 60 speckled and also had a slightly positive smooth muscle antibody. Unfortunately the specimen on the liver biopsy done at the time of her lap chole was inadequate for diagnosis. She underwent ultrasound-guided liver biopsy on 02/12/2018 and comes in today for follow-up. Fortunately Path from the liver biopsy shows only mild steatohepatitis with minimal perivenular and pericellular fibrosis, no cirrhosis.  No increased iron, no alpha-1 antitrypsin deposits no features of autoimmune hepatitis identified. Patient says she did have some discomfort after her liver biopsy and soreness which has since resolved.  She still gets an occasional "jab of pain' with certain positions or lifting.  Otherwise has been feeling okay.  She has had questions about the liver biopsy, meaning of results and meaning of elevated ANA.  Review of Systems Pertinent positive and negative review of systems were noted in the above HPI section.  All other review of systems was otherwise negative.  Outpatient Encounter Medications as of 02/22/2018  Medication Sig  . amLODipine (NORVASC) 5 MG tablet Take 1 tablet (5 mg total) by mouth daily.  Marland Kitchen atorvastatin (LIPITOR) 40 MG tablet Take 1 tablet (40 mg total) by mouth daily. (Patient  taking differently: Take 40 mg by mouth at bedtime. )  . blood glucose meter kit and supplies KIT Dispense based on patient and insurance preference. Use up to four times daily as directed. (FOR ICD-9 250.00, 250.01).  . Blood Glucose Monitoring Suppl (BLOOD GLUCOSE MONITOR SYSTEM) w/Device KIT 1 Device by Does not apply route 2 (two) times daily.  . citalopram (CELEXA) 40 MG tablet Take 1.5 tablets (60 mg total) by mouth daily.  . diclofenac sodium (VOLTAREN) 1 % GEL Apply 4 g topically 4 (four) times daily as needed (neck and back pain).  Marland Kitchen ezetimibe (ZETIA) 10 MG tablet Take 1 tablet (10 mg total) by mouth daily. (Patient taking differently: Take 10 mg by mouth at bedtime. )  . fenofibrate 160 MG tablet Take 1 tablet (160 mg total) by mouth daily. For cholesterol and triglyceride (Patient taking differently: Take 160 mg by mouth at bedtime. For cholesterol and triglyceride)  . gabapentin (NEURONTIN) 600 MG tablet TAKE 1 TABLET EVERY MORNING , 2 TABS IN THE AFTERNOON, AND TAKE 3 TABLETS AT BEDTIME (Patient taking differently: Take 600 mg by mouth at bedtime. )  . glucose blood test strip Use as instructed  . Lancets MISC Use to check blood sugars twice daily  . metFORMIN (GLUCOPHAGE) 500 MG tablet Take 1 tablet (500 mg total) by mouth daily with breakfast. (Patient taking differently: Take 500 mg by mouth at bedtime. )  . mometasone (ELOCON) 0.1 % lotion APPLY TOPICALLY DAILY. (Patient taking differently: Apply 1 application topically daily as needed (for skin irritation (ears)). )  . omega-3 acid ethyl esters (LOVAZA) 1 g capsule TAKE  2 CAPSULE 2 TIMES A DAY  . omeprazole (PRILOSEC) 20 MG capsule Take 1 capsule (20 mg total) by mouth daily. (Patient taking differently: Take 20 mg by mouth at bedtime. )  . tiZANidine (ZANAFLEX) 4 MG tablet Take 1.5 tablets (6 mg total) by mouth every 6 (six) hours as needed for muscle spasms.  . traZODone (DESYREL) 150 MG tablet Use from 1/3 to 1 tablet nightly as  needed for sleep. (Patient taking differently: Take 50-150 mg by mouth at bedtime as needed for sleep. )   No facility-administered encounter medications on file as of 02/22/2018.    Allergies  Allergen Reactions  . Hydrocodone Itching and Other (See Comments)    And family of drugs   . Oxycodone Itching  . Tramadol Itching  . Mirtazapine Other (See Comments)    xs hunger, foot tapping idiosynchronously  . Percocet [Oxycodone-Acetaminophen]   . Darvocet [Propoxyphene N-Acetaminophen] Itching   Patient Active Problem List   Diagnosis Date Noted  . Ganglion cyst of finger 09/13/2017  . Status post placement of bone anchored hearing aid (BAHA) 08/14/2017  . Fibromyalgia 11/29/2016  . Lumbar radiculopathy 11/21/2016  . Essential hypertension 04/22/2016  . Severe obesity (BMI >= 40) ([place]) 10/08/2015  . PTSD (post-traumatic stress disorder) 09/10/2015  . Trapezius strain 08/19/2015  . Depression 08/19/2015  . Hypertriglyceridemia 11/12/2014  . Peripheral neuropathy 11/12/2014  . DDD (degenerative disc disease), lumbar 11/12/2014  . Varicose veins of leg with pain, bilateral 05/12/2014  . Diabetes ([place]) 03/05/2013  . Smoking 03/05/2013   Social History   Socioeconomic History  . Marital status: Married    Spouse name: Abe People  . Number of children: 3  . Years of education: 53  . Highest education level: Not on file  Occupational History    Comment: [employer]  Social Needs  . Financial resource strain: Not on file  . Food insecurity:    Worry: Not on file    Inability: Not on file  . Transportation needs:    Medical: Not on file    Non-medical: Not on file  Tobacco Use  . Smoking status: Current Some Day Smoker    Packs/day: 0.25    Years: 23.00    Pack years: 5.75    Types: Cigarettes  . Smokeless tobacco: Never Used  Substance and Sexual Activity  . Alcohol use: No    Alcohol/week: 0.0 oz  . Drug use: No  . Sexual activity: Yes    Partners: Male  Lifestyle   . Physical activity:    Days per week: Not on file    Minutes per session: Not on file  . Stress: Not on file  Relationships  . Social connections:    Talks on phone: Not on file    Gets together: Not on file    Attends religious service: Not on file    Active member of club or organization: Not on file    Attends meetings of clubs or organizations: Not on file    Relationship status: Not on file  . Intimate partner violence:    Fear of current or ex partner: Not on file    Emotionally abused: Not on file    Physically abused: Not on file    Forced sexual activity: Not on file  Other Topics Concern  . Not on file  Social History Narrative   Lives with spouse at home   Caffeine use- coffee, 1-2 cups daily    Ms. Riccardi's family history includes  Bone cancer in her cousin; Breast cancer in her maternal aunt; Colon cancer in her maternal grandmother; Diabetes in her father; GI problems in her mother; Heart attack in her maternal grandfather, paternal grandfather, paternal grandmother, and paternal uncle; Liver cancer in her maternal aunt; Lymphoma in her paternal uncle.      Objective:    Vitals:   02/22/18 1424  BP: 90/64  Pulse: 88    Physical Exam; well-developed white female in no acute distress, pleasant, accompanied by 2 of her grandchildren.  Blood pressure 90/64 pulse 88, height 5 foot 7, weight 205, BMI 31.6.  HEENT; nontraumatic normocephalic EOMI PERRLA sclera anicteric, Cardiovascular regular rate and rhythm with S1-S2 no murmur rub or gallop, Pulmonary ;clear bilaterally, Abdomen, soft, she has some mild tenderness in the high right upper quadrant and around laterally over her lower anterior ribs.  Palpable mass or hepatosplenomegaly, no guarding.  Rectal; exam not done, Extremities; no clubbing cyanosis or edema skin warm dry, Neuro psych ;alert and oriented, grossly nonfocal mood and affect appropriate       Assessment & Plan:   #41 47 year old white female with  adult onset diabetes mellitus, hypertension and obesity with recent finding of fatty liver on ultrasound. Work-up including liver biopsy has confirmed mild steatohepatitis, minimal fibrosis, and no evidence for autoimmune liver disease or other inheritable form of liver disease.  #2 status post laparoscopic cholecystectomy 01/11/2018 # 3   Positive ANA, and mildly positive smooth muscle antibody-etiology not clear #4 history of fibromyalgia   Plan; Discussed general management of nonalcoholic fatty liver disease with patient today, including commendations for regular exercise, weight loss of 5 to 7%, avoidance of alcohol. There is some evidence to support use of pioglitazone in diabetic patients.  Patient is currently on metformin.  This can be discussed with her PCP. We will plan to follow-up hepatic panel in 6 months, and check upper abdominal ultrasound with elastography. Regarding positive ANA-and with her history of fibromyalgia and multiple musculoskeletal complaints she may benefit from rheumatology consultation.  She will discuss with her PCP Dr. [name].  Recommended screening colonoscopy age 83.  Amy Genia Harold PA-C 02/22/2018   Cc: Claretta Fraise, MD

## 2018-02-22 NOTE — Patient Instructions (Addendum)
Make a follow up with Nicoletta Ba, PA or Dr. Fuller Plan for December.  Focus on exercise, limiting alcohol and weight loss of 10-15 pounds.

## 2018-02-23 LAB — CMP14+EGFR
ALBUMIN: 4.3 g/dL (ref 3.5–5.5)
ALT: 31 IU/L (ref 0–32)
AST: 21 IU/L (ref 0–40)
Albumin/Globulin Ratio: 1.6 (ref 1.2–2.2)
Alkaline Phosphatase: 59 IU/L (ref 39–117)
BUN / CREAT RATIO: 24 — AB (ref 9–23)
BUN: 16 mg/dL (ref 6–24)
Bilirubin Total: 0.3 mg/dL (ref 0.0–1.2)
CHLORIDE: 104 mmol/L (ref 96–106)
CO2: 24 mmol/L (ref 20–29)
Calcium: 9.5 mg/dL (ref 8.7–10.2)
Creatinine, Ser: 0.68 mg/dL (ref 0.57–1.00)
GFR calc non Af Amer: 105 mL/min/{1.73_m2} (ref >59)
GFR, EST AFRICAN AMERICAN: 120 mL/min/{1.73_m2} (ref >59)
GLUCOSE: 106 mg/dL — AB (ref 65–99)
Globulin, Total: 2.7 g/dL (ref 1.5–4.5)
Potassium: 4.2 mmol/L (ref 3.5–5.2)
Sodium: 142 mmol/L (ref 134–144)
TOTAL PROTEIN: 7 g/dL (ref 6.0–8.5)

## 2018-02-23 LAB — LIPID PANEL
Chol/HDL Ratio: 3.3 ratio (ref 0.0–4.4)
Cholesterol, Total: 126 mg/dL (ref 100–199)
HDL: 38 mg/dL — ABNORMAL LOW (ref >39)
LDL CALC: 53 mg/dL (ref 0–99)
Triglycerides: 174 mg/dL — ABNORMAL HIGH (ref 0–149)
VLDL CHOLESTEROL CAL: 35 mg/dL (ref 5–40)

## 2018-02-25 ENCOUNTER — Encounter: Payer: Self-pay | Admitting: Family Medicine

## 2018-02-25 NOTE — Progress Notes (Signed)
Reviewed and agree with management plan.  Jondavid Schreier T. Calianna Kim, MD FACG 

## 2018-02-25 NOTE — Progress Notes (Signed)
Subjective:  Patient ID: Aimee [name], female    DOB: 08/12/1971  Age: 47 y.o. MRN: [id number]  CC: Medical Management of Chronic Issues   HPI Olla Delancey presents forFollow-up of diabetes. Patient checks blood sugar at home.   90 fasting and 120 postprandial Patient denies symptoms such as polyuria, polydipsia, excessive hunger, nausea No significant hypoglycemic spells noted. Medications reviewed. Pt reports taking them regularly without complication/adverse reaction being reported today.   History Rechel has a past medical history of Arthritis, Depression, Diabetes mellitus, Family history of adverse reaction to anesthesia, GERD (gastroesophageal reflux disease), Hypertension, Hypertriglyceridemia, Neuromuscular disorder ([place]), PONV (postoperative nausea and vomiting), PTSD (post-traumatic stress disorder), and PTSD (post-traumatic stress disorder).   She has a past surgical history that includes Abdominoplasty (2006); Inner ear surgery; Thoracic disc surgery (2011, 2012); Abdominal hysterectomy (2010); Cesarean section (1991, 1995); Shoulder arthroscopy (Left, 10/2015); Breast excisional biopsy (Right); Tubal ligation; left  carpal tunnel surgery ; Tonsillectomy (12/26/2017); Cholecystectomy (N/A, 01/11/2018); and Liver biopsy (N/A, 01/11/2018).   Her family history includes Bone cancer in her cousin; Breast cancer in her maternal aunt; Colon cancer in her maternal grandmother; Diabetes in her father; GI problems in her mother; Heart attack in her maternal grandfather, paternal grandfather, paternal grandmother, and paternal uncle; Liver cancer in her maternal aunt; Lymphoma in her paternal uncle.She reports that she has been smoking cigarettes.  She has a 5.75 pack-year smoking history. She has never used smokeless tobacco. She reports that she does not drink alcohol or use drugs.  Current Outpatient Medications on File Prior to Visit  Medication Sig Dispense Refill  . atorvastatin  (LIPITOR) 40 MG tablet Take 1 tablet (40 mg total) by mouth daily. (Patient taking differently: Take 40 mg by mouth at bedtime. ) 90 tablet 1  . blood glucose meter kit and supplies KIT Dispense based on patient and insurance preference. Use up to four times daily as directed. (FOR ICD-9 250.00, 250.01). 1 each 0  . Blood Glucose Monitoring Suppl (BLOOD GLUCOSE MONITOR SYSTEM) w/Device KIT 1 Device by Does not apply route 2 (two) times daily. 1 each 0  . diclofenac sodium (VOLTAREN) 1 % GEL Apply 4 g topically 4 (four) times daily as needed (neck and back pain). 100 g 1  . ezetimibe (ZETIA) 10 MG tablet Take 1 tablet (10 mg total) by mouth daily. (Patient taking differently: Take 10 mg by mouth at bedtime. ) 90 tablet 1  . fenofibrate 160 MG tablet Take 1 tablet (160 mg total) by mouth daily. For cholesterol and triglyceride (Patient taking differently: Take 160 mg by mouth at bedtime. For cholesterol and triglyceride) 90 tablet 3  . gabapentin (NEURONTIN) 600 MG tablet TAKE 1 TABLET EVERY MORNING , 2 TABS IN THE AFTERNOON, AND TAKE 3 TABLETS AT BEDTIME (Patient taking differently: Take 600 mg by mouth at bedtime. ) 540 tablet 0  . glucose blood test strip Use as instructed 200 each 3  . Lancets MISC Use to check blood sugars twice daily 200 each 3  . metFORMIN (GLUCOPHAGE) 500 MG tablet Take 1 tablet (500 mg total) by mouth daily with breakfast. (Patient taking differently: Take 500 mg by mouth at bedtime. ) 90 tablet 1  . mometasone (ELOCON) 0.1 % lotion APPLY TOPICALLY DAILY. (Patient taking differently: Apply 1 application topically daily as needed (for skin irritation (ears)). ) 180 mL 3  . omega-3 acid ethyl esters (LOVAZA) 1 g capsule TAKE 2 CAPSULE 2 TIMES A DAY 360 capsule 1  .  omeprazole (PRILOSEC) 20 MG capsule Take 1 capsule (20 mg total) by mouth daily. (Patient taking differently: Take 20 mg by mouth at bedtime. ) 90 capsule 1  . tiZANidine (ZANAFLEX) 4 MG tablet Take 1.5 tablets (6 mg  total) by mouth every 6 (six) hours as needed for muscle spasms. 180 tablet 2  . traZODone (DESYREL) 150 MG tablet Use from 1/3 to 1 tablet nightly as needed for sleep. (Patient taking differently: Take 50-150 mg by mouth at bedtime as needed for sleep. ) 90 tablet 1   No current facility-administered medications on file prior to visit.     ROS Review of Systems  Constitutional: Negative.   HENT: Negative for congestion.   Eyes: Negative for visual disturbance.  Respiratory: Negative for shortness of breath.   Cardiovascular: Negative for chest pain.  Gastrointestinal: Negative for abdominal pain, constipation, diarrhea, nausea and vomiting.  Genitourinary: Negative for difficulty urinating.  Musculoskeletal: Negative for arthralgias and myalgias.  Neurological: Negative for headaches.  Psychiatric/Behavioral: Negative for sleep disturbance.    Objective:  BP 94/69   Pulse 84   Temp 98 F (36.7 C) (Oral)   Ht 5' 8"  (1.727 m)   Wt 206 lb 2 oz (93.5 kg)   BMI 31.34 kg/m   BP Readings from Last 3 Encounters:  02/22/18 90/64  02/21/18 94/69  02/12/18 125/86    Wt Readings from Last 3 Encounters:  02/22/18 205 lb 6 oz (93.2 kg)  02/21/18 206 lb 2 oz (93.5 kg)  02/12/18 204 lb (92.5 kg)     Physical Exam  Constitutional: She is oriented to person, place, and time. She appears well-developed and well-nourished. No distress.  HENT:  Head: Normocephalic and atraumatic.  Eyes: Pupils are equal, round, and reactive to light. Conjunctivae are normal.  Neck: Normal range of motion. Neck supple. No thyromegaly present.  Cardiovascular: Normal rate, regular rhythm and normal heart sounds.  No murmur heard. Pulmonary/Chest: Effort normal and breath sounds normal. No respiratory distress. She has no wheezes. She has no rales.  Abdominal: Soft. Bowel sounds are normal. She exhibits no distension. There is no tenderness.  Musculoskeletal: Normal range of motion.  Lymphadenopathy:      She has no cervical adenopathy.  Neurological: She is alert and oriented to person, place, and time.  Skin: Skin is warm and dry.  Psychiatric: She has a normal mood and affect. Her behavior is normal. Judgment and thought content normal.      Assessment & Plan:   Dorotha was seen today for medical management of chronic issues.  Diagnoses and all orders for this visit:  Type 2 diabetes mellitus with diabetic polyneuropathy, without long-term current use of insulin (HCC)  Hypertriglyceridemia -     CMP14+EGFR -     Lipid panel  Other orders -     citalopram (CELEXA) 40 MG tablet; Take 1.5 tablets (60 mg total) by mouth daily. -     amLODipine (NORVASC) 5 MG tablet; Take 1 tablet (5 mg total) by mouth daily. -     Specimen Status      I have discontinued Ashyah Ulrich's amLODipine and fluconazole. I have also changed her citalopram. Additionally, I am having her start on amLODipine. Lastly, I am having her maintain her mometasone, blood glucose meter kit and supplies, tiZANidine, Blood Glucose Monitor System, Lancets, glucose blood, atorvastatin, diclofenac sodium, ezetimibe, gabapentin, metFORMIN, omega-3 acid ethyl esters, omeprazole, traZODone, and fenofibrate.  Meds ordered this encounter  Medications  . citalopram (CELEXA)  40 MG tablet    Sig: Take 1.5 tablets (60 mg total) by mouth daily.    Dispense:  135 tablet    Refill:  1  . amLODipine (NORVASC) 5 MG tablet    Sig: Take 1 tablet (5 mg total) by mouth daily.    Dispense:  90 tablet    Refill:  3     Follow-up: No follow-ups on file.  Claretta Fraise, M.D.

## 2018-02-26 DIAGNOSIS — H90A31 Mixed conductive and sensorineural hearing loss, unilateral, right ear with restricted hearing on the contralateral side: Secondary | ICD-10-CM | POA: Diagnosis not present

## 2018-02-26 DIAGNOSIS — H90A22 Sensorineural hearing loss, unilateral, left ear, with restricted hearing on the contralateral side: Secondary | ICD-10-CM | POA: Diagnosis not present

## 2018-03-01 DIAGNOSIS — F3113 Bipolar disorder, current episode manic without psychotic features, severe: Secondary | ICD-10-CM | POA: Diagnosis not present

## 2018-03-21 DIAGNOSIS — E1142 Type 2 diabetes mellitus with diabetic polyneuropathy: Secondary | ICD-10-CM | POA: Diagnosis not present

## 2018-03-21 DIAGNOSIS — M1990 Unspecified osteoarthritis, unspecified site: Secondary | ICD-10-CM | POA: Diagnosis not present

## 2018-03-21 DIAGNOSIS — M79671 Pain in right foot: Secondary | ICD-10-CM | POA: Diagnosis not present

## 2018-03-21 DIAGNOSIS — M79672 Pain in left foot: Secondary | ICD-10-CM | POA: Diagnosis not present

## 2018-04-04 DIAGNOSIS — M7989 Other specified soft tissue disorders: Secondary | ICD-10-CM | POA: Diagnosis not present

## 2018-04-04 DIAGNOSIS — M791 Myalgia, unspecified site: Secondary | ICD-10-CM | POA: Diagnosis not present

## 2018-04-04 DIAGNOSIS — I7300 Raynaud's syndrome without gangrene: Secondary | ICD-10-CM | POA: Diagnosis not present

## 2018-04-04 DIAGNOSIS — M2550 Pain in unspecified joint: Secondary | ICD-10-CM | POA: Diagnosis not present

## 2018-04-20 ENCOUNTER — Other Ambulatory Visit: Payer: Self-pay

## 2018-05-04 ENCOUNTER — Telehealth: Payer: Self-pay | Admitting: Family Medicine

## 2018-05-04 MED ORDER — GABAPENTIN 600 MG PO TABS: ORAL_TABLET | ORAL | 0 refills | Status: DC

## 2018-05-04 MED ORDER — OMEGA-3-ACID ETHYL ESTERS 1 G PO CAPS: ORAL_CAPSULE | ORAL | 1 refills | Status: DC

## 2018-05-04 MED ORDER — CITALOPRAM HYDROBROMIDE 40 MG PO TABS: 60.0000 mg | ORAL_TABLET | Freq: Every day | ORAL | 1 refills | Status: DC

## 2018-05-04 MED ORDER — FENOFIBRATE 160 MG PO TABS: 160.0000 mg | ORAL_TABLET | Freq: Every day | ORAL | 3 refills | Status: DC

## 2018-05-04 MED ORDER — OMEPRAZOLE 20 MG PO CPDR: 20.0000 mg | DELAYED_RELEASE_CAPSULE | Freq: Every day | ORAL | 0 refills | Status: DC

## 2018-05-04 MED ORDER — EZETIMIBE 10 MG PO TABS: 10.0000 mg | ORAL_TABLET | Freq: Every day | ORAL | 1 refills | Status: DC

## 2018-05-04 MED ORDER — AMLODIPINE BESYLATE 5 MG PO TABS: 5.0000 mg | ORAL_TABLET | Freq: Every day | ORAL | 3 refills | Status: DC

## 2018-05-04 MED ORDER — METFORMIN HCL 500 MG PO TABS: 500.0000 mg | ORAL_TABLET | Freq: Every day | ORAL | 1 refills | Status: DC

## 2018-05-04 MED ORDER — TIZANIDINE HCL 4 MG PO TABS: 6.0000 mg | ORAL_TABLET | Freq: Four times a day (QID) | ORAL | 2 refills | Status: DC | PRN

## 2018-05-04 MED ORDER — ATORVASTATIN CALCIUM 40 MG PO TABS: 40.0000 mg | ORAL_TABLET | Freq: Every day | ORAL | 1 refills | Status: DC

## 2018-05-04 NOTE — Telephone Encounter (Signed)
Pt needs her rx's sent over to new mail order pharmacy that [facility] uses. Rx sent over per pt request.

## 2018-05-07 DIAGNOSIS — Z029 Encounter for administrative examinations, unspecified: Secondary | ICD-10-CM

## 2018-05-09 ENCOUNTER — Ambulatory Visit: Payer: BLUE CROSS/BLUE SHIELD | Admitting: Family

## 2018-05-21 ENCOUNTER — Ambulatory Visit: Payer: BLUE CROSS/BLUE SHIELD | Admitting: Family Medicine

## 2018-05-21 ENCOUNTER — Ambulatory Visit (INDEPENDENT_AMBULATORY_CARE_PROVIDER_SITE_OTHER): Payer: BLUE CROSS/BLUE SHIELD

## 2018-05-21 ENCOUNTER — Encounter: Payer: Self-pay | Admitting: Family Medicine

## 2018-05-21 VITALS — BP 122/83 | HR 82 | Temp 98.6°F | Ht 67.5 in | Wt 209.0 lb

## 2018-05-21 DIAGNOSIS — W101XXA Fall (on)(from) sidewalk curb, initial encounter: Secondary | ICD-10-CM

## 2018-05-21 DIAGNOSIS — M5442 Lumbago with sciatica, left side: Secondary | ICD-10-CM

## 2018-05-21 DIAGNOSIS — L0231 Cutaneous abscess of buttock: Secondary | ICD-10-CM

## 2018-05-21 DIAGNOSIS — I700 Atherosclerosis of aorta: Secondary | ICD-10-CM

## 2018-05-21 DIAGNOSIS — M545 Low back pain: Secondary | ICD-10-CM | POA: Diagnosis not present

## 2018-05-21 MED ORDER — LIDOCAINE 5 % EX PTCH: 1.0000 | MEDICATED_PATCH | CUTANEOUS | 0 refills | Status: DC

## 2018-05-21 MED ORDER — SULFAMETHOXAZOLE-TRIMETHOPRIM 800-160 MG PO TABS: 1.0000 | ORAL_TABLET | Freq: Two times a day (BID) | ORAL | 0 refills | Status: AC

## 2018-05-21 MED ORDER — METHYLPREDNISOLONE ACETATE 80 MG/ML IJ SUSP
80.0000 mg | Freq: Once | INTRAMUSCULAR | Status: AC
  Administered 2018-05-21: 80 mg via INTRAMUSCULAR

## 2018-05-21 NOTE — Progress Notes (Addendum)
Subjective:    Patient ID: Aimee [name], female    DOB: Jun 03, 1971, 47 y.o.   MRN: [id number]  Chief Complaint:  Back pain after a fall and Referral to infectious disease (has another abcess)   HPI: Aimee [name] is a 47 y.o. female presenting on 05/21/2018 for Back pain after a fall and Referral to infectious disease (has another abcess) Pt states she fell off of a curb about 1.5 weeks ago. Pt states she has had constant left lower back pain since the fall. States the pain is sharp/shooting in nature, states 10/10. She reports the pain radiates down her left leg. Pt states the pain is worse with bending, standing, walking, and sitting. Pt states she can get minimal relief with laying on her side. Pt states she has tried Aleve without relief of the pain. No loss of bowel or bladder, no saddle anesthesia, no loss of function or weakness.  Pt also reports an abscess on her left buttock. Pt states this started over a week ago and is constant. Pt states she has had several of these in the past and would like a referral to a infectious disease specialist. Pt states the area has been draining and is very tender to touch. Pt states she has been soaking in epsom salt without relief of the tenderness. Pt denies fever, chills, or malaise.    Relevant past medical, surgical, family and social history reviewed and updated as indicated. Interim medical history since our last visit reviewed. Allergies and medications reviewed and updated. DATA REVIEWED: CHART IN EPIC  Family History reviewed for pertinent findings.  Past Medical History:  Diagnosis Date  . Arthritis   . Depression   . Diabetes mellitus    type II   . Family history of adverse reaction to anesthesia    mother- mother has lots of alleriges   . GERD (gastroesophageal reflux disease)   . Hypertension   . Hypertriglyceridemia   . Neuromuscular disorder ([place])    diabetic neuropathy   . PONV (postoperative nausea and vomiting)   .  PTSD (post-traumatic stress disorder)   . PTSD (post-traumatic stress disorder)     Past Surgical History:  Procedure Laterality Date  . ABDOMINAL HYSTERECTOMY  2010  . ABDOMINOPLASTY  2006  . BREAST EXCISIONAL BIOPSY Right   . Kite   x2  . CHOLECYSTECTOMY N/A 01/11/2018   Procedure: LAPAROSCOPIC CHOLECYSTECTOMY;  Surgeon: [name], MD;  Location: [facility];  Service: General;  Laterality: N/A;  PT HAS PTSD AND IS AGGREGATED BY WAITING FOR LONG PERIODS OF TIME.  [facility]     12 surgeries on R ear starting in 1984; implant 06/2011  . left  carpal tunnel surgery     . LIVER BIOPSY N/A 01/11/2018   Procedure: LIVER BIOPSY;  Surgeon: [name], MD;  Location: [facility];  Service: General;  Laterality: N/A;  . SHOULDER ARTHROSCOPY Left 10/2015  . THORACIC [place] [facility]  2011, 2012   x2  . TONSILLECTOMY  12/26/2017  . TUBAL LIGATION      Social History   Socioeconomic History  . Marital status: Married    Spouse name: Abe People  . Number of children: 3  . Years of education: 78  . Highest education level: Not on file  Occupational History    Comment: [employer]  Social Needs  . Financial resource strain: Not on file  . Food insecurity:    Worry: Not on  file    Inability: Not on file  . Transportation needs:    Medical: Not on file    Non-medical: Not on file  Tobacco Use  . Smoking status: Current Some Day Smoker    Packs/day: 0.25    Years: 23.00    Pack years: 5.75    Types: Cigarettes  . Smokeless tobacco: Never Used  Substance and Sexual Activity  . Alcohol use: No    Alcohol/week: 0.0 standard drinks  . Drug use: No  . Sexual activity: Yes    Partners: Male  Lifestyle  . Physical activity:    Days per week: Not on file    Minutes per session: Not on file  . Stress: Not on file  Relationships  . Social connections:    Talks on phone: Not on file    Gets together: Not on file    Attends religious service: Not on file     Active member of club or organization: Not on file    Attends meetings of clubs or organizations: Not on file    Relationship status: Not on file  . Intimate partner violence:    Fear of current or ex partner: Not on file    Emotionally abused: Not on file    Physically abused: Not on file    Forced sexual activity: Not on file  Other Topics Concern  . Not on file  Social History Narrative   Lives with spouse at home   Caffeine use- coffee, 1-2 cups daily    Allergies as of 05/21/2018      Reactions   Hydrocodone Itching, Other (See Comments)   And family of drugs   Oxycodone Itching   Tramadol Itching   Mirtazapine Other (See Comments)   xs hunger, foot tapping idiosynchronously   Percocet [oxycodone-acetaminophen]    Darvocet [propoxyphene N-acetaminophen] Itching      Medication List        Accurate as of 05/21/18  2:05 PM. Always use your most recent med list.          amLODipine 5 MG tablet Commonly known as:  NORVASC Take 1 tablet (5 mg total) by mouth daily.   atorvastatin 40 MG tablet Commonly known as:  LIPITOR Take 1 tablet (40 mg total) by mouth daily.   blood glucose meter kit and supplies Kit Dispense based on patient and insurance preference. Use up to four times daily as directed. (FOR ICD-9 250.00, 250.01).   Blood Glucose Monitor System w/Device Kit 1 Device by Does not apply route 2 (two) times daily.   citalopram 40 MG tablet Commonly known as:  CELEXA Take 1.5 tablets (60 mg total) by mouth daily.   diclofenac 75 MG EC tablet Commonly known as:  VOLTAREN   diclofenac sodium 1 % Gel Commonly known as:  VOLTAREN Apply 4 g topically 4 (four) times daily as needed (neck and back pain).   ezetimibe 10 MG tablet Commonly known as:  ZETIA Take 1 tablet (10 mg total) by mouth daily.   fenofibrate 160 MG tablet Take 1 tablet (160 mg total) by mouth daily. For cholesterol and triglyceride   gabapentin 600 MG tablet Commonly known as:   NEURONTIN TAKE 1 TABLET EVERY MORNING , 2 TABS IN THE AFTERNOON, AND TAKE 3 TABLETS AT BEDTIME   glucose blood test strip Use as instructed   Lancets Misc Use to check blood sugars twice daily   lidocaine 5 % Commonly known as:  LIDODERM Place 1 patch  onto the skin daily. Remove & Discard patch within 12 hours or as directed by MD   metFORMIN 500 MG tablet Commonly known as:  GLUCOPHAGE Take 1 tablet (500 mg total) by mouth daily with breakfast.   mometasone 0.1 % lotion Commonly known as:  ELOCON APPLY TOPICALLY DAILY.   omega-3 acid ethyl esters 1 g capsule Commonly known as:  LOVAZA TAKE 2 CAPSULE 2 TIMES A DAY   omeprazole 20 MG capsule Commonly known as:  PRILOSEC Take 1 capsule (20 mg total) by mouth daily.   sulfamethoxazole-trimethoprim 800-160 MG tablet Commonly known as:  BACTRIM DS,SEPTRA DS Take 1 tablet by mouth 2 (two) times daily for 7 days.   tiZANidine 4 MG tablet Commonly known as:  ZANAFLEX Take 1.5 tablets (6 mg total) by mouth every 6 (six) hours as needed for muscle spasms.   traZODone 150 MG tablet Commonly known as:  DESYREL Use from 1/3 to 1 tablet nightly as needed for sleep.       Allergies  Allergen Reactions  . Hydrocodone Itching and Other (See Comments)    And family of drugs   . Oxycodone Itching  . Tramadol Itching  . Mirtazapine Other (See Comments)    xs hunger, foot tapping idiosynchronously  . Percocet [Oxycodone-Acetaminophen]   . Darvocet [Propoxyphene N-Acetaminophen] Itching    Review of Systems  Constitutional: Positive for activity change. Negative for fatigue and fever.  HENT: Negative.   Respiratory: Negative.   Cardiovascular: Negative.   Gastrointestinal: Negative.   Genitourinary: Negative.   Musculoskeletal: Positive for back pain.  Skin: Positive for wound.  Neurological: Negative for dizziness and weakness.  Psychiatric/Behavioral: Negative.         Objective:    BP 122/83   Pulse 82   Temp  98.6 F (37 C) (Oral)   Ht 5' 7.5" (1.715 m)   Wt 209 lb (94.8 kg)   BMI 32.25 kg/m    Wt Readings from Last 3 Encounters:  05/21/18 209 lb (94.8 kg)  02/22/18 205 lb 6 oz (93.2 kg)  02/21/18 206 lb 2 oz (93.5 kg)    Physical Exam  Constitutional: She is oriented to person, place, and time. She appears well-developed and well-nourished.  Appears uncomfortable  HENT:  Head: Normocephalic.  Eyes: Pupils are equal, round, and reactive to light.  Neck: Normal range of motion.  Cardiovascular: Normal rate and regular rhythm.  Pulmonary/Chest: Effort normal and breath sounds normal.  Musculoskeletal:       Lumbar back: She exhibits decreased range of motion, tenderness, pain and spasm. She exhibits no edema and no deformity.       Back:  Pain with FROM Positive left straight leg raise test.   Neurological: She is alert and oriented to person, place, and time. She has normal strength and normal reflexes.  Skin: Skin is warm and dry. Capillary refill takes less than 2 seconds. She is not diaphoretic. No pallor.     Psychiatric: She has a normal mood and affect. Her behavior is normal. Judgment and thought content normal.  Nursing note and vitals reviewed.   Lumbar spine x-ray - No obvious fracture. Monia Pouch, FNP-C    Assessment & Plan:   1. Acute left-sided low back pain with left-sided sciatica Back exercises. Continue Voltaren and Tizanadine. Lidoderm patches as prescribed. Referral to Physical Therapy. Will notify pt if radiologist notes any abnormalities on lumbar spine films. - methylPREDNISolone acetate (DEPO-MEDROL) injection 80 mg - DG Lumbar Spine 2-3 Views;  Future  2. Fall (on)(from) sidewalk curb, initial encounter  3. Abscess of buttock, left Warm compresses 3-4 times daily, sitz baths. Do not squeeze or pick at abscess. Referral to Infectious Disease due to recurrent abscesses. - sulfamethoxazole-trimethoprim (BACTRIM DS) 800-160 MG tablet; Take 1 tablet  by mouth 2 (two) times daily for 7 days.  Dispense: 14 tablet; Refill: 0     Follow up plan: Return in about 2 weeks (around 06/04/2018), or if symptoms worsen or fail to improve.  Educational handout given for back pain, sciatica, abscess, sitz bath  The above assessment and management plan was discussed with the patient. The patient verbalized understanding of and has agreed to the management plan. Patient is aware to call the clinic if symptoms persist or worsen. Patient is aware when to return to the clinic for a follow-up visit. Patient educated on when it is appropriate to go to the emergency department.   Monia Pouch, FNP-C [facility] [phone]

## 2018-05-21 NOTE — Patient Instructions (Signed)
How to Take a Sitz Bath A sitz bath is a warm water bath that is taken while you are sitting down. The water should only come up to your hips and should cover your buttocks. Your health care provider may recommend a sitz bath to help you:  Clean the lower part of your body, including your genital area.  With itching.  With pain.  With sore muscles or muscles that tighten or spasm.  How to take a sitz bath Take 3-4 sitz baths per day or as told by your health care provider. 1. Partially fill a bathtub with warm water. You will only need the water to be deep enough to cover your hips and buttocks when you are sitting in it. 2. If your health care provider told you to put medicine in the water, follow the directions exactly. 3. Sit in the water and open the tub drain a little. 4. Turn on the warm water again to keep the tub at the correct level. Keep the water running constantly. 5. Soak in the water for 15-20 minutes or as told by your health care provider. 6. After the sitz bath, pat the affected area dry first. Do not rub it. 7. Be careful when you stand up after the sitz bath because you may feel dizzy.  Contact a health care provider if:  Your symptoms get worse. Do not continue with sitz baths if your symptoms get worse.  You have new symptoms. Do not continue with sitz baths until you talk with your health care provider. This information is not intended to replace advice given to you by your health care provider. Make sure you discuss any questions you have with your health care provider. Document Released: 05/21/2004 Document Revised: 01/27/2016 Document Reviewed: 08/27/2014 Elsevier Interactive Patient Education  2018 [employer]. Skin Abscess A skin abscess is an infected area on or under your skin that contains pus and other material. An abscess can happen almost anywhere on your body. Some abscesses break open (rupture) on their own. Most continue to get worse unless they are  treated. The infection can spread deeper into the body and into your blood, which can make you feel sick. Treatment usually involves draining the abscess. Follow these instructions at home: Abscess Care  If you have an abscess that has not drained, place a warm, clean, wet washcloth over the abscess several times a day. Do this as told by your doctor.  Follow instructions from your doctor about how to take care of your abscess. Make sure you: ? Cover the abscess with a bandage (dressing). ? Change your bandage or gauze as told by your doctor. ? Wash your hands with soap and water before you change the bandage or gauze. If you cannot use soap and water, use hand sanitizer.  Check your abscess every day for signs that the infection is getting worse. Check for: ? More redness, swelling, or pain. ? More fluid or blood. ? Warmth. ? More pus or a bad smell. Medicines   Take over-the-counter and prescription medicines only as told by your doctor.  If you were prescribed an antibiotic medicine, take it as told by your doctor. Do not stop taking the antibiotic even if you start to feel better. General instructions  To avoid spreading the infection: ? Do not share personal care items, towels, or hot tubs with others. ? Avoid making skin-to-skin contact with other people.  Keep all follow-up visits as told by your doctor. This is  important. Contact a doctor if:  You have more redness, swelling, or pain around your abscess.  You have more fluid or blood coming from your abscess.  Your abscess feels warm when you touch it.  You have more pus or a bad smell coming from your abscess.  You have a fever.  Your muscles ache.  You have chills.  You feel sick. Get help right away if:  You have very bad (severe) pain.  You see red streaks on your skin spreading away from the abscess. This information is not intended to replace advice given to you by your health care provider. Make sure  you discuss any questions you have with your health care provider. Document Released: 02/15/2008 Document Revised: 04/24/2016 Document Reviewed: 07/08/2015 Elsevier Interactive Patient Education  2018 [place]. Sciatica Sciatica is pain, numbness, weakness, or tingling along your sciatic nerve. The sciatic nerve starts in the lower back and goes down the back of each leg. Sciatica happens when this nerve is pinched or has pressure put on it. Sciatica usually goes away on its own or with treatment. Sometimes, sciatica may keep coming back (recur). Follow these instructions at home: Medicines  Take over-the-counter and prescription medicines only as told by your doctor.  Do not drive or use heavy machinery while taking prescription pain medicine. Managing pain  If directed, put ice on the affected area. ? Put ice in a plastic bag. ? Place a towel between your skin and the bag. ? Leave the ice on for 20 minutes, 2-3 times a day.  After icing, apply heat to the affected area before you exercise or as often as told by your doctor. Use the heat source that your doctor tells you to use, such as a moist heat pack or a heating pad. ? Place a towel between your skin and the heat source. ? Leave the heat on for 20-30 minutes. ? Remove the heat if your skin turns bright red. This is especially important if you are unable to feel pain, heat, or cold. You may have a greater risk of getting burned. Activity  Return to your normal activities as told by your doctor. Ask your doctor what activities are safe for you. ? Avoid activities that make your sciatica worse.  Take short rests during the day. Rest in a lying or standing position. This is usually better than sitting to rest. ? When you rest for a long time, do some physical activity or stretching between periods of rest. ? Avoid sitting for a long time without moving. Get up and move around at least one time each hour.  Exercise and stretch  regularly, as told by your doctor.  Do not lift anything that is heavier than 10 lb (4.5 kg) while you have symptoms of sciatica. ? Avoid lifting heavy things even when you do not have symptoms. ? Avoid lifting heavy things over and over.  When you lift objects, always lift in a way that is safe for your body. To do this, you should: ? Bend your knees. ? Keep the object close to your body. ? Avoid twisting. General instructions  Use good posture. ? Avoid leaning forward when you are sitting. ? Avoid hunching over when you are standing.  Stay at a healthy weight.  Wear comfortable shoes that support your feet. Avoid wearing high heels.  Avoid sleeping on a mattress that is too soft or too hard. You might have less pain if you sleep on a mattress that  is firm enough to support your back.  Keep all follow-up visits as told by your doctor. This is important. Contact a doctor if:  You have pain that: ? Wakes you up when you are sleeping. ? Gets worse when you lie down. ? Is worse than the pain you have had in the past. ? Lasts longer than 4 weeks.  You lose weight for without trying. Get help right away if:  You cannot control when you pee (urinate) or poop (have a bowel movement).  You have weakness in any of these areas and it gets worse. ? Lower back. ? Lower belly (pelvis). ? Butt (buttocks). ? Legs.  You have redness or swelling of your back.  You have a burning feeling when you pee. This information is not intended to replace advice given to you by your health care provider. Make sure you discuss any questions you have with your health care provider. Document Released: 06/07/2008 Document Revised: 02/04/2016 Document Reviewed: 05/08/2015 Elsevier Interactive Patient Education  2018 [place]. Back Pain, Adult Back pain is very common. The pain often gets better over time. The cause of back pain is usually not dangerous. Most people can learn to manage their back  pain on their own. Follow these instructions at home: Watch your back pain for any changes. The following actions may help to lessen any pain you are feeling:  Stay active. Start with short walks on flat ground if you can. Try to walk farther each day.  Exercise regularly as told by your doctor. Exercise helps your back heal faster. It also helps avoid future injury by keeping your muscles strong and flexible.  Do not sit, drive, or stand in one place for more than 30 minutes.  Do not stay in bed. Resting more than 1-2 days can slow down your recovery.  Be careful when you bend or lift an object. Use good form when lifting: ? Bend at your knees. ? Keep the object close to your body. ? Do not twist.  Sleep on a firm mattress. Lie on your side, and bend your knees. If you lie on your back, put a pillow under your knees.  Take medicines only as told by your doctor.  Put ice on the injured area. ? Put ice in a plastic bag. ? Place a towel between your skin and the bag. ? Leave the ice on for 20 minutes, 2-3 times a day for the first 2-3 days. After that, you can switch between ice and heat packs.  Avoid feeling anxious or stressed. Find good ways to deal with stress, such as exercise.  Maintain a healthy weight. Extra weight puts stress on your back.  Contact a doctor if:  You have pain that does not go away with rest or medicine.  You have worsening pain that goes down into your legs or buttocks.  You have pain that does not get better in one week.  You have pain at night.  You lose weight.  You have a fever or chills. Get help right away if:  You cannot control when you poop (bowel movement) or pee (urinate).  Your arms or legs feel weak.  Your arms or legs lose feeling (numbness).  You feel sick to your stomach (nauseous) or throw up (vomit).  You have belly (abdominal) pain.  You feel like you may pass out (faint). This information is not intended to replace  advice given to you by your health care provider. Make sure you  discuss any questions you have with your health care provider. Document Released: 02/15/2008 Document Revised: 02/04/2016 Document Reviewed: 12/31/2013 Elsevier Interactive Patient Education  [employer].

## 2018-05-22 ENCOUNTER — Telehealth: Payer: Self-pay | Admitting: Family Medicine

## 2018-05-22 ENCOUNTER — Encounter: Payer: Self-pay | Admitting: Family Medicine

## 2018-05-22 DIAGNOSIS — I700 Atherosclerosis of aorta: Secondary | ICD-10-CM | POA: Insufficient documentation

## 2018-05-22 MED ORDER — ASPIRIN EC 81 MG PO TBEC: 81.0000 mg | DELAYED_RELEASE_TABLET | Freq: Every day | ORAL | 1 refills | Status: DC

## 2018-05-22 NOTE — Telephone Encounter (Signed)
No answer, mailbox full

## 2018-05-22 NOTE — Telephone Encounter (Signed)
Returning call. Call house phone house phone first. Please leave message if no answer.

## 2018-05-22 NOTE — Telephone Encounter (Signed)
Patient aware that lidocaine patch is to be placed in affected area for up to 12 out of 24 hours, then removed.

## 2018-05-25 ENCOUNTER — Ambulatory Visit: Payer: BLUE CROSS/BLUE SHIELD | Admitting: Family Medicine

## 2018-06-03 ENCOUNTER — Other Ambulatory Visit: Payer: Self-pay | Admitting: Family Medicine

## 2018-06-03 DIAGNOSIS — L0231 Cutaneous abscess of buttock: Secondary | ICD-10-CM

## 2018-06-04 ENCOUNTER — Ambulatory Visit (INDEPENDENT_AMBULATORY_CARE_PROVIDER_SITE_OTHER): Payer: BLUE CROSS/BLUE SHIELD | Admitting: Internal Medicine

## 2018-06-04 ENCOUNTER — Encounter: Payer: Self-pay | Admitting: Internal Medicine

## 2018-06-04 VITALS — BP 121/86 | HR 96

## 2018-06-04 DIAGNOSIS — L089 Local infection of the skin and subcutaneous tissue, unspecified: Secondary | ICD-10-CM

## 2018-06-04 NOTE — Progress Notes (Signed)
RFV: new referral for recurrent skin abscesses  Patient ID: Aimee [name], female   DOB: [birth year], 47 y.o.   MRN: [id number]  HPI Aimee [name] has been referred to our clinic for recurrent MRSA skin infection. She does have a  Dermatologist - dr Aimee [name] - previously on humira - stopped 3 years ago. Helped some with some arthritis  Had a groin infection roughly 2 years ago.Recently had a boil on cleft of buttock but now improved. drainaged Stopped 2 days ago.   - has had multiple surgery, Loss of hearing in 12 years.    Outpatient Encounter Medications as of 06/04/2018  Medication Sig  . amLODipine (NORVASC) 5 MG tablet Take 1 tablet (5 mg total) by mouth daily.  Marland Kitchen aspirin EC 81 MG tablet Take 1 tablet (81 mg total) by mouth daily.  Marland Kitchen atorvastatin (LIPITOR) 40 MG tablet Take 1 tablet (40 mg total) by mouth daily.  . blood glucose meter kit and supplies KIT Dispense based on patient and insurance preference. Use up to four times daily as directed. (FOR ICD-9 250.00, 250.01).  . Blood Glucose Monitoring Suppl (BLOOD GLUCOSE MONITOR SYSTEM) w/Device KIT 1 Device by Does not apply route 2 (two) times daily.  . citalopram (CELEXA) 40 MG tablet Take 1.5 tablets (60 mg total) by mouth daily.  . diclofenac (VOLTAREN) 75 MG EC tablet   . diclofenac sodium (VOLTAREN) 1 % GEL Apply 4 g topically 4 (four) times daily as needed (neck and back pain).  Marland Kitchen ezetimibe (ZETIA) 10 MG tablet Take 1 tablet (10 mg total) by mouth daily.  . fenofibrate 160 MG tablet Take 1 tablet (160 mg total) by mouth daily. For cholesterol and triglyceride  . gabapentin (NEURONTIN) 600 MG tablet TAKE 1 TABLET EVERY MORNING , 2 TABS IN THE AFTERNOON, AND TAKE 3 TABLETS AT BEDTIME  . glucose blood test strip Use as instructed  . Lancets MISC Use to check blood sugars twice daily  . lidocaine (LIDODERM) 5 % Place 1 patch onto the skin daily. Remove & Discard patch within 12 hours or as directed by MD  . metFORMIN  (GLUCOPHAGE) 500 MG tablet Take 1 tablet (500 mg total) by mouth daily with breakfast.  . mometasone (ELOCON) 0.1 % lotion APPLY TOPICALLY DAILY. (Patient taking differently: Apply 1 application topically daily as needed (for skin irritation (ears)). )  . omega-3 acid ethyl esters (LOVAZA) 1 g capsule TAKE 2 CAPSULE 2 TIMES A DAY  . omeprazole (PRILOSEC) 20 MG capsule Take 1 capsule (20 mg total) by mouth daily.  Marland Kitchen tiZANidine (ZANAFLEX) 4 MG tablet Take 1.5 tablets (6 mg total) by mouth every 6 (six) hours as needed for muscle spasms.  . traZODone (DESYREL) 150 MG tablet Use from 1/3 to 1 tablet nightly as needed for sleep. (Patient taking differently: Take 50-150 mg by mouth at bedtime as needed for sleep. )   No facility-administered encounter medications on file as of 06/04/2018.      Patient Active Problem List   Diagnosis Date Noted  . Aortic atherosclerosis ([place]) 05/22/2018  . Ganglion cyst of finger 09/13/2017  . Status post placement of bone anchored hearing aid (BAHA) 08/14/2017  . Fibromyalgia 11/29/2016  . Lumbar radiculopathy 11/21/2016  . Essential hypertension 04/22/2016  . Severe obesity (BMI >= 40) ([place]) 10/08/2015  . PTSD (post-traumatic stress disorder) 09/10/2015  . Trapezius strain 08/19/2015  . Depression 08/19/2015  . Hypertriglyceridemia 11/12/2014  . Peripheral neuropathy 11/12/2014  . DDD (degenerative disc disease), lumbar 11/12/2014  .  Varicose veins of leg with pain, bilateral 05/12/2014  . Diabetes ([place]) 03/05/2013  . Smoking 03/05/2013     Health Maintenance Due  Topic Date Due  . PNEUMOCOCCAL POLYSACCHARIDE VACCINE AGE 49-64 HIGH RISK  11/03/1972  . FOOT EXAM  11/03/1980  . OPHTHALMOLOGY EXAM  11/03/1980  . HIV Screening  11/03/1985  . TETANUS/TDAP  11/03/1989  . PAP SMEAR  11/04/1991  . URINE MICROALBUMIN  08/18/2016  . INFLUENZA VACCINE  04/12/2018    Social History   Tobacco Use  . Smoking status: Current Some Day Smoker    Packs/day: 0.25     Years: 23.00    Pack years: 5.75    Types: Cigarettes  . Smokeless tobacco: Never Used  Substance Use Topics  . Alcohol use: No    Alcohol/week: 0.0 standard drinks  . Drug use: No   Family History  Problem Relation Age of Onset  . GI problems Mother   . Diabetes Father   . Breast cancer Maternal Aunt        in 80's  . Liver cancer Maternal Aunt   . Colon cancer Maternal Grandmother   . Heart attack Maternal Grandfather   . Heart attack Paternal Grandmother   . Heart attack Paternal Grandfather   . Heart attack Paternal Uncle   . Lymphoma Paternal Uncle   . Bone cancer Cousin     Review of Systems Occasional arhtralgias in areas with boil. 12 point ros is otherwise negative Physical Exam   BP 121/86   Pulse 96   Physical Exam  Constitutional:  oriented to person, place, and time. appears well-developed and well-nourished. No distress.  HENT: [place]/AT, PERRLA, no scleral icterus Mouth/Throat: Oropharynx is clear and moist. No oropharyngeal exudate.  Cardiovascular: Normal rate, regular rhythm and normal heart sounds. Exam reveals no gallop and no friction rub.  No murmur heard.  Pulmonary/Chest: Effort normal and breath sounds normal. No respiratory distress.  has no wheezes.  Buttock = no lesion noted Abdominal: Soft. Bowel sounds are normal.  exhibits no distension. There is no tenderness.  Lymphadenopathy: no cervical adenopathy. No axillary adenopathy Neurological: alert and oriented to person, place, and time.  Skin: Skin is warm and dry. No rash noted. No erythema.  Psychiatric: a normal mood and affect.  behavior is normal.   CBC Lab Results  Component Value Date   WBC WILL FOLLOW 02/21/2018   RBC WILL FOLLOW 02/21/2018   HGB WILL FOLLOW 02/21/2018   HCT WILL FOLLOW 02/21/2018   PLT WILL FOLLOW 02/21/2018   MCV WILL FOLLOW 02/21/2018   MCH WILL FOLLOW 02/21/2018   MCHC WILL FOLLOW 02/21/2018   RDW WILL FOLLOW 02/21/2018   LYMPHSABS WILL FOLLOW  02/21/2018   MONOABS 0.4 02/12/2018   EOSABS WILL FOLLOW 02/21/2018    BMET Lab Results  Component Value Date   NA 142 02/21/2018   K 4.2 02/21/2018   CL 104 02/21/2018   CO2 24 02/21/2018   GLUCOSE 106 (H) 02/21/2018   BUN 16 02/21/2018   CREATININE 0.68 02/21/2018   CALCIUM 9.5 02/21/2018   GFRNONAA 105 02/21/2018   GFRAA 120 02/21/2018      Assessment and Plan  Recurrent mrsa vs. hydradinitis suparativa ? - will screen for mrsa. If negative, would recommend to go to dermatology at next outbreak

## 2018-06-04 NOTE — Patient Instructions (Signed)
If you have another boil that occurs, please see if you can see dr Willette Pa to see if it looks like hydradinitis suparativa

## 2018-06-06 DIAGNOSIS — M9903 Segmental and somatic dysfunction of lumbar region: Secondary | ICD-10-CM | POA: Diagnosis not present

## 2018-06-06 DIAGNOSIS — M9902 Segmental and somatic dysfunction of thoracic region: Secondary | ICD-10-CM | POA: Diagnosis not present

## 2018-06-06 DIAGNOSIS — M9901 Segmental and somatic dysfunction of cervical region: Secondary | ICD-10-CM | POA: Diagnosis not present

## 2018-06-06 DIAGNOSIS — M5431 Sciatica, right side: Secondary | ICD-10-CM | POA: Diagnosis not present

## 2018-06-07 DIAGNOSIS — M9903 Segmental and somatic dysfunction of lumbar region: Secondary | ICD-10-CM | POA: Diagnosis not present

## 2018-06-07 DIAGNOSIS — M9902 Segmental and somatic dysfunction of thoracic region: Secondary | ICD-10-CM | POA: Diagnosis not present

## 2018-06-07 DIAGNOSIS — M9901 Segmental and somatic dysfunction of cervical region: Secondary | ICD-10-CM | POA: Diagnosis not present

## 2018-06-07 DIAGNOSIS — M5431 Sciatica, right side: Secondary | ICD-10-CM | POA: Diagnosis not present

## 2018-06-07 LAB — MRSA CULTURE
MICRO NUMBER:: 91143902
SPECIMEN QUALITY: ADEQUATE

## 2018-06-08 ENCOUNTER — Encounter: Payer: Self-pay | Admitting: Family Medicine

## 2018-06-08 ENCOUNTER — Telehealth: Payer: Self-pay | Admitting: Behavioral Health

## 2018-06-08 ENCOUNTER — Ambulatory Visit (INDEPENDENT_AMBULATORY_CARE_PROVIDER_SITE_OTHER): Payer: BLUE CROSS/BLUE SHIELD | Admitting: Family Medicine

## 2018-06-08 VITALS — BP 109/73 | HR 88 | Temp 98.7°F | Ht 67.5 in | Wt 206.4 lb

## 2018-06-08 DIAGNOSIS — G4701 Insomnia due to medical condition: Secondary | ICD-10-CM

## 2018-06-08 DIAGNOSIS — E781 Pure hyperglyceridemia: Secondary | ICD-10-CM | POA: Diagnosis not present

## 2018-06-08 DIAGNOSIS — Z1321 Encounter for screening for nutritional disorder: Secondary | ICD-10-CM | POA: Diagnosis not present

## 2018-06-08 DIAGNOSIS — E1142 Type 2 diabetes mellitus with diabetic polyneuropathy: Secondary | ICD-10-CM

## 2018-06-08 DIAGNOSIS — R232 Flushing: Secondary | ICD-10-CM | POA: Diagnosis not present

## 2018-06-08 DIAGNOSIS — M5136 Other intervertebral disc degeneration, lumbar region: Secondary | ICD-10-CM

## 2018-06-08 DIAGNOSIS — M5416 Radiculopathy, lumbar region: Secondary | ICD-10-CM

## 2018-06-08 LAB — BAYER DCA HB A1C WAIVED: HB A1C (BAYER DCA - WAIVED): 6.7 % (ref <7.0)

## 2018-06-08 MED ORDER — SUVOREXANT 10 MG PO TABS: 10.0000 mg | ORAL_TABLET | Freq: Every day | ORAL | 2 refills | Status: DC

## 2018-06-08 MED ORDER — GABAPENTIN 600 MG PO TABS: ORAL_TABLET | ORAL | 1 refills | Status: DC

## 2018-06-08 MED ORDER — OMEPRAZOLE 20 MG PO CPDR: 20.0000 mg | DELAYED_RELEASE_CAPSULE | Freq: Every day | ORAL | 1 refills | Status: DC

## 2018-06-08 NOTE — Progress Notes (Signed)
Subjective:  Patient ID: Aimee [name], female    DOB: February 14, 1971  Age: 47 y.o. MRN: [id number]  CC: Medical Management of Chronic Issues (pt here today for routine follow up and also c/o cough for the past week)   HPI Aimee [name] presents forFollow-up of diabetes. Patient checks blood sugar at home.  No log returned. Patient denies symptoms such as polyuria, polydipsia, excessive hunger, nausea No significant hypoglycemic spells noted. Medications reviewed. Pt reports taking them regularly without complication/adverse reaction being reported today.  Pt. Having back pain.  It has been awful for the last 2 weeks.  She started seeing chiropractor this week and plans to start physical therapy next week.  The chiropractor has suggested a laser treatment that she is going to try in a few days.  Meanwhile she has not been to her pain clinic in 3 years and does not want to go there.  She did like to try a different pain clinic but perhaps that something different to offer.  Although he wanted to do it the previous one was to give her pain pills which she does not want.  On the other hand she was here recently and her tizanidine was increased to 4 mg 2 p.o. 4 times daily and she would like to have that increased if possible.  She can no longer take the trazodone because it causes her to wet the bed.  She does have some urinary frequency through the day and does get abnormally to go to the bath bathroom during the night.  She would like to continue her lidocaine patches.  She also would like to see a spine doctor but is concerned about going back to the same one who did her neck.  Patient also is having a great deal of hot flashesThey feel like she is on fire  History Adreanne has a past medical history of Arthritis, Depression, Diabetes mellitus, Family history of adverse reaction to anesthesia, GERD (gastroesophageal reflux disease), Hypertension, Hypertriglyceridemia, Neuromuscular disorder ([place]), PONV  (postoperative nausea and vomiting), PTSD (post-traumatic stress disorder), and PTSD (post-traumatic stress disorder).   She has a past surgical history that includes Abdominoplasty (2006); Inner ear surgery; Thoracic disc surgery (2011, 2012); Abdominal hysterectomy (2010); Cesarean section (1991, 1995); Shoulder arthroscopy (Left, 10/2015); Breast excisional biopsy (Right); Tubal ligation; left  carpal tunnel surgery ; Tonsillectomy (12/26/2017); Cholecystectomy (N/A, 01/11/2018); and Liver biopsy (N/A, 01/11/2018).   Her family history includes Bone cancer in her cousin; Breast cancer in her maternal aunt; Colon cancer in her maternal grandmother; Diabetes in her father; GI problems in her mother; Heart attack in her maternal grandfather, paternal grandfather, paternal grandmother, and paternal uncle; Liver cancer in her maternal aunt; Lymphoma in her paternal uncle.She reports that she has been smoking cigarettes. She has a 5.75 pack-year smoking history. She has never used smokeless tobacco. She reports that she does not drink alcohol or use drugs.  Current Outpatient Medications on File Prior to Visit  Medication Sig Dispense Refill  . amLODipine (NORVASC) 5 MG tablet Take 1 tablet (5 mg total) by mouth daily. 90 tablet 3  . aspirin EC 81 MG tablet Take 1 tablet (81 mg total) by mouth daily. 90 tablet 1  . atorvastatin (LIPITOR) 40 MG tablet Take 1 tablet (40 mg total) by mouth daily. 90 tablet 1  . blood glucose meter kit and supplies KIT Dispense based on patient and insurance preference. Use up to four times daily as directed. (FOR ICD-9 250.00, 250.01). 1 each   Subjective:  Patient ID: Meleane Brow, female    DOB: 05/25/1971  Age: 47 y.o. MRN: [id number]  CC: Medical Management of Chronic Issues (pt here today for routine follow up and also c/o cough for the past week)   HPI Aimee [name] presents forFollow-up of diabetes. Patient checks blood sugar at home.  No log returned. Patient denies symptoms such as polyuria, polydipsia, excessive hunger, nausea No significant hypoglycemic spells noted. Medications reviewed. Pt reports taking them regularly without complication/adverse reaction being reported today.  Pt. Having back pain.  It has been awful for the last 2 weeks.  She started seeing chiropractor this week and plans to start physical therapy next week.  The chiropractor has suggested a laser treatment that she is going to try in a few days.  Meanwhile she has not been to her pain clinic in 3 years and does not want to go there.  She did like to try a different pain clinic but perhaps that something different to offer.  Although he wanted to do it the previous one was to give her pain pills which she does not want.  On the other hand she was here recently and her tizanidine was increased to 4 mg 2 p.o. 4 times daily and she would like to have that increased if possible.  She can no longer take the trazodone because it causes her to wet the bed.  She does have some urinary frequency through the day and does get abnormally to go to the bath bathroom during the night.  She would like to continue her lidocaine patches.  She also would like to see a spine doctor but is concerned about going back to the same one who did her neck.  Patient also is having a great deal of hot flashesThey feel like she is on fire  History Aimee [name] has a past medical history of Arthritis, Depression, Diabetes mellitus, Family history of adverse reaction to anesthesia, GERD (gastroesophageal reflux disease), Hypertension, Hypertriglyceridemia, Neuromuscular disorder ([facility]), PONV  (postoperative nausea and vomiting), PTSD (post-traumatic stress disorder), and PTSD (post-traumatic stress disorder).   She has a past surgical history that includes Abdominoplasty (2006); Inner ear surgery; Thoracic disc surgery (2011, 2012); Abdominal hysterectomy (2010); Cesarean section (1991, 1995); Shoulder arthroscopy (Left, 10/2015); Breast excisional biopsy (Right); Tubal ligation; left  carpal tunnel surgery ; Tonsillectomy (12/26/2017); Cholecystectomy (N/A, 01/11/2018); and Liver biopsy (N/A, 01/11/2018).   Her family history includes Bone cancer in her cousin; Breast cancer in her maternal aunt; Colon cancer in her maternal grandmother; Diabetes in her father; GI problems in her mother; Heart attack in her maternal grandfather, paternal grandfather, paternal grandmother, and paternal uncle; Liver cancer in her maternal aunt; Lymphoma in her paternal uncle.She reports that she has been smoking cigarettes. She has a 5.75 pack-year smoking history. She has never used smokeless tobacco. She reports that she does not drink alcohol or use drugs.  Current Outpatient Medications on File Prior to Visit  Medication Sig Dispense Refill  . amLODipine (NORVASC) 5 MG tablet Take 1 tablet (5 mg total) by mouth daily. 90 tablet 3  . aspirin EC 81 MG tablet Take 1 tablet (81 mg total) by mouth daily. 90 tablet 1  . atorvastatin (LIPITOR) 40 MG tablet Take 1 tablet (40 mg total) by mouth daily. 90 tablet 1  . blood glucose meter kit and supplies KIT Dispense based on patient and insurance preference. Use up to four times daily as directed. (FOR ICD-9 250.00, 250.01). 1 each   Subjective:  Patient ID: Aimee [name], female    DOB: February 14, 1971  Age: 47 y.o. MRN: [id number]  CC: Medical Management of Chronic Issues (pt here today for routine follow up and also c/o cough for the past week)   HPI Aimee [name] presents forFollow-up of diabetes. Patient checks blood sugar at home.  No log returned. Patient denies symptoms such as polyuria, polydipsia, excessive hunger, nausea No significant hypoglycemic spells noted. Medications reviewed. Pt reports taking them regularly without complication/adverse reaction being reported today.  Pt. Having back pain.  It has been awful for the last 2 weeks.  She started seeing chiropractor this week and plans to start physical therapy next week.  The chiropractor has suggested a laser treatment that she is going to try in a few days.  Meanwhile she has not been to her pain clinic in 3 years and does not want to go there.  She did like to try a different pain clinic but perhaps that something different to offer.  Although he wanted to do it the previous one was to give her pain pills which she does not want.  On the other hand she was here recently and her tizanidine was increased to 4 mg 2 p.o. 4 times daily and she would like to have that increased if possible.  She can no longer take the trazodone because it causes her to wet the bed.  She does have some urinary frequency through the day and does get abnormally to go to the bath bathroom during the night.  She would like to continue her lidocaine patches.  She also would like to see a spine doctor but is concerned about going back to the same one who did her neck.  Patient also is having a great deal of hot flashesThey feel like she is on fire  History Adreanne has a past medical history of Arthritis, Depression, Diabetes mellitus, Family history of adverse reaction to anesthesia, GERD (gastroesophageal reflux disease), Hypertension, Hypertriglyceridemia, Neuromuscular disorder ([place]), PONV  (postoperative nausea and vomiting), PTSD (post-traumatic stress disorder), and PTSD (post-traumatic stress disorder).   She has a past surgical history that includes Abdominoplasty (2006); Inner ear surgery; Thoracic disc surgery (2011, 2012); Abdominal hysterectomy (2010); Cesarean section (1991, 1995); Shoulder arthroscopy (Left, 10/2015); Breast excisional biopsy (Right); Tubal ligation; left  carpal tunnel surgery ; Tonsillectomy (12/26/2017); Cholecystectomy (N/A, 01/11/2018); and Liver biopsy (N/A, 01/11/2018).   Her family history includes Bone cancer in her cousin; Breast cancer in her maternal aunt; Colon cancer in her maternal grandmother; Diabetes in her father; GI problems in her mother; Heart attack in her maternal grandfather, paternal grandfather, paternal grandmother, and paternal uncle; Liver cancer in her maternal aunt; Lymphoma in her paternal uncle.She reports that she has been smoking cigarettes. She has a 5.75 pack-year smoking history. She has never used smokeless tobacco. She reports that she does not drink alcohol or use drugs.  Current Outpatient Medications on File Prior to Visit  Medication Sig Dispense Refill  . amLODipine (NORVASC) 5 MG tablet Take 1 tablet (5 mg total) by mouth daily. 90 tablet 3  . aspirin EC 81 MG tablet Take 1 tablet (81 mg total) by mouth daily. 90 tablet 1  . atorvastatin (LIPITOR) 40 MG tablet Take 1 tablet (40 mg total) by mouth daily. 90 tablet 1  . blood glucose meter kit and supplies KIT Dispense based on patient and insurance preference. Use up to four times daily as directed. (FOR ICD-9 250.00, 250.01). 1 each

## 2018-06-08 NOTE — Telephone Encounter (Signed)
Aimee [name] called requesting results from her recent labs that she had done.  Informed her that her MRSA culture was negative.  However patient had additional questions because she states she thought she would get additional testing. Pricilla Riffle RN

## 2018-06-09 LAB — CBC WITH DIFFERENTIAL/PLATELET
BASOS ABS: 0 10*3/uL (ref 0.0–0.2)
Basos: 0 %
EOS (ABSOLUTE): 0.2 10*3/uL (ref 0.0–0.4)
Eos: 1 %
HEMOGLOBIN: 15.4 g/dL (ref 11.1–15.9)
Hematocrit: 46.1 % (ref 34.0–46.6)
IMMATURE GRANULOCYTES: 0 %
Immature Grans (Abs): 0 10*3/uL (ref 0.0–0.1)
LYMPHS ABS: 2.9 10*3/uL (ref 0.7–3.1)
LYMPHS: 24 %
MCH: 31.7 pg (ref 26.6–33.0)
MCHC: 33.4 g/dL (ref 31.5–35.7)
MCV: 95 fL (ref 79–97)
MONOCYTES: 5 %
Monocytes Absolute: 0.6 10*3/uL (ref 0.1–0.9)
NEUTROS PCT: 70 %
Neutrophils Absolute: 8.5 10*3/uL — ABNORMAL HIGH (ref 1.4–7.0)
Platelets: 275 10*3/uL (ref 150–450)
RBC: 4.86 x10E6/uL (ref 3.77–5.28)
RDW: 13.8 % (ref 12.3–15.4)
WBC: 12.1 10*3/uL — AB (ref 3.4–10.8)

## 2018-06-09 LAB — LIPID PANEL
Chol/HDL Ratio: 3 ratio (ref 0.0–4.4)
Cholesterol, Total: 156 mg/dL (ref 100–199)
HDL: 52 mg/dL (ref >39)
LDL CALC: 74 mg/dL (ref 0–99)
Triglycerides: 149 mg/dL (ref 0–149)
VLDL CHOLESTEROL CAL: 30 mg/dL (ref 5–40)

## 2018-06-09 LAB — CMP14+EGFR
ALBUMIN: 4.4 g/dL (ref 3.5–5.5)
ALT: 58 IU/L — AB (ref 0–32)
AST: 27 IU/L (ref 0–40)
Albumin/Globulin Ratio: 1.7 (ref 1.2–2.2)
Alkaline Phosphatase: 53 IU/L (ref 39–117)
BUN / CREAT RATIO: 16 (ref 9–23)
BUN: 12 mg/dL (ref 6–24)
Bilirubin Total: <0.2 mg/dL (ref 0.0–1.2)
CALCIUM: 9.4 mg/dL (ref 8.7–10.2)
CHLORIDE: 101 mmol/L (ref 96–106)
CO2: 24 mmol/L (ref 20–29)
CREATININE: 0.75 mg/dL (ref 0.57–1.00)
GFR, EST AFRICAN AMERICAN: 110 mL/min/{1.73_m2} (ref >59)
GFR, EST NON AFRICAN AMERICAN: 95 mL/min/{1.73_m2} (ref >59)
GLUCOSE: 129 mg/dL — AB (ref 65–99)
Globulin, Total: 2.6 g/dL (ref 1.5–4.5)
Potassium: 4.4 mmol/L (ref 3.5–5.2)
Sodium: 140 mmol/L (ref 134–144)
Total Protein: 7 g/dL (ref 6.0–8.5)

## 2018-06-09 LAB — FSH/LH
FSH: 47.2 m[IU]/mL
LH: 46.6 m[IU]/mL

## 2018-06-09 LAB — TSH: TSH: 0.689 u[IU]/mL (ref 0.450–4.500)

## 2018-06-09 LAB — VITAMIN D 25 HYDROXY (VIT D DEFICIENCY, FRACTURES): VIT D 25 HYDROXY: 31.7 ng/mL (ref 30.0–100.0)

## 2018-06-11 ENCOUNTER — Other Ambulatory Visit: Payer: Self-pay | Admitting: Family Medicine

## 2018-06-11 ENCOUNTER — Encounter: Payer: Self-pay | Admitting: Family Medicine

## 2018-06-11 DIAGNOSIS — M9901 Segmental and somatic dysfunction of cervical region: Secondary | ICD-10-CM | POA: Diagnosis not present

## 2018-06-11 DIAGNOSIS — M9902 Segmental and somatic dysfunction of thoracic region: Secondary | ICD-10-CM | POA: Diagnosis not present

## 2018-06-11 DIAGNOSIS — M9903 Segmental and somatic dysfunction of lumbar region: Secondary | ICD-10-CM | POA: Diagnosis not present

## 2018-06-11 DIAGNOSIS — M5431 Sciatica, right side: Secondary | ICD-10-CM | POA: Diagnosis not present

## 2018-06-11 NOTE — Progress Notes (Signed)
Please contact the patient regarding:HEr tests look normal. There is no sign of menopause or thyroid disease/.

## 2018-06-12 ENCOUNTER — Encounter: Payer: Self-pay | Admitting: Physical Therapy

## 2018-06-12 ENCOUNTER — Ambulatory Visit: Payer: BLUE CROSS/BLUE SHIELD | Attending: Family Medicine | Admitting: Physical Therapy

## 2018-06-12 ENCOUNTER — Encounter: Payer: Self-pay | Admitting: Family Medicine

## 2018-06-12 ENCOUNTER — Other Ambulatory Visit: Payer: Self-pay

## 2018-06-12 DIAGNOSIS — G894 Chronic pain syndrome: Secondary | ICD-10-CM | POA: Diagnosis not present

## 2018-06-12 DIAGNOSIS — Z79899 Other long term (current) drug therapy: Secondary | ICD-10-CM | POA: Diagnosis not present

## 2018-06-12 DIAGNOSIS — R29898 Other symptoms and signs involving the musculoskeletal system: Secondary | ICD-10-CM | POA: Diagnosis not present

## 2018-06-12 DIAGNOSIS — G8929 Other chronic pain: Secondary | ICD-10-CM | POA: Diagnosis not present

## 2018-06-12 DIAGNOSIS — M545 Low back pain, unspecified: Secondary | ICD-10-CM

## 2018-06-12 DIAGNOSIS — M542 Cervicalgia: Secondary | ICD-10-CM | POA: Insufficient documentation

## 2018-06-12 DIAGNOSIS — M5136 Other intervertebral disc degeneration, lumbar region: Secondary | ICD-10-CM | POA: Diagnosis not present

## 2018-06-12 DIAGNOSIS — M5442 Lumbago with sciatica, left side: Secondary | ICD-10-CM | POA: Insufficient documentation

## 2018-06-12 DIAGNOSIS — R293 Abnormal posture: Secondary | ICD-10-CM | POA: Diagnosis not present

## 2018-06-12 DIAGNOSIS — M6281 Muscle weakness (generalized): Secondary | ICD-10-CM

## 2018-06-12 DIAGNOSIS — M129 Arthropathy, unspecified: Secondary | ICD-10-CM | POA: Diagnosis not present

## 2018-06-12 NOTE — Therapy (Signed)
[facility] [place], [place], [address] Phone: [phone]   Fax:  [phone]  Physical Therapy Evaluation  Patient Details  Name: Aimee [name] MRN: [id number] Date of Birth: September 01, 1971 Referring Provider (PT): Darla Lesches, FNP   Encounter Date: 06/12/2018  PT End of Session - 06/12/18 2044    Visit Number  1    Number of Visits  12    Date for PT Re-Evaluation  07/31/18    Authorization Type  Progress note every 10th visit    PT Start Time  1300    PT Stop Time  1345    PT Time Calculation (min)  45 min    Activity Tolerance  Patient tolerated treatment well    Behavior During Therapy  [facility] for tasks assessed/performed       Past Medical History:  Diagnosis Date  . Arthritis   . Depression   . Diabetes mellitus    type II   . Family history of adverse reaction to anesthesia    mother- mother has lots of alleriges   . GERD (gastroesophageal reflux disease)   . Hypertension   . Hypertriglyceridemia   . Neuromuscular disorder ([place])    diabetic neuropathy   . PONV (postoperative nausea and vomiting)   . PTSD (post-traumatic stress disorder)   . PTSD (post-traumatic stress disorder)     Past Surgical History:  Procedure Laterality Date  . ABDOMINAL HYSTERECTOMY  2010  . ABDOMINOPLASTY  2006  . BREAST EXCISIONAL BIOPSY Right   . [place]   x2  . CHOLECYSTECTOMY N/A 01/11/2018   Procedure: LAPAROSCOPIC CHOLECYSTECTOMY;  Surgeon: [name], MD;  Location: [facility];  Service: General;  Laterality: N/A;  PT HAS PTSD AND IS AGGREGATED BY WAITING FOR LONG PERIODS OF TIME.  [facility] EAR SURGERY     12 surgeries on R ear starting in 1984; implant 06/2011  . left  carpal tunnel surgery     . LIVER BIOPSY N/A 01/11/2018   Procedure: LIVER BIOPSY;  Surgeon: [name], MD;  Location: [facility];  Service: General;  Laterality: N/A;  . SHOULDER ARTHROSCOPY Left 10/2015  . THORACIC [place] SURGERY  2011,  2012   x2  . TONSILLECTOMY  12/26/2017  . TUBAL LIGATION      There were no vitals filed for this visit.   Subjective Assessment - 06/12/18 1402    Subjective  Patient arrives to physical therapy with reports of ongoing lumbar spine pain that has progressively gotten worse over the past couple of months. Patient reports DDD, arthritis in hips and spine, as well as bulging discs in multiple areas of the lumbar spine. Patient reports an increase of muscle spasms with all activities and reports she's "out of doing activities by 2 pm." Patient reports pain at worst is 10/10 with increased activity and 3/10 at best with pain medication, heat, and soaking in epsom salt. Patient's goals are to decrease pain, improve movement, improve strength, sleep longer than 4 hours, stand greater than 10 minutes, sit longer than 10 minutes, and have less difficulties with work activities.     Pertinent History  HTN, Diabetes, Neuropathy, Fibromyalgia    Limitations  Lifting;Standing;Sitting;Walking;House hold activities    Diagnostic tests  MRI, X-Ray, CT scan     Patient Stated Goals  impove ablity to perform home activities and decrease pain and spasms         [facility] PT Assessment - 06/12/18 0001  Assessment   Medical Diagnosis  Acute left-sided low back pain with left sided sciatica    Referring Provider (PT)  Darla Lesches, FNP    Onset Date/Surgical Date  --   ongoing   Next MD Visit  December 2019    Prior Therapy  Yes, for neck      Precautions   Precautions  None      Restrictions   Weight Bearing Restrictions  No      Balance Screen   Has the patient fallen in the past 6 months  Yes    How many times?  2   hips gave way   Has the patient had a decrease in activity level because of a fear of falling?   Yes    Is the patient reluctant to leave their home because of a fear of falling?   No      Home Film/video editor residence    Living Arrangements   Spouse/significant other      Prior Function   Level of Independence  Independent      ROM / Strength   AROM / PROM / Strength  AROM;Strength      AROM   AROM Assessment Site  Lumbar    Lumbar Flexion  finger tips to floor (+) pain    Lumbar Extension  8 degrees    Lumbar - Right Side Bend  24 inches finger tip to flor    Lumbar - Left Side Bend  24 inches finger tip to floor      Strength   Overall Strength Comments  Pain with all hip MMT    Strength Assessment Site  Hip;Knee    Right/Left Hip  Right;Left    Right Hip Flexion  4+/5    Right Hip Extension  4/5    Right Hip ABduction  4-/5    Left Hip Flexion  4+/5    Left Hip Extension  4/5    Left Hip ABduction  4/5    Right/Left Knee  Left;Right    Right Knee Flexion  4/5    Right Knee Extension  4+/5    Left Knee Flexion  4/5    Left Knee Extension  4+/5      Palpation   Palpation comment  tenderness to palpation to bilateral lumbar paraspinals, bilateral piriformis, and R SIJ      Special Tests    Special Tests  Lumbar    Lumbar Tests  Straight Leg Raise      Straight Leg Raise   Findings  Positive    Side   Right    Comment  BLE      Transfers   Comments  Independent with transfers with increased time to perform      Ambulation/Gait   Gait Pattern  Step-through pattern;Antalgic;Decreased stance time - right;Trendelenburg                Objective measurements completed on examination: See above findings.                   PT Long Term Goals - 06/12/18 2122      PT LONG TERM GOAL #1   Title  Patient will be independent with HEP and its progression    Time  6    Period  Days    Status  New      PT LONG TERM GOAL #2   Title  Patient will report ability  to perform ADLs with less than or equal to 5/10 low back pain.    Time  6    Period  Days    Status  New      PT LONG TERM GOAL #3   Title  Patient will demonstrate 4/5 or greater LE strength bilaterally to improve stability  during functional tasks.     Time  6    Period  Weeks    Status  New      PT LONG TERM GOAL #4   Title  Patient will report ability to sleep undisturbed for 4 hours or more with less than 5/10 pain in low back.    Time  6    Period  Weeks    Status  New             Plan - 06/12/18 2046    Clinical Impression Statement  Patient is a 47 year old female who presents to physical therapy with low back pain, decreased LE strength and decreased lumbar extension. Patient noted with increased tenderness to palpation to bilateral lumbar paraspinals, bilateral glutes, and to R SIJ. Patient noted with (+) SLR bilaterally. Patient would benefit from skilled physical therapy to address deficits and address patient's goals.     History and Personal Factors relevant to plan of care:  HTN, DM, Fibromyalgia    Clinical Presentation  Evolving    Clinical Decision Making  Low    Rehab Potential  Fair    Clinical Impairments Affecting Rehab Potential  chonicity of pain    PT Frequency  2x / week    PT Duration  6 weeks    PT Treatment/Interventions  ADLs/Self Care Home Management;Moist Heat;Ultrasound;Cryotherapy;Lobbyist;Therapeutic exercise;Therapeutic activities;Functional mobility training;Gait training;Stair training;Passive range of motion;Manual techniques;Dry needling;Taping;Patient/family education    PT Next Visit Plan  nustep if tolerable, core stabilization exercises, STW/M, modalities PRN for pain relief.    PT Home Exercise Plan  see patient education    Consulted and Agree with Plan of Care  Patient       Patient will benefit from skilled therapeutic intervention in order to improve the following deficits and impairments:  Pain, Postural dysfunction, Decreased activity tolerance, Decreased endurance, Decreased range of motion, Decreased strength, Difficulty walking  Visit Diagnosis: Acute low back pain, unspecified back pain laterality, unspecified  whether sciatica present - Plan: PT plan of care cert/re-cert  Muscle weakness (generalized) - Plan: PT plan of care cert/re-cert     Problem List Patient Active Problem List   Diagnosis Date Noted  . Aortic atherosclerosis ([place]) 05/22/2018  . Ganglion cyst of finger 09/13/2017  . Status post placement of bone anchored hearing aid (BAHA) 08/14/2017  . Fibromyalgia 11/29/2016  . Lumbar radiculopathy 11/21/2016  . Essential hypertension 04/22/2016  . Severe obesity (BMI >= 40) ([place]) 10/08/2015  . PTSD (post-traumatic stress disorder) 09/10/2015  . Trapezius strain 08/19/2015  . Depression 08/19/2015  . Hypertriglyceridemia 11/12/2014  . Peripheral neuropathy 11/12/2014  . DDD (degenerative disc disease), lumbar 11/12/2014  . Varicose veins of leg with pain, bilateral 05/12/2014  . Diabetes ([place]) 03/05/2013  . Smoking 03/05/2013   Gabriela Eves, PT, DPT 06/12/2018, 9:37 PM  [facility] [place], [place], [address] Phone: [phone]   Fax:  [phone]  Name: Aimee [name] MRN: [id number] Date of Birth: [birth year]

## 2018-06-13 ENCOUNTER — Ambulatory Visit: Payer: BLUE CROSS/BLUE SHIELD | Admitting: Physical Therapy

## 2018-06-13 ENCOUNTER — Ambulatory Visit (INDEPENDENT_AMBULATORY_CARE_PROVIDER_SITE_OTHER): Payer: Self-pay

## 2018-06-13 ENCOUNTER — Encounter (INDEPENDENT_AMBULATORY_CARE_PROVIDER_SITE_OTHER): Payer: Self-pay | Admitting: Orthopaedic Surgery

## 2018-06-13 ENCOUNTER — Encounter: Payer: Self-pay | Admitting: Family Medicine

## 2018-06-13 ENCOUNTER — Ambulatory Visit (INDEPENDENT_AMBULATORY_CARE_PROVIDER_SITE_OTHER): Payer: BLUE CROSS/BLUE SHIELD | Admitting: Orthopaedic Surgery

## 2018-06-13 ENCOUNTER — Other Ambulatory Visit: Payer: Self-pay | Admitting: Family Medicine

## 2018-06-13 VITALS — BP 109/83 | HR 80 | Ht 68.0 in | Wt 206.0 lb

## 2018-06-13 DIAGNOSIS — R0681 Apnea, not elsewhere classified: Secondary | ICD-10-CM

## 2018-06-13 DIAGNOSIS — M545 Low back pain, unspecified: Secondary | ICD-10-CM

## 2018-06-13 DIAGNOSIS — G8929 Other chronic pain: Secondary | ICD-10-CM | POA: Diagnosis not present

## 2018-06-13 DIAGNOSIS — M6281 Muscle weakness (generalized): Secondary | ICD-10-CM

## 2018-06-13 DIAGNOSIS — M5442 Lumbago with sciatica, left side: Secondary | ICD-10-CM | POA: Diagnosis not present

## 2018-06-13 DIAGNOSIS — M25512 Pain in left shoulder: Secondary | ICD-10-CM

## 2018-06-13 DIAGNOSIS — R293 Abnormal posture: Secondary | ICD-10-CM | POA: Diagnosis not present

## 2018-06-13 DIAGNOSIS — I872 Venous insufficiency (chronic) (peripheral): Secondary | ICD-10-CM | POA: Diagnosis not present

## 2018-06-13 DIAGNOSIS — R29898 Other symptoms and signs involving the musculoskeletal system: Secondary | ICD-10-CM | POA: Diagnosis not present

## 2018-06-13 DIAGNOSIS — M542 Cervicalgia: Secondary | ICD-10-CM | POA: Diagnosis not present

## 2018-06-13 NOTE — Telephone Encounter (Signed)
Notified patient that as she has sent the same message to [facility], and she is already a patient there, they will make the decision on whether she needs a colonoscopy at this point.  Patient agreeable.

## 2018-06-13 NOTE — Therapy (Signed)
[facility] [place], [place], [address] Phone: [phone]   Fax:  [phone]  Physical Therapy Treatment  Patient Details  Name: Aimee [name] MRN: [id number] Date of Birth: [birth year] Referring Provider (PT): Darla Lesches, FNP   Encounter Date: 06/13/2018  PT End of Session - 06/13/18 1422    Visit Number  2    Number of Visits  12    Date for PT Re-Evaluation  07/31/18    Authorization Type  Progress note every 10th visit    PT Start Time  1345    PT Stop Time  1432    PT Time Calculation (min)  47 min    Activity Tolerance  Patient tolerated treatment well    Behavior During Therapy  [facility] for tasks assessed/performed       Past Medical History:  Diagnosis Date  . Arthritis   . Depression   . Diabetes mellitus    type II   . Family history of adverse reaction to anesthesia    mother- mother has lots of alleriges   . GERD (gastroesophageal reflux disease)   . Hypertension   . Hypertriglyceridemia   . Neuromuscular disorder ([place])    diabetic neuropathy   . PONV (postoperative nausea and vomiting)   . PTSD (post-traumatic stress disorder)   . PTSD (post-traumatic stress disorder)     Past Surgical History:  Procedure Laterality Date  . ABDOMINAL HYSTERECTOMY  2010  . ABDOMINOPLASTY  2006  . BREAST EXCISIONAL BIOPSY Right   . [place]   x2  . CHOLECYSTECTOMY N/A 01/11/2018   Procedure: LAPAROSCOPIC CHOLECYSTECTOMY;  Surgeon: [name], MD;  [facility];  Service: General;  Laterality: N/A;  PT HAS PTSD AND IS AGGREGATED BY WAITING FOR LONG PERIODS OF TIME.  [facility]AR [facility]GERY     12 surgeries on R ear starting in 1984; implant 06/2011  . left  carpal tunnel surgery     . LIVER BIOPSY N/A 01/11/2018   Procedure: LIVER BIOPSY;  Surgeon: [name], MD;  [facility];  Service: General;  Laterality: N/A;  . SHOULDER ARTHROSCOPY Left 10/2015  . THORACIC [place] SURGERY  2011,  2012   x2  . TONSILLECTOMY  12/26/2017  . TUBAL LIGATION      There were no vitals filed for this visit.  Subjective Assessment - 06/13/18 1346    Subjective  Patient reports feeling about a 9/10 in her low back. Patient reports she performed her HEP but reports increase of pain since the increase of activity.    Pertinent History  HTN, Diabetes, Neuropathy, Fibromyalgia    Limitations  Lifting;Standing;Sitting;Walking;House hold activities    Diagnostic tests  MRI, X-Ray, CT scan     Patient Stated Goals  impove ablity to perform home activities and decrease pain and spasms    Currently in Pain?  Yes    Pain Score  9     Pain Location  Back    Pain Orientation  Lower    Pain Descriptors / Indicators  Sharp    Pain Type  Chronic pain    Pain Onset  More than a month ago    Pain Frequency  Constant         [facility] PT Assessment - 06/13/18 0001      Assessment   Medical Diagnosis  Acute left-sided low back pain with left sided sciatica    Next MD Visit  December 2019  Prior Therapy  Yes, for neck                   [facility] Adult PT Treatment/Exercise - 06/13/18 0001      Exercises   Exercises  Lumbar      Modalities   Modalities  Electrical Stimulation;Moist Heat      Moist Heat Therapy   Number Minutes Moist Heat  10 Minutes    Moist Heat Location  Lumbar Spine      Electrical Stimulation   Electrical Stimulation Location  bilateral low back    Electrical Stimulation Action  IFC    Electrical Stimulation Parameters  80-150 hz x10 mins    Electrical Stimulation Goals  Pain      Manual Therapy   Manual Therapy  Soft tissue mobilization    Soft tissue mobilization  STW/M to bilateral lumbar paraspinals, and bilateral piriformis to decrease muscle tone.                  PT Long Term Goals - 06/12/18 2122      PT LONG TERM GOAL #1   Title  Patient will be independent with HEP and its progression    Time  6    Period  Days    Status  New       PT LONG TERM GOAL #2   Title  Patient will report ability to perform ADLs with less than or equal to 5/10 low back pain.    Time  6    Period  Days    Status  New      PT LONG TERM GOAL #3   Title  Patient will demonstrate 4/5 or greater LE strength bilaterally to improve stability during functional tasks.     Time  6    Period  Weeks    Status  New      PT LONG TERM GOAL #4   Title  Patient will report ability to sleep undisturbed for 4 hours or more with less than 5/10 pain in low back.    Time  6    Period  Weeks    Status  New            Plan - 06/13/18 1423    Clinical Impression Statement  Patient was able tolerate treatment well with minimal reports of increased pain. Patient noted with increased muscle trigger points throughout the right lumbar paraspinals. Patient noted with decreased tone at end of STW/M. Normal response to modalities upon removal.     Clinical Presentation  Evolving    Clinical Decision Making  Low    Rehab Potential  Fair    Clinical Impairments Affecting Rehab Potential  chonicity of pain    PT Frequency  2x / week    PT Duration  6 weeks    PT Treatment/Interventions  ADLs/Self Care Home Management;Moist Heat;Ultrasound;Cryotherapy;Lobbyist;Therapeutic exercise;Therapeutic activities;Functional mobility training;Gait training;Stair training;Passive range of motion;Manual techniques;Dry needling;Taping;Patient/family education    PT Next Visit Plan  nustep if tolerable, core stabilization exercises, STW/M, modalities PRN for pain relief.    PT Home Exercise Plan  see patient education    Consulted and Agree with Plan of Care  Patient       Patient will benefit from skilled therapeutic intervention in order to improve the following deficits and impairments:  Pain, Postural dysfunction, Decreased activity tolerance, Decreased endurance, Decreased range of motion, Decreased strength, Difficulty walking  Visit  Diagnosis: Acute low back  pain, unspecified back pain laterality, unspecified whether sciatica present  Muscle weakness (generalized)     Problem List Patient Active Problem List   Diagnosis Date Noted  . Aortic atherosclerosis ([place]) 05/22/2018  . Ganglion cyst of finger 09/13/2017  . Status post placement of bone anchored hearing aid (BAHA) 08/14/2017  . Fibromyalgia 11/29/2016  . Lumbar radiculopathy 11/21/2016  . Essential hypertension 04/22/2016  . Severe obesity (BMI >= 40) ([place]) 10/08/2015  . PTSD (post-traumatic stress disorder) 09/10/2015  . Trapezius strain 08/19/2015  . Depression 08/19/2015  . Hypertriglyceridemia 11/12/2014  . Peripheral neuropathy 11/12/2014  . DDD (degenerative disc disease), lumbar 11/12/2014  . Varicose veins of leg with pain, bilateral 05/12/2014  . Diabetes ([place]) 03/05/2013  . Smoking 03/05/2013   Gabriela Eves, PT, DPT 06/13/2018, 2:39 PM  [facility] [place], [place], [address] Phone: [phone]   Fax:  [phone]  Name: Aimee [name] MRN: [id number] Date of Birth: [birth year]

## 2018-06-13 NOTE — Progress Notes (Signed)
Office Visit Note   Patient: Aimee [name]           Date of Birth: 12-Dec-1970           MRN: [id number] Visit Date: 06/13/2018              Requested by: Claretta Fraise, MD [place], [place] [address] PCP: Claretta Fraise, MD   Assessment & Plan: Visit Diagnoses:  1. Chronic left shoulder pain     Plan: Left shoulder subacromial injection performed with improvement in her symptoms.  She can call about proceeding with right carpal tunnel release as an outpatient at her convenience.  Follow-Up Instructions: Return if symptoms worsen or fail to improve.   Orders:  Orders Placed This Encounter  Procedures  . Large Joint Inj: L subacromial bursa  . XR Shoulder Left   No orders of the defined types were placed in this encounter.     Procedures: Large Joint Inj: L subacromial bursa on 06/13/2018 11:35 AM Indications: pain Details: 22 G 1.5 in needle, lateral approach  Arthrogram: No  Medications: 4 mL bupivacaine 0.25 %; 40 mg methylPREDNISolone acetate 40 MG/ML; 0.5 mL lidocaine 1 % Outcome: tolerated well, no immediate complications Procedure, treatment alternatives, risks and benefits explained, specific risks discussed. Consent was given by the patient. Immediately prior to procedure a time out was called to verify the correct patient, procedure, equipment, support staff and site/side marked as required. Patient was prepped and draped in the usual sterile fashion.       Clinical Data: No additional findings.   Subjective: Chief Complaint  Patient presents with  . Left Shoulder - Pain  . Right Wrist - Pain    HPI patient returns is having continued problems with carpal tunnel on the right.  We previously did left carpal tunnel release and it is healed nicely with good relief of her symptoms.  She is also having ongoing problems with her left shoulder with pain with outstretched reaching overhead activities.  She tries to keep her elbow tucked to the side  when she uses things with her hands due to the pain that she has in her shoulder and points the supraspinatus fossa where she has pain with activities of her shoulder outstretched and overhead.  She is had previous cervical fusion by Dr.Kritzer.  Review of Systems 14 point ROS update unchanged from 11/24/2016 other than as mentioned above.   Objective: Vital Signs: BP 109/83   Pulse 80   Ht 5' 8"  (1.727 m)   Wt 206 lb (93.4 kg)   BMI 31.32 kg/m   Physical Exam  Constitutional: She is oriented to person, place, and time. She appears well-developed.  HENT:  Head: Normocephalic.  Right Ear: External ear normal.  Left Ear: External ear normal.  Eyes: Pupils are equal, round, and reactive to light.  Neck: No tracheal deviation present. No thyromegaly present.  Cardiovascular: Normal rate.  Pulmonary/Chest: Effort normal.  Abdominal: Soft.  Neurological: She is alert and oriented to person, place, and time.  Skin: Skin is warm and dry.  Psychiatric: She has a normal mood and affect. Her behavior is normal.    Ortho Exam patient has well-healed left carpal tunnel release incision.  Tenderness on the right side with positive compression test.  Positive impingement left shoulder.  She has an area of tenderness over the supraspinatus medially but no palpable mass no evidence of subcu lipoma.  She has pain with palpation of the lumbar spine  normal hip range of motion.  She is able to heel and toe walk.  Specialty Comments:  No specialty comments available.  Imaging: No results found.   PMFS History: Patient Active Problem List   Diagnosis Date Noted  . Aortic atherosclerosis ([place]) 05/22/2018  . Ganglion cyst of finger 09/13/2017  . Status post placement of bone anchored hearing aid (BAHA) 08/14/2017  . Fibromyalgia 11/29/2016  . Lumbar radiculopathy 11/21/2016  . Essential hypertension 04/22/2016  . Severe obesity (BMI >= 40) ([place]) 10/08/2015  . PTSD (post-traumatic stress  disorder) 09/10/2015  . Trapezius strain 08/19/2015  . Depression 08/19/2015  . Hypertriglyceridemia 11/12/2014  . Peripheral neuropathy 11/12/2014  . DDD (degenerative disc disease), lumbar 11/12/2014  . Varicose veins of leg with pain, bilateral 05/12/2014  . Diabetes ([place]) 03/05/2013  . Smoking 03/05/2013   Past Medical History:  Diagnosis Date  . Arthritis   . Depression   . Diabetes mellitus    type II   . Family history of adverse reaction to anesthesia    mother- mother has lots of alleriges   . GERD (gastroesophageal reflux disease)   . Hypertension   . Hypertriglyceridemia   . Neuromuscular disorder ([place])    diabetic neuropathy   . PONV (postoperative nausea and vomiting)   . PTSD (post-traumatic stress disorder)   . PTSD (post-traumatic stress disorder)     Family History  Problem Relation Age of Onset  . GI problems Mother   . Diabetes Father   . Breast cancer Maternal Aunt        in 38's  . Liver cancer Maternal Aunt   . Colon cancer Maternal Grandmother   . Heart attack Maternal Grandfather   . Heart attack Paternal Grandmother   . Heart attack Paternal Grandfather   . Heart attack Paternal Uncle   . Lymphoma Paternal Uncle   . Bone cancer Cousin     Past Surgical History:  Procedure Laterality Date  . ABDOMINAL HYSTERECTOMY  2010  . ABDOMINOPLASTY  2006  . BREAST EXCISIONAL BIOPSY Right   . [place]   x2  . CHOLECYSTECTOMY N/A 01/11/2018   Procedure: LAPAROSCOPIC CHOLECYSTECTOMY;  Surgeon: [name], MD;  Location[facility];  Service: General;  Laterality: N/A;  PT HAS PTSD AND IS AGGREGATED BY WAITING FOR LONG PERIODS OF TIME.  [facility]AR [facility]     12 surgeries on R ear starting in 1984; implant 06/2011  . left  carpal tunnel surgery     . LIVER BIOPSY N/A 01/11/2018   Procedure: LIVER BIOPSY;  Surgeon: [name], MD;  Location: [facility];  Service: General;  Laterality: N/A;  . SHOULDER ARTHROSCOPY Left 10/2015  . THORACIC  [place] SURGERY  2011, 2012   x2  . TONSILLECTOMY  12/26/2017  . TUBAL LIGATION     Social History   Occupational History    Comment: [employer]  Tobacco Use  . Smoking status: Current Some Day Smoker    Packs/day: 0.25    Years: 23.00    Pack years: 5.75    Types: Cigarettes  . Smokeless tobacco: Never Used  Substance and Sexual Activity  . Alcohol use: No    Alcohol/week: 0.0 standard drinks  . Drug use: No  . Sexual activity: Yes    Partners: Male

## 2018-06-14 ENCOUNTER — Encounter: Payer: Self-pay | Admitting: Family Medicine

## 2018-06-14 ENCOUNTER — Encounter (INDEPENDENT_AMBULATORY_CARE_PROVIDER_SITE_OTHER): Payer: Self-pay | Admitting: Orthopaedic Surgery

## 2018-06-14 NOTE — Telephone Encounter (Signed)
Tell pt. Dose can not be increased, but please send in 6 mos supply as requested.

## 2018-06-15 ENCOUNTER — Encounter: Payer: Self-pay | Admitting: *Deleted

## 2018-06-15 ENCOUNTER — Other Ambulatory Visit: Payer: Self-pay | Admitting: Family Medicine

## 2018-06-15 ENCOUNTER — Other Ambulatory Visit: Payer: Self-pay | Admitting: *Deleted

## 2018-06-15 DIAGNOSIS — M5442 Lumbago with sciatica, left side: Secondary | ICD-10-CM

## 2018-06-15 MED ORDER — DICLOFENAC SODIUM 75 MG PO TBEC: 75.0000 mg | DELAYED_RELEASE_TABLET | Freq: Two times a day (BID) | ORAL | 1 refills | Status: DC

## 2018-06-18 ENCOUNTER — Other Ambulatory Visit: Payer: Self-pay | Admitting: Family Medicine

## 2018-06-18 MED ORDER — OMEGA-3-ACID ETHYL ESTERS 1 G PO CAPS: ORAL_CAPSULE | ORAL | 1 refills | Status: DC

## 2018-06-18 MED ORDER — SUVOREXANT 10 MG PO TABS: 10.0000 mg | ORAL_TABLET | Freq: Every day | ORAL | 5 refills | Status: DC

## 2018-06-18 NOTE — Telephone Encounter (Signed)
What is the name of the medication? Omega 3 1g, Suvorexant 10 mg  Have you contacted your pharmacy to request a refill? YES  Which pharmacy would you like this sent to? [facility] Fax # [phone] Website address to update address and fax and phone # [email or web address]   Patient notified that their request is being sent to the clinical staff for review and that they should receive a call once it is complete. If they do not receive a call within 24 hours they can check with their pharmacy or our office.

## 2018-06-18 NOTE — Telephone Encounter (Signed)
Last seen 06/08/18  Dr [name]

## 2018-06-19 ENCOUNTER — Ambulatory Visit: Payer: BLUE CROSS/BLUE SHIELD | Admitting: *Deleted

## 2018-06-19 ENCOUNTER — Encounter (INDEPENDENT_AMBULATORY_CARE_PROVIDER_SITE_OTHER): Payer: Self-pay | Admitting: Orthopaedic Surgery

## 2018-06-19 ENCOUNTER — Telehealth: Payer: Self-pay | Admitting: *Deleted

## 2018-06-19 DIAGNOSIS — M545 Low back pain, unspecified: Secondary | ICD-10-CM

## 2018-06-19 DIAGNOSIS — R29898 Other symptoms and signs involving the musculoskeletal system: Secondary | ICD-10-CM

## 2018-06-19 DIAGNOSIS — R293 Abnormal posture: Secondary | ICD-10-CM

## 2018-06-19 DIAGNOSIS — M542 Cervicalgia: Secondary | ICD-10-CM | POA: Diagnosis not present

## 2018-06-19 DIAGNOSIS — M5442 Lumbago with sciatica, left side: Secondary | ICD-10-CM | POA: Diagnosis not present

## 2018-06-19 DIAGNOSIS — M6281 Muscle weakness (generalized): Secondary | ICD-10-CM

## 2018-06-19 MED ORDER — METHYLPREDNISOLONE ACETATE 40 MG/ML IJ SUSP
40.0000 mg | INTRAMUSCULAR | Status: AC | PRN
  Administered 2018-06-13: 40 mg via INTRA_ARTICULAR

## 2018-06-19 MED ORDER — BUPIVACAINE HCL 0.25 % IJ SOLN
4.0000 mL | INTRAMUSCULAR | Status: AC | PRN
  Administered 2018-06-13: 4 mL via INTRA_ARTICULAR

## 2018-06-19 MED ORDER — LIDOCAINE HCL 1 % IJ SOLN
0.5000 mL | INTRAMUSCULAR | Status: AC | PRN
  Administered 2018-06-13: .5 mL

## 2018-06-19 NOTE — Telephone Encounter (Signed)
Fax from [facility] ([place]) is not covered by insurance Please advise on alternative

## 2018-06-19 NOTE — Therapy (Signed)
[facility] [address] [place], [place], [address] Phone: [phone]   Fax:  [phone]  Physical Therapy Treatment  Patient Details  Name: Aimee [name] MRN: [id number] Date of Birth: [birth year] Referring Provider (PT): Gilford Silvius, FNP   Encounter Date: 06/19/2018  PT End of Session - 06/19/18 1043    Visit Number  3    Number of Visits  12    Date for PT Re-Evaluation  07/31/18    Authorization Type  Progress note every 10th visit    PT Start Time  0945    PT Stop Time  1034    PT Time Calculation (min)  49 min       Past Medical History:  Diagnosis Date  . Arthritis   . Depression   . Diabetes mellitus    type II   . Family history of adverse reaction to anesthesia    mother- mother has lots of alleriges   . GERD (gastroesophageal reflux disease)   . Hypertension   . Hypertriglyceridemia   . Neuromuscular disorder (HCC)    diabetic neuropathy   . PONV (postoperative nausea and vomiting)   . PTSD (post-traumatic stress disorder)   . PTSD (post-traumatic stress disorder)     Past Surgical History:  Procedure Laterality Date  . ABDOMINAL HYSTERECTOMY  2010  . ABDOMINOPLASTY  2006  . BREAST EXCISIONAL BIOPSY Right   . CESAREAN SECTION  1991, 1995   x2  . CHOLECYSTECTOMY N/A 01/11/2018   Procedure: LAPAROSCOPIC CHOLECYSTECTOMY;  Surgeon: Gaynelle Adu, MD;  Location: [facility];  Service: General;  Laterality: N/A;  PT HAS PTSD AND IS AGGREGATED BY WAITING FOR LONG PERIODS OF TIME.  [facility] EAR SURGERY     12 surgeries on R ear starting in 1984; implant 06/2011  . left  carpal tunnel surgery     . LIVER BIOPSY N/A 01/11/2018   Procedure: LIVER BIOPSY;  Surgeon: Gaynelle Adu, MD;  Location: [facility];  Service: General;  Laterality: N/A;  . SHOULDER ARTHROSCOPY Left 10/2015  . THORACIC DISC SURGERY  2011, 2012   x2  . TONSILLECTOMY  12/26/2017  . TUBAL LIGATION      There were no vitals filed for this  visit.  Subjective Assessment - 06/19/18 0947    Subjective  Did ok after last Rx. Muscle relaxer is helping some    Pertinent History  HTN, Diabetes, Neuropathy, Fibromyalgia    Limitations  Lifting;Standing;Sitting;Walking;House hold activities    Diagnostic tests  MRI, X-Ray, CT scan     Patient Stated Goals  impove ablity to perform home activities and decrease pain and spasms    Currently in Pain?  Yes    Pain Score  8     Pain Location  Back    Pain Descriptors / Indicators  Sharp    Pain Type  Chronic pain    Pain Onset  More than a month ago    Pain Frequency  Constant                       [facility] Adult PT Treatment/Exercise - 06/19/18 0001      Exercises   Exercises  Lumbar      Modalities   Modalities  Electrical Stimulation;Moist Heat;Ultrasound      Moist Heat Therapy   Number Minutes Moist Heat  10 Minutes    Moist Heat Location  Lumbar Spine      Electrical Stimulation   Electrical  Stimulation Location  bilateral low back  IFC x 10 mins 80-150hz     Electrical Stimulation Action  prone    Electrical Stimulation Goals  Pain      Ultrasound   Ultrasound Location  LB paras    Ultrasound Parameters  1.5 w/cm2 x 10 mins prone    Ultrasound Goals  Pain      Manual Therapy   Manual Therapy  Soft tissue mobilization    Manual therapy comments  prone    Soft tissue mobilization  STW/IASTM to bilateral lumbar paraspinals, and LT piriformis active release to decrease muscle tone.                  PT Long Term Goals - 06/12/18 2122      PT LONG TERM GOAL #1   Title  Patient will be independent with HEP and its progression    Time  6    Period  Days    Status  New      PT LONG TERM GOAL #2   Title  Patient will report ability to perform ADLs with less than or equal to 5/10 low back pain.    Time  6    Period  Days    Status  New      PT LONG TERM GOAL #3   Title  Patient will demonstrate 4/5 or greater LE strength bilaterally to  improve stability during functional tasks.     Time  6    Period  Weeks    Status  New      PT LONG TERM GOAL #4   Title  Patient will report ability to sleep undisturbed for 4 hours or more with less than 5/10 pain in low back.    Time  6    Period  Weeks    Status  New            Plan - 06/19/18 1044    Clinical Impression Statement  Pt arrived today doing about the same with pain. [place] combo and STW/IASTM were performed to LB paras / SIJs, as well as LT piriformis. Pt did well with Rx, but experienced a mm spasm whil on heat and TENS after 10 mins and Rx was shortened.. Noted tightness along Bil LB paras and LT piriformis.    Rehab Potential  Fair    Clinical Impairments Affecting Rehab Potential  chonicity of pain    PT Frequency  2x / week    PT Duration  6 weeks    PT Treatment/Interventions  ADLs/Self Care Home Management;Moist Heat;Ultrasound;Cryotherapy;Microbiologist;Therapeutic exercise;Therapeutic activities;Functional mobility training;Gait training;Stair training;Passive range of motion;Manual techniques;Dry needling;Taping;Patient/family education    PT Next Visit Plan  nustep if tolerable, core stabilization exercises, STW/M, modalities PRN for pain relief.    PT Home Exercise Plan  see patient education    Consulted and Agree with Plan of Care  Patient       Patient will benefit from skilled therapeutic intervention in order to improve the following deficits and impairments:  Pain, Postural dysfunction, Decreased activity tolerance, Decreased endurance, Decreased range of motion, Decreased strength, Difficulty walking  Visit Diagnosis: Muscle weakness (generalized)  Acute bilateral low back pain with left-sided sciatica  Acute low back pain, unspecified back pain laterality, unspecified whether sciatica present  Abnormal posture  Other symptoms and signs involving the musculoskeletal system     Problem List Patient Active Problem  List   Diagnosis Date Noted  . Aortic atherosclerosis (  HCC) 05/22/2018  . Ganglion cyst of finger 09/13/2017  . Status post placement of bone anchored hearing aid (BAHA) 08/14/2017  . Fibromyalgia 11/29/2016  . Lumbar radiculopathy 11/21/2016  . Essential hypertension 04/22/2016  . Severe obesity (BMI >= 40) (HCC) 10/08/2015  . PTSD (post-traumatic stress disorder) 09/10/2015  . Trapezius strain 08/19/2015  . Depression 08/19/2015  . Hypertriglyceridemia 11/12/2014  . Peripheral neuropathy 11/12/2014  . DDD (degenerative disc disease), lumbar 11/12/2014  . Varicose veins of leg with pain, bilateral 05/12/2014  . Diabetes (HCC) 03/05/2013  . Smoking 03/05/2013    Keontre Defino,CHRIS, PTA 06/19/2018, 1:15 PM  [facility] [address] [place], [place], [address] Phone: [phone]   Fax:  [phone]  Name: Domita Edds MRN: [id number] Date of Birth: 05-Jun-1971

## 2018-06-19 NOTE — Telephone Encounter (Signed)
Will call back if will not be filled by insurance.

## 2018-06-19 NOTE — Telephone Encounter (Signed)
She can use krill oil instead 300 mg, two, bid. (OTC)

## 2018-06-20 ENCOUNTER — Encounter: Payer: Self-pay | Admitting: Family Medicine

## 2018-06-20 ENCOUNTER — Ambulatory Visit (INDEPENDENT_AMBULATORY_CARE_PROVIDER_SITE_OTHER): Payer: BLUE CROSS/BLUE SHIELD | Admitting: Family Medicine

## 2018-06-20 VITALS — BP 125/94 | HR 83 | Temp 99.0°F | Ht 68.0 in | Wt 207.8 lb

## 2018-06-20 DIAGNOSIS — G6289 Other specified polyneuropathies: Secondary | ICD-10-CM

## 2018-06-20 DIAGNOSIS — M5416 Radiculopathy, lumbar region: Secondary | ICD-10-CM | POA: Diagnosis not present

## 2018-06-20 DIAGNOSIS — R5381 Other malaise: Secondary | ICD-10-CM

## 2018-06-20 DIAGNOSIS — M797 Fibromyalgia: Secondary | ICD-10-CM | POA: Diagnosis not present

## 2018-06-20 MED ORDER — DICLOFENAC SODIUM 75 MG PO TBEC: 75.0000 mg | DELAYED_RELEASE_TABLET | Freq: Two times a day (BID) | ORAL | 1 refills | Status: DC

## 2018-06-20 NOTE — Progress Notes (Signed)
Chief Complaint  Patient presents with  . discuss need for scooter    HPI  Patient presents today for valuation for mechanized scooter.  She has a great deal of weakness in her grip and that makes it difficult for her to avoid falling when using a walker.  She cannot manage a cane is of no use to her.  She is unable to grip a wheelchair adequately due to propel it forward.  Additionally she has been to podiatry and they have diagnosed her with degenerative arthritis of multiple bones of her feet leading to intense pain with ambulation however she is not a candidate for surgical repair.  She has peripheral neuropathy that weakens her 4 extremities.  She also has a lumbar radiculopathy that weakens the lower extremities further.  All of this is in the setting of fibromyalgia which saps her energy.  For these reasons she cannot manage a wheelchair walker or cane.  She needs assistance with performing ADLs around the home.  She frequently cannot ambulate to perform basic tasks including grooming toileting simple chores such as preparing a meal.  She has a tendency to fall and needs support to prevent future falling. PMH: Smoking status noted ROS: Review of Systems  Constitutional: Positive for fatigue. Negative for activity change, appetite change and fever.  HENT: Negative.   Respiratory: Negative for shortness of breath.   Cardiovascular: Negative for chest pain.  Gastrointestinal: Negative for abdominal pain.  Musculoskeletal: Positive for arthralgias, back pain, gait problem, joint swelling, myalgias and neck pain.  Neurological: Positive for dizziness, weakness and numbness.  Psychiatric/Behavioral: Positive for dysphoric mood.    Objective: BP (!) 125/94   Pulse 83   Temp 99 F (37.2 C) (Oral)   Ht 5' 8"  (1.727 m)   Wt 207 lb 12.8 oz (94.3 kg)   BMI 31.60 kg/m  Gen: NAD, alert, cooperative with exam HEENT: NCAT, EOMI, PERRL CV: RRR, good S1/S2, no murmur Resp: CTABL, no wheezes,  non-labored Abd: SNTND, BS present, no guarding or organomegaly Ext: No edema, warm grip strength is 2-3/10 with fatigability of 5 to 10 seconds.  Strength of both upper extremities for flexion and extension is 3/5 with fatigability from poor endurance of 15 seconds. Neuro: Alert and oriented, No gross deficits  Assessment and plan:  1. Lumbar radiculopathy   2. Other polyneuropathy   3. Fibromyalgia   4. Physical debility     Meds ordered this encounter  Medications  . diclofenac (VOLTAREN) 75 MG EC tablet    Sig: Take 1 tablet (75 mg total) by mouth 2 (two) times daily.    Dispense:  180 tablet    Refill:  1    No orders of the defined types were placed in this encounter.   Follow up as needed.  Claretta Fraise, MD

## 2018-06-21 ENCOUNTER — Encounter: Payer: Self-pay | Admitting: Family Medicine

## 2018-06-21 ENCOUNTER — Ambulatory Visit: Payer: BLUE CROSS/BLUE SHIELD | Admitting: *Deleted

## 2018-06-21 DIAGNOSIS — L732 Hidradenitis suppurativa: Secondary | ICD-10-CM | POA: Diagnosis not present

## 2018-06-21 DIAGNOSIS — M545 Low back pain, unspecified: Secondary | ICD-10-CM

## 2018-06-21 DIAGNOSIS — R293 Abnormal posture: Secondary | ICD-10-CM | POA: Diagnosis not present

## 2018-06-21 DIAGNOSIS — M5442 Lumbago with sciatica, left side: Secondary | ICD-10-CM | POA: Diagnosis not present

## 2018-06-21 DIAGNOSIS — M6281 Muscle weakness (generalized): Secondary | ICD-10-CM | POA: Diagnosis not present

## 2018-06-21 DIAGNOSIS — R29898 Other symptoms and signs involving the musculoskeletal system: Secondary | ICD-10-CM

## 2018-06-21 DIAGNOSIS — M542 Cervicalgia: Secondary | ICD-10-CM

## 2018-06-21 DIAGNOSIS — L0591 Pilonidal cyst without abscess: Secondary | ICD-10-CM | POA: Diagnosis not present

## 2018-06-21 NOTE — Therapy (Signed)
[facility] [place], [place], [address] Phone: [phone]   Fax:  [phone]  Physical Therapy Treatment  Patient Details  Name: Aimee [name] MRN: [id number] Date of Birth: December 19, 1970 Referring Provider (PT): Darla Lesches, FNP   Encounter Date: 06/21/2018  PT End of Session - 06/21/18 1213    Visit Number  4    Number of Visits  12    Date for PT Re-Evaluation  07/31/18    Authorization Type  Progress note every 10th visit    PT Start Time  1115    PT Stop Time  1205    PT Time Calculation (min)  50 min       Past Medical History:  Diagnosis Date  . Arthritis   . Depression   . Diabetes mellitus    type II   . Family history of adverse reaction to anesthesia    mother- mother has lots of alleriges   . GERD (gastroesophageal reflux disease)   . Hypertension   . Hypertriglyceridemia   . Neuromuscular disorder ([place])    diabetic neuropathy   . PONV (postoperative nausea and vomiting)   . PTSD (post-traumatic stress disorder)   . PTSD (post-traumatic stress disorder)     Past Surgical History:  Procedure Laterality Date  . ABDOMINAL HYSTERECTOMY  2010  . ABDOMINOPLASTY  2006  . BREAST EXCISIONAL BIOPSY Right   . [place]   x2  . CHOLECYSTECTOMY N/A 01/11/2018   Procedure: LAPAROSCOPIC CHOLECYSTECTOMY;  Surgeon: [name], MD;  Location: [facility];  Service: General;  Laterality: N/A;  PT HAS PTSD AND IS AGGREGATED BY WAITING FOR LONG PERIODS OF TIME.  [facility] EAR SURGERY     12 surgeries on R ear starting in 1984; implant 06/2011  . left  carpal tunnel surgery     . LIVER BIOPSY N/A 01/11/2018   Procedure: LIVER BIOPSY;  Surgeon: [name], MD;  Location: [facility];  Service: General;  Laterality: N/A;  . SHOULDER ARTHROSCOPY Left 10/2015  . THORACIC [place] SURGERY  2011, 2012   x2  . TONSILLECTOMY  12/26/2017  . TUBAL LIGATION      There were no vitals filed for this  visit.  Subjective Assessment - 06/21/18 1210    Subjective  Did ok after last Rx. Pain is a little less.    Pertinent History  HTN, Diabetes, Neuropathy, Fibromyalgia    Limitations  Lifting;Standing;Sitting;Walking;House hold activities    Diagnostic tests  MRI, X-Ray, CT scan     Patient Stated Goals  impove ablity to perform home activities and decrease pain and spasms    Currently in Pain?  Yes    Pain Score  7     Pain Location  Back    Pain Orientation  Lower    Pain Descriptors / Indicators  Sharp    Pain Type  Chronic pain    Pain Onset  More than a month ago    Pain Frequency  Constant                       [facility] Adult PT Treatment/Exercise - 06/21/18 0001      Exercises   Exercises  Lumbar      Modalities   Modalities  Electrical Stimulation;Moist Heat;Ultrasound      Moist Heat Therapy   Number Minutes Moist Heat  15 Minutes    Moist Heat Location  Lumbar Spine  Acupuncturist Location  bilateral low back  IFC x 10 mins 80-150hz     Electrical Stimulation Action  supine    Electrical Stimulation Goals  Pain      Ultrasound   Ultrasound Location  LB paras    Ultrasound Parameters   Combo 1.5 w/cm2 x 12 mins    Ultrasound Goals  Pain      Manual Therapy   Manual Therapy  Soft tissue mobilization    Manual therapy comments  prone    Soft tissue mobilization  STW/IASTM to bilateral lumbar paraspinals, and LT piriformis active release to decrease muscle tone.                  PT Long Term Goals - 06/12/18 2122      PT LONG TERM GOAL #1   Title  Patient will be independent with HEP and its progression    Time  6    Period  Days    Status  New      PT LONG TERM GOAL #2   Title  Patient will report ability to perform ADLs with less than or equal to 5/10 low back pain.    Time  6    Period  Days    Status  New      PT LONG TERM GOAL #3   Title  Patient will demonstrate 4/5 or greater LE  strength bilaterally to improve stability during functional tasks.     Time  6    Period  Weeks    Status  New      PT LONG TERM GOAL #4   Title  Patient will report ability to sleep undisturbed for 4 hours or more with less than 5/10 pain in low back.    Time  6    Period  Weeks    Status  New            Plan - 06/21/18 1215    Clinical Impression Statement  Pt arrived today reporting doing well after last Rx and was able to tolerate today very well.  [place] combo was performed F/B STW in prone and tolerated well. Estim and HMP were performed in hooklying with no muscle spasms and did well. Decreased pain after Rx.    Clinical Presentation  Evolving    Clinical Decision Making  Low    Rehab Potential  Fair    Clinical Impairments Affecting Rehab Potential  chonicity of pain    PT Frequency  2x / week    PT Duration  6 weeks    PT Treatment/Interventions  ADLs/Self Care Home Management;Moist Heat;Ultrasound;Cryotherapy;Lobbyist;Therapeutic exercise;Therapeutic activities;Functional mobility training;Gait training;Stair training;Passive range of motion;Manual techniques;Dry needling;Taping;Patient/family education    PT Next Visit Plan  nustep if tolerable, core stabilization exercises, STW/M, modalities PRN for pain relief.    PT Home Exercise Plan  see patient education    Consulted and Agree with Plan of Care  Patient       Patient will benefit from skilled therapeutic intervention in order to improve the following deficits and impairments:  Pain, Postural dysfunction, Decreased activity tolerance, Decreased endurance, Decreased range of motion, Decreased strength, Difficulty walking  Visit Diagnosis: Muscle weakness (generalized)  Acute bilateral low back pain with left-sided sciatica  Acute low back pain, unspecified back pain laterality, unspecified whether sciatica present  Abnormal posture  Other symptoms and signs involving the  musculoskeletal system  Cervicalgia     Problem  List Patient Active Problem List   Diagnosis Date Noted  . Aortic atherosclerosis ([place]) 05/22/2018  . Ganglion cyst of finger 09/13/2017  . Status post placement of bone anchored hearing aid (BAHA) 08/14/2017  . Fibromyalgia 11/29/2016  . Lumbar radiculopathy 11/21/2016  . Essential hypertension 04/22/2016  . Severe obesity (BMI >= 40) ([place]) 10/08/2015  . PTSD (post-traumatic stress disorder) 09/10/2015  . Trapezius strain 08/19/2015  . Depression 08/19/2015  . Hypertriglyceridemia 11/12/2014  . Peripheral neuropathy 11/12/2014  . DDD (degenerative disc disease), lumbar 11/12/2014  . Varicose veins of leg with pain, bilateral 05/12/2014  . Diabetes ([place]) 03/05/2013  . Smoking 03/05/2013    RAMSEUR,CHRIS, PTA 06/21/2018, 3:58 PM  [facility] [place], [place], [address] Phone: [phone]   Fax:  [phone]  Name: Aimee [name] MRN: [id number] Date of Birth: [birth year]

## 2018-06-25 ENCOUNTER — Encounter: Payer: Self-pay | Admitting: Family Medicine

## 2018-06-25 ENCOUNTER — Other Ambulatory Visit: Payer: Self-pay | Admitting: Family Medicine

## 2018-06-25 ENCOUNTER — Telehealth: Payer: Self-pay

## 2018-06-25 DIAGNOSIS — G894 Chronic pain syndrome: Secondary | ICD-10-CM | POA: Diagnosis not present

## 2018-06-25 DIAGNOSIS — G4710 Hypersomnia, unspecified: Secondary | ICD-10-CM | POA: Diagnosis not present

## 2018-06-25 DIAGNOSIS — J449 Chronic obstructive pulmonary disease, unspecified: Secondary | ICD-10-CM | POA: Diagnosis not present

## 2018-06-25 DIAGNOSIS — E119 Type 2 diabetes mellitus without complications: Secondary | ICD-10-CM | POA: Diagnosis not present

## 2018-06-25 NOTE — Telephone Encounter (Signed)
Needs some clarification as to what kind of scooter she needs

## 2018-06-26 ENCOUNTER — Other Ambulatory Visit: Payer: Self-pay | Admitting: Family Medicine

## 2018-06-26 ENCOUNTER — Encounter: Payer: Self-pay | Admitting: Family Medicine

## 2018-06-26 ENCOUNTER — Encounter: Payer: BLUE CROSS/BLUE SHIELD | Admitting: *Deleted

## 2018-06-26 DIAGNOSIS — M129 Arthropathy, unspecified: Secondary | ICD-10-CM | POA: Diagnosis not present

## 2018-06-26 DIAGNOSIS — G8929 Other chronic pain: Secondary | ICD-10-CM | POA: Diagnosis not present

## 2018-06-26 DIAGNOSIS — Z6831 Body mass index (BMI) 31.0-31.9, adult: Secondary | ICD-10-CM | POA: Diagnosis not present

## 2018-06-26 DIAGNOSIS — Z79899 Other long term (current) drug therapy: Secondary | ICD-10-CM | POA: Diagnosis not present

## 2018-06-26 DIAGNOSIS — M545 Low back pain: Secondary | ICD-10-CM | POA: Diagnosis not present

## 2018-06-26 DIAGNOSIS — G894 Chronic pain syndrome: Secondary | ICD-10-CM | POA: Diagnosis not present

## 2018-06-26 DIAGNOSIS — L0591 Pilonidal cyst without abscess: Secondary | ICD-10-CM | POA: Diagnosis not present

## 2018-06-26 MED ORDER — OMEGA-3-ACID ETHYL ESTERS 1 G PO CAPS: ORAL_CAPSULE | ORAL | 1 refills | Status: DC

## 2018-06-27 ENCOUNTER — Telehealth (INDEPENDENT_AMBULATORY_CARE_PROVIDER_SITE_OTHER): Payer: Self-pay | Admitting: Orthopaedic Surgery

## 2018-06-27 NOTE — Telephone Encounter (Signed)
fyi

## 2018-06-27 NOTE — Telephone Encounter (Signed)
Patient calling to ask if Dr. Lorin Mercy can take care of a pilonidal cyst (at the base of her spine, top of buttocks) at the same time that he does the right carpal tunnel release surgery on October 28th at [place].  She went to [facility] and they told her she would need to see a surgeon, so she went to see someone in [place] and when they spoke of "putting her to sleep", she remembered that she is having surgery for carpal tunnel on 28th,  also "ear surgery" on the 4th of November, and now the cyst would require another surgery date with more anesthesia. She is concerned about the number of times she will "be under" all within a short period of time.   cb  [phone]

## 2018-06-27 NOTE — Telephone Encounter (Signed)
Please advise 

## 2018-06-27 NOTE — Telephone Encounter (Signed)
I called and discussed.  General surgery has privileges for pilonidal cyst removal.  She wants to leave her carpal tunnel procedure scheduled where it is which is only usually a 5 to 10 minutes surgery.FYI

## 2018-06-28 ENCOUNTER — Encounter: Payer: Self-pay | Admitting: Physical Therapy

## 2018-06-28 ENCOUNTER — Ambulatory Visit: Payer: BLUE CROSS/BLUE SHIELD | Admitting: Physical Therapy

## 2018-06-28 DIAGNOSIS — M5442 Lumbago with sciatica, left side: Secondary | ICD-10-CM

## 2018-06-28 DIAGNOSIS — M542 Cervicalgia: Secondary | ICD-10-CM | POA: Diagnosis not present

## 2018-06-28 DIAGNOSIS — R29898 Other symptoms and signs involving the musculoskeletal system: Secondary | ICD-10-CM | POA: Diagnosis not present

## 2018-06-28 DIAGNOSIS — M6281 Muscle weakness (generalized): Secondary | ICD-10-CM | POA: Diagnosis not present

## 2018-06-28 DIAGNOSIS — R293 Abnormal posture: Secondary | ICD-10-CM | POA: Diagnosis not present

## 2018-06-28 DIAGNOSIS — M545 Low back pain: Secondary | ICD-10-CM | POA: Diagnosis not present

## 2018-06-28 NOTE — Therapy (Signed)
[facility] [place], [place], [address] Phone: [phone]   Fax:  [phone]  Physical Therapy Treatment  Patient Details  Name: Aimee [name] MRN: [id number] Date of Birth: [birth year] Referring Provider (PT): Darla Lesches, FNP   Encounter Date: 06/28/2018  PT End of Session - 06/28/18 0818    Visit Number  5    Number of Visits  12    Date for PT Re-Evaluation  07/31/18    Authorization Type  Progress note every 10th visit    PT Start Time  0818    PT Stop Time  0902    PT Time Calculation (min)  44 min    Activity Tolerance  Patient tolerated treatment well    Behavior During Therapy  [facility] for tasks assessed/performed       Past Medical History:  Diagnosis Date  . Arthritis   . Depression   . Diabetes mellitus    type II   . Family history of adverse reaction to anesthesia    mother- mother has lots of alleriges   . GERD (gastroesophageal reflux disease)   . Hypertension   . Hypertriglyceridemia   . Neuromuscular disorder ([place])    diabetic neuropathy   . PONV (postoperative nausea and vomiting)   . PTSD (post-traumatic stress disorder)   . PTSD (post-traumatic stress disorder)     Past Surgical History:  Procedure Laterality Date  . ABDOMINAL HYSTERECTOMY  2010  . ABDOMINOPLASTY  2006  . BREAST EXCISIONAL BIOPSY Right   . [place]   x2  . CHOLECYSTECTOMY N/A 01/11/2018   Procedure: LAPAROSCOPIC CHOLECYSTECTOMY;  Surgeon: [name], MD;  Location: [facility];  Service: General;  Laterality: N/A;  PT HAS PTSD AND IS AGGREGATED BY WAITING FOR LONG PERIODS OF TIME.  [facility] EAR SURGERY     12 surgeries on R ear starting in 1984; implant 06/2011  . left  carpal tunnel surgery     . LIVER BIOPSY N/A 01/11/2018   Procedure: LIVER BIOPSY;  Surgeon: [name], MD;  Location: [facility];  Service: General;  Laterality: N/A;  . SHOULDER ARTHROSCOPY Left 10/2015  . THORACIC [place] SURGERY  2011,  2012   x2  . TONSILLECTOMY  12/26/2017  . TUBAL LIGATION      There were no vitals filed for this visit.  Subjective Assessment - 06/28/18 0817    Subjective  Reports having such pain last night that she was unable to sleep.    Pertinent History  HTN, Diabetes, Neuropathy, Fibromyalgia    Limitations  Lifting;Standing;Sitting;Walking;House hold activities    Diagnostic tests  MRI, X-Ray, CT scan     Patient Stated Goals  impove ablity to perform home activities and decrease pain and spasms    Currently in Pain?  Yes    Pain Score  9     Pain Location  Back    Pain Orientation  Mid;Lower    Pain Descriptors / Indicators  Aching;Sharp    Pain Type  Chronic pain    Pain Onset  More than a month ago    Pain Frequency  Constant         [facility] PT Assessment - 06/28/18 0001      Assessment   Medical Diagnosis  Acute left-sided low back pain with left sided sciatica    Next MD Visit  December 2019    Prior Therapy  Yes, for neck      Precautions  Precautions  None      Restrictions   Weight Bearing Restrictions  No                   [facility] Adult PT Treatment/Exercise - 06/28/18 0001      Modalities   Modalities  Electrical Stimulation;Moist Heat;Ultrasound   completed in prone     Moist Heat Therapy   Number Minutes Moist Heat  15 Minutes    Moist Heat Location  Lumbar Spine      Electrical Stimulation   Electrical Stimulation Location  B low back    Electrical Stimulation Action  IFC    Electrical Stimulation Parameters  80-150 hz x15 min    Electrical Stimulation Goals  Pain      Ultrasound   Ultrasound Location  B lumbar paraspinals    Ultrasound Parameters  Combo 1.5 w/cm2, 100%, 1 mhz x10 min    Ultrasound Goals  Pain      Manual Therapy   Manual Therapy  Soft tissue mobilization    Manual therapy comments  prone    Soft tissue mobilization  STW to B lumbar paraspinals, SI joint, QL to reduce pain                  PT Long Term  Goals - 06/12/18 2122      PT LONG TERM GOAL #1   Title  Patient will be independent with HEP and its progression    Time  6    Period  Days    Status  New      PT LONG TERM GOAL #2   Title  Patient will report ability to perform ADLs with less than or equal to 5/10 low back pain.    Time  6    Period  Days    Status  New      PT LONG TERM GOAL #3   Title  Patient will demonstrate 4/5 or greater LE strength bilaterally to improve stability during functional tasks.     Time  6    Period  Weeks    Status  New      PT LONG TERM GOAL #4   Title  Patient will report ability to sleep undisturbed for 4 hours or more with less than 5/10 pain in low back.    Time  6    Period  Weeks    Status  New            Plan - 06/28/18 0912    Clinical Impression Statement  Patient arrived in clinic with reports of increased mid and low back pain. Patient had no reports of any tenderness with manual therapy. Normal modalities response noted following removal of the modalities. Minimally increased muscle tightness along B lumbar paraspinals.    Rehab Potential  Fair    Clinical Impairments Affecting Rehab Potential  chonicity of pain    PT Frequency  2x / week    PT Duration  6 weeks    PT Treatment/Interventions  ADLs/Self Care Home Management;Moist Heat;Ultrasound;Cryotherapy;Lobbyist;Therapeutic exercise;Therapeutic activities;Functional mobility training;Gait training;Stair training;Passive range of motion;Manual techniques;Dry needling;Taping;Patient/family education    PT Next Visit Plan  nustep if tolerable, core stabilization exercises, STW/M, modalities PRN for pain relief.    PT Home Exercise Plan  see patient education    Consulted and Agree with Plan of Care  Patient       Patient will benefit from skilled therapeutic intervention in order to  improve the following deficits and impairments:  Pain, Postural dysfunction, Decreased activity tolerance,  Decreased endurance, Decreased range of motion, Decreased strength, Difficulty walking  Visit Diagnosis: Muscle weakness (generalized)  Acute bilateral low back pain with left-sided sciatica     Problem List Patient Active Problem List   Diagnosis Date Noted  . Aortic atherosclerosis ([place]) 05/22/2018  . Ganglion cyst of finger 09/13/2017  . Status post placement of bone anchored hearing aid (BAHA) 08/14/2017  . Fibromyalgia 11/29/2016  . Lumbar radiculopathy 11/21/2016  . Essential hypertension 04/22/2016  . Severe obesity (BMI >= 40) ([place]) 10/08/2015  . PTSD (post-traumatic stress disorder) 09/10/2015  . Trapezius strain 08/19/2015  . Depression 08/19/2015  . Hypertriglyceridemia 11/12/2014  . Peripheral neuropathy 11/12/2014  . DDD (degenerative disc disease), lumbar 11/12/2014  . Varicose veins of leg with pain, bilateral 05/12/2014  . Diabetes ([place]) 03/05/2013  . Smoking 03/05/2013    Standley Brooking, PTA 06/28/2018, 9:31 AM  [facility] [address] [place], [place], [address] Phone: [phone]   Fax:  [phone]  Name: Daizee Firmin MRN: [id number] Date of Birth: 04-Jan-1971

## 2018-06-30 DIAGNOSIS — M542 Cervicalgia: Secondary | ICD-10-CM | POA: Diagnosis not present

## 2018-06-30 DIAGNOSIS — S134XXA Sprain of ligaments of cervical spine, initial encounter: Secondary | ICD-10-CM | POA: Diagnosis not present

## 2018-06-30 DIAGNOSIS — Z6832 Body mass index (BMI) 32.0-32.9, adult: Secondary | ICD-10-CM | POA: Diagnosis not present

## 2018-06-30 DIAGNOSIS — R52 Pain, unspecified: Secondary | ICD-10-CM | POA: Diagnosis not present

## 2018-07-03 ENCOUNTER — Other Ambulatory Visit: Payer: Self-pay | Admitting: Family Medicine

## 2018-07-03 ENCOUNTER — Ambulatory Visit: Payer: BLUE CROSS/BLUE SHIELD | Admitting: *Deleted

## 2018-07-03 DIAGNOSIS — M6281 Muscle weakness (generalized): Secondary | ICD-10-CM | POA: Diagnosis not present

## 2018-07-03 DIAGNOSIS — M545 Low back pain, unspecified: Secondary | ICD-10-CM

## 2018-07-03 DIAGNOSIS — R293 Abnormal posture: Secondary | ICD-10-CM | POA: Diagnosis not present

## 2018-07-03 DIAGNOSIS — R29898 Other symptoms and signs involving the musculoskeletal system: Secondary | ICD-10-CM

## 2018-07-03 DIAGNOSIS — M542 Cervicalgia: Secondary | ICD-10-CM | POA: Diagnosis not present

## 2018-07-03 DIAGNOSIS — M5442 Lumbago with sciatica, left side: Secondary | ICD-10-CM

## 2018-07-03 NOTE — Therapy (Addendum)
Aimee [name], [place], [address] Phone: [phone]   Fax:  [phone]  Physical Therapy Treatment/Discharge PHYSICAL THERAPY DISCHARGE SUMMARY  Visits from Start of Care: 6  Current functional level related to goals / functional outcomes: See below   Remaining deficits: See goals   Education / Equipment: HEP  Plan: Patient agrees to discharge.  Patient goals were not met. Patient is being discharged due to not returning since the last visit.  ?????   Aimee [name], PT, DPT 12/11/18      Patient Details  Name: Aimee [name] MRN: [id number] Date of Birth: 1971-03-09 Referring Provider (PT): Darla Lesches, FNP   Encounter Date: 07/03/2018  PT End of Session - 07/03/18 1042    Visit Number  6    Number of Visits  12    Date for PT Re-Evaluation  07/31/18    Authorization Type  Progress note every 10th visit    PT Start Time  1040    PT Stop Time  0867    PT Time Calculation (min)  51 min       Past Medical History:  Diagnosis Date  . Arthritis   . Depression   . Diabetes mellitus    type II   . Family history of adverse reaction to anesthesia    mother- mother has lots of alleriges   . GERD (gastroesophageal reflux disease)   . Hypertension   . Hypertriglyceridemia   . Neuromuscular disorder ([place])    diabetic neuropathy   . PONV (postoperative nausea and vomiting)   . PTSD (post-traumatic stress disorder)   . PTSD (post-traumatic stress disorder)     Past Surgical History:  Procedure Laterality Date  . ABDOMINAL HYSTERECTOMY  2010  . ABDOMINOPLASTY  2006  . BREAST EXCISIONAL BIOPSY Right   . [place]   x2  . CHOLECYSTECTOMY N/A 01/11/2018   Procedure: LAPAROSCOPIC CHOLECYSTECTOMY;  Surgeon: [name], MD;  Location: [facility];  Service: General;  Laterality: N/A;  PT HAS PTSD AND IS AGGREGATED BY WAITING FOR LONG PERIODS OF TIME.  [facility] EAR SURGERY     12  surgeries on R ear starting in 1984; implant 06/2011  . left  carpal tunnel surgery     . LIVER BIOPSY N/A 01/11/2018   Procedure: LIVER BIOPSY;  Surgeon: [name], MD;  Location: [facility];  Service: General;  Laterality: N/A;  . SHOULDER ARTHROSCOPY Left 10/2015  . THORACIC [place] SURGERY  2011, 2012   x2  . TONSILLECTOMY  12/26/2017  . TUBAL LIGATION      There were no vitals filed for this visit.  Subjective Assessment - 07/03/18 1039    Subjective  Mid and LB spasms yesterday for about 15 mins    Pertinent History  HTN, Diabetes, Neuropathy, Fibromyalgia    Limitations  Lifting;Standing;Sitting;Walking;House hold activities    Diagnostic tests  MRI, X-Ray, CT scan     Patient Stated Goals  impove ablity to perform home activities and decrease pain and spasms    Currently in Pain?  Yes    Pain Score  8     Pain Location  Back    Pain Orientation  Mid;Lower    Pain Descriptors / Indicators  Aching;Sharp    Pain Type  Chronic pain    Pain Onset  More than a month ago    Pain Frequency  Constant  [facility] Adult PT Treatment/Exercise - 07/03/18 0001      Exercises   Exercises  Lumbar      Modalities   Modalities  Electrical Stimulation;Moist Heat;Ultrasound      Moist Heat Therapy   Number Minutes Moist Heat  15 Minutes    Moist Heat Location  Lumbar Spine      Electrical Stimulation   Electrical Stimulation Location  bilateral low back  IFC x 10 mins 80-150hz     Electrical Stimulation Goals  Pain      Ultrasound   Ultrasound Location  LT SIJ , RT thoracolumbar paras    Ultrasound Parameters  Combo 1.5 w/cm2 x 12 mins prone    Ultrasound Goals  Pain      Manual Therapy   Manual Therapy  Soft tissue mobilization    Manual therapy comments  prone    Soft tissue mobilization  STW and IASTM  to B lumbar paraspinals, SI joint,and thoracic paras in prone to reduce pain                  PT Long Term Goals - 07/03/18 1131       PT LONG TERM GOAL #1   Title  Patient will be independent with HEP and its progression    Time  6    Period  Days    Status  On-going      PT LONG TERM GOAL #2   Title  Patient will report ability to perform ADLs with less than or equal to 5/10 low back pain.    Time  6    Period  Days    Status  On-going      PT LONG TERM GOAL #3   Title  Patient will demonstrate 4/5 or greater LE strength bilaterally to improve stability during functional tasks.     Time  6    Period  Weeks    Status  On-going      PT LONG TERM GOAL #4   Title  Patient will report ability to sleep undisturbed for 4 hours or more with less than 5/10 pain in low back.    Time  6    Period  Weeks    Status  On-going            Plan - 07/03/18 1133    Clinical Impression Statement  Pt arrived to clinic today with increased pain to 8/10 in thoracolumbar paras/ LT SIJ. She reports having muscle spasms yesterday with sharp pain for about 15 mins. She did well with Rx and had decreased pain after session. LTGs are ongoing due to pain and Pt still unable to sleep more than 2-3 hrs at a time due to pain. Normal modality response after removal of modalities.    Clinical Presentation  Evolving    Clinical Decision Making  Low    Rehab Potential  Fair    Clinical Impairments Affecting Rehab Potential  chonicity of pain    PT Frequency  2x / week    PT Duration  6 weeks    PT Treatment/Interventions  ADLs/Self Care Home Management;Moist Heat;Ultrasound;Cryotherapy;Lobbyist;Therapeutic exercise;Therapeutic activities;Functional mobility training;Gait training;Stair training;Passive range of motion;Manual techniques;Dry needling;Taping;Patient/family education    PT Next Visit Plan  nustep if tolerable, core stabilization exercises, STW/M, modalities PRN for pain relief.    PT Home Exercise Plan  see patient education    Consulted and Agree with Plan of Care  Patient  Patient  will benefit from skilled therapeutic intervention in order to improve the following deficits and impairments:  Pain, Postural dysfunction, Decreased activity tolerance, Decreased endurance, Decreased range of motion, Decreased strength, Difficulty walking  Visit Diagnosis: Muscle weakness (generalized)  Acute bilateral low back pain with left-sided sciatica  Acute low back pain, unspecified back pain laterality, unspecified whether sciatica present  Abnormal posture  Other symptoms and signs involving the musculoskeletal system     Problem List Patient Active Problem List   Diagnosis Date Noted  . Aortic atherosclerosis ([place]) 05/22/2018  . Ganglion cyst of finger 09/13/2017  . Status post placement of bone anchored hearing aid (BAHA) 08/14/2017  . Fibromyalgia 11/29/2016  . Lumbar radiculopathy 11/21/2016  . Essential hypertension 04/22/2016  . Severe obesity (BMI >= 40) ([place]) 10/08/2015  . PTSD (post-traumatic stress disorder) 09/10/2015  . Trapezius strain 08/19/2015  . Depression 08/19/2015  . Hypertriglyceridemia 11/12/2014  . Peripheral neuropathy 11/12/2014  . DDD (degenerative disc disease), lumbar 11/12/2014  . Varicose veins of leg with pain, bilateral 05/12/2014  . Diabetes ([place]) 03/05/2013  . Smoking 03/05/2013    RAMSEUR,CHRIS, PTA 07/03/2018, 11:53 AM  [facility] [place], [place], [address] Phone: [phone]   Fax:  [phone]  Name: Tehya Leath MRN: [id number] Date of Birth: 1971-07-07

## 2018-07-03 NOTE — Telephone Encounter (Signed)
Last seen 06/20/18 

## 2018-07-09 ENCOUNTER — Telehealth: Payer: Self-pay | Admitting: Family Medicine

## 2018-07-09 DIAGNOSIS — G5601 Carpal tunnel syndrome, right upper limb: Secondary | ICD-10-CM | POA: Diagnosis not present

## 2018-07-09 NOTE — Telephone Encounter (Signed)
Please forward this to Dr. [name] as he will be back tomorrow and this is a call that he should make

## 2018-07-09 NOTE — Telephone Encounter (Signed)
Pt has called states that she just got out of surgery and they told her that she needed to call here due to that she fell last week and hit the concrete hard and said that Dr [name] needs to order CT of the head. Would like to go to [facility]. Call the cell first if not able to get on there call the home number

## 2018-07-10 ENCOUNTER — Other Ambulatory Visit: Payer: Self-pay | Admitting: Family Medicine

## 2018-07-10 ENCOUNTER — Encounter: Payer: Self-pay | Admitting: Family Medicine

## 2018-07-10 ENCOUNTER — Telehealth: Payer: Self-pay

## 2018-07-10 DIAGNOSIS — G44309 Post-traumatic headache, unspecified, not intractable: Secondary | ICD-10-CM

## 2018-07-10 NOTE — Telephone Encounter (Signed)
Urgent CT Head ordered

## 2018-07-10 NOTE — Telephone Encounter (Signed)
[email or web address]

## 2018-07-10 NOTE — Telephone Encounter (Signed)
Pt aware that we are waiting on insurance approval;

## 2018-07-10 NOTE — Telephone Encounter (Signed)
Aware. Her cholesterol is normal.

## 2018-07-10 NOTE — Telephone Encounter (Signed)
Please let her know. She has inquired multiple times. Thanks, WS

## 2018-07-10 NOTE — Telephone Encounter (Signed)
Insurance denied prior auth for [place]   Only approved with a Triglyceride of over 500

## 2018-07-11 ENCOUNTER — Other Ambulatory Visit: Payer: BLUE CROSS/BLUE SHIELD

## 2018-07-12 ENCOUNTER — Ambulatory Visit
Admission: RE | Admit: 2018-07-12 | Discharge: 2018-07-12 | Disposition: A | Discharge status: Home / Self Care | Payer: BLUE CROSS/BLUE SHIELD | Source: Ambulatory Visit | Attending: Family Medicine | Admitting: Family Medicine

## 2018-07-12 DIAGNOSIS — G44309 Post-traumatic headache, unspecified, not intractable: Secondary | ICD-10-CM | POA: Diagnosis not present

## 2018-07-12 DIAGNOSIS — S0990XA Unspecified injury of head, initial encounter: Secondary | ICD-10-CM | POA: Diagnosis not present

## 2018-07-12 DIAGNOSIS — G8928 Other chronic postprocedural pain: Secondary | ICD-10-CM | POA: Diagnosis not present

## 2018-07-12 DIAGNOSIS — H90A31 Mixed conductive and sensorineural hearing loss, unilateral, right ear with restricted hearing on the contralateral side: Secondary | ICD-10-CM | POA: Diagnosis not present

## 2018-07-12 DIAGNOSIS — H7011 Chronic mastoiditis, right ear: Secondary | ICD-10-CM | POA: Diagnosis not present

## 2018-07-13 ENCOUNTER — Encounter: Payer: Self-pay | Admitting: Family Medicine

## 2018-07-13 ENCOUNTER — Other Ambulatory Visit: Payer: BLUE CROSS/BLUE SHIELD

## 2018-07-16 DIAGNOSIS — E119 Type 2 diabetes mellitus without complications: Secondary | ICD-10-CM | POA: Diagnosis not present

## 2018-07-16 DIAGNOSIS — L0591 Pilonidal cyst without abscess: Secondary | ICD-10-CM | POA: Diagnosis not present

## 2018-07-16 DIAGNOSIS — L0501 Pilonidal cyst with abscess: Secondary | ICD-10-CM | POA: Diagnosis not present

## 2018-07-16 DIAGNOSIS — I10 Essential (primary) hypertension: Secondary | ICD-10-CM | POA: Diagnosis not present

## 2018-07-16 DIAGNOSIS — L905 Scar conditions and fibrosis of skin: Secondary | ICD-10-CM | POA: Diagnosis not present

## 2018-07-17 ENCOUNTER — Institutional Professional Consult (permissible substitution): Payer: BLUE CROSS/BLUE SHIELD | Admitting: Neurology

## 2018-07-19 ENCOUNTER — Inpatient Hospital Stay (INDEPENDENT_AMBULATORY_CARE_PROVIDER_SITE_OTHER): Payer: BLUE CROSS/BLUE SHIELD | Admitting: Orthopaedic Surgery

## 2018-07-20 ENCOUNTER — Encounter (INDEPENDENT_AMBULATORY_CARE_PROVIDER_SITE_OTHER): Payer: Self-pay | Admitting: Orthopaedic Surgery

## 2018-07-20 ENCOUNTER — Ambulatory Visit (INDEPENDENT_AMBULATORY_CARE_PROVIDER_SITE_OTHER): Payer: BLUE CROSS/BLUE SHIELD | Admitting: Orthopaedic Surgery

## 2018-07-20 VITALS — BP 125/83 | HR 104 | Ht 68.0 in | Wt 207.0 lb

## 2018-07-20 DIAGNOSIS — Z9889 Other specified postprocedural states: Secondary | ICD-10-CM

## 2018-07-20 NOTE — Progress Notes (Signed)
Follow-up right carpal tunnel release.  She just had pilonidal cyst surgery last week and has sore buttocks.  Incision is intact.  She will return to the [facility] on Monday for a nurse visit with Hassan Rowan to have her sutures removed.  She can follow-up with me on a as needed basis.

## 2018-07-24 DIAGNOSIS — H905 Unspecified sensorineural hearing loss: Secondary | ICD-10-CM | POA: Diagnosis not present

## 2018-07-30 DIAGNOSIS — T8130XA Disruption of wound, unspecified, initial encounter: Secondary | ICD-10-CM | POA: Diagnosis not present

## 2018-07-30 DIAGNOSIS — G894 Chronic pain syndrome: Secondary | ICD-10-CM | POA: Diagnosis not present

## 2018-07-30 DIAGNOSIS — M5136 Other intervertebral disc degeneration, lumbar region: Secondary | ICD-10-CM | POA: Diagnosis not present

## 2018-07-30 DIAGNOSIS — M47817 Spondylosis without myelopathy or radiculopathy, lumbosacral region: Secondary | ICD-10-CM | POA: Diagnosis not present

## 2018-07-30 DIAGNOSIS — M79604 Pain in right leg: Secondary | ICD-10-CM | POA: Diagnosis not present

## 2018-07-30 DIAGNOSIS — L0591 Pilonidal cyst without abscess: Secondary | ICD-10-CM | POA: Diagnosis not present

## 2018-07-30 DIAGNOSIS — Z09 Encounter for follow-up examination after completed treatment for conditions other than malignant neoplasm: Secondary | ICD-10-CM | POA: Diagnosis not present

## 2018-08-01 DIAGNOSIS — I781 Nevus, non-neoplastic: Secondary | ICD-10-CM | POA: Diagnosis not present

## 2018-08-14 ENCOUNTER — Ambulatory Visit: Payer: BLUE CROSS/BLUE SHIELD | Admitting: Family Medicine

## 2018-08-15 DIAGNOSIS — L0591 Pilonidal cyst without abscess: Secondary | ICD-10-CM | POA: Diagnosis not present

## 2018-08-20 ENCOUNTER — Encounter: Payer: Self-pay | Admitting: Family Medicine

## 2018-08-22 DIAGNOSIS — L080 Pyoderma: Secondary | ICD-10-CM | POA: Diagnosis not present

## 2018-08-22 DIAGNOSIS — L409 Psoriasis, unspecified: Secondary | ICD-10-CM | POA: Diagnosis not present

## 2018-08-22 DIAGNOSIS — L089 Local infection of the skin and subcutaneous tissue, unspecified: Secondary | ICD-10-CM | POA: Diagnosis not present

## 2018-08-25 ENCOUNTER — Other Ambulatory Visit: Payer: Self-pay | Admitting: Family Medicine

## 2018-08-25 ENCOUNTER — Encounter: Payer: Self-pay | Admitting: Family Medicine

## 2018-08-25 ENCOUNTER — Encounter (INDEPENDENT_AMBULATORY_CARE_PROVIDER_SITE_OTHER): Payer: Self-pay | Admitting: Orthopaedic Surgery

## 2018-08-31 ENCOUNTER — Ambulatory Visit: Payer: BLUE CROSS/BLUE SHIELD | Admitting: Family Medicine

## 2018-09-03 ENCOUNTER — Encounter: Payer: Self-pay | Admitting: Family Medicine

## 2018-09-07 DIAGNOSIS — G8929 Other chronic pain: Secondary | ICD-10-CM | POA: Diagnosis not present

## 2018-09-07 DIAGNOSIS — Z79899 Other long term (current) drug therapy: Secondary | ICD-10-CM | POA: Diagnosis not present

## 2018-09-07 DIAGNOSIS — M545 Low back pain: Secondary | ICD-10-CM | POA: Diagnosis not present

## 2018-09-07 DIAGNOSIS — G894 Chronic pain syndrome: Secondary | ICD-10-CM | POA: Diagnosis not present

## 2018-09-18 ENCOUNTER — Encounter: Payer: Self-pay | Admitting: Family Medicine

## 2018-10-02 ENCOUNTER — Encounter: Payer: Self-pay | Admitting: Family Medicine

## 2018-10-15 ENCOUNTER — Encounter: Payer: Self-pay | Admitting: Nurse Practitioner

## 2018-10-15 ENCOUNTER — Ambulatory Visit: Payer: BLUE CROSS/BLUE SHIELD | Admitting: Nurse Practitioner

## 2018-10-15 VITALS — BP 129/82 | HR 70 | Temp 97.8°F | Ht 68.0 in | Wt 213.0 lb

## 2018-10-15 DIAGNOSIS — J029 Acute pharyngitis, unspecified: Secondary | ICD-10-CM | POA: Diagnosis not present

## 2018-10-15 DIAGNOSIS — M7551 Bursitis of right shoulder: Secondary | ICD-10-CM

## 2018-10-15 DIAGNOSIS — J020 Streptococcal pharyngitis: Secondary | ICD-10-CM

## 2018-10-15 DIAGNOSIS — B370 Candidal stomatitis: Secondary | ICD-10-CM

## 2018-10-15 MED ORDER — FLUCONAZOLE 150 MG PO TABS: 150.0000 mg | ORAL_TABLET | Freq: Once | ORAL | 0 refills | Status: AC

## 2018-10-15 MED ORDER — NYSTATIN 100000 UNIT/ML MT SUSP: 5.0000 mL | Freq: Four times a day (QID) | OROMUCOSAL | 0 refills | Status: DC

## 2018-10-15 MED ORDER — BUPIVACAINE HCL 0.25 % IJ SOLN
1.0000 mL | Freq: Once | INTRAMUSCULAR | Status: AC
  Administered 2018-10-15: 1 mL via INTRA_ARTICULAR

## 2018-10-15 MED ORDER — AMOXICILLIN 875 MG PO TABS: 875.0000 mg | ORAL_TABLET | Freq: Two times a day (BID) | ORAL | 0 refills | Status: DC

## 2018-10-15 MED ORDER — METHYLPREDNISOLONE ACETATE 40 MG/ML IJ SUSP
40.0000 mg | Freq: Once | INTRAMUSCULAR | Status: AC
  Administered 2018-10-15: 40 mg via INTRAMUSCULAR

## 2018-10-15 NOTE — Progress Notes (Signed)
Subjective:    Patient ID: Aimee [name], female    DOB: 07/09/1971, 48 y.o.   MRN: [id number]   Chief Complaint: Sore Throat (with tongue sensititvity, started last Thursday)   HPI Patient come sin today c/o sore throat and tongue sensitivity since Thursday. Whenever she eats her tongue burns. - has chronic bursitis in right shoulder. Steroid injection over a year ago that helped. Would like to have injected again. Started hurting again a bout 1 month ago. She says they both hurt but right is worse then left.  Review of Systems  Constitutional: Negative for appetite change, chills and fever.  HENT: Positive for congestion, sore throat, trouble swallowing and voice change.   Respiratory: Negative for cough.   Gastrointestinal: Positive for nausea.  Neurological: Positive for headaches.  All other systems reviewed and are negative.      Objective:   Physical Exam Constitutional:      General: She is not in acute distress.    Appearance: She is well-developed. She is obese.  HENT:     Right Ear: Hearing, tympanic membrane, ear canal and external ear normal.     Left Ear: Hearing, tympanic membrane, ear canal and external ear normal.     Nose: Congestion and rhinorrhea present.     Right Sinus: No maxillary sinus tenderness or frontal sinus tenderness.     Left Sinus: No maxillary sinus tenderness or frontal sinus tenderness.     Mouth/Throat:     Lips: Pink.     Pharynx: Posterior oropharyngeal erythema present.     Comments: Tongue has white patches in back Neck:     Musculoskeletal: Normal range of motion and neck supple.  Cardiovascular:     Rate and Rhythm: Normal rate and regular rhythm.     Heart sounds: Normal heart sounds.  Pulmonary:     Effort: Pulmonary effort is normal.     Breath sounds: Normal breath sounds.  Abdominal:     General: Abdomen is flat. Bowel sounds are normal.     Palpations: Abdomen is soft.  Musculoskeletal:     Comments: FROM of right  shoulder with pain on abduction and extension   Lymphadenopathy:     Cervical: No cervical adenopathy.  Skin:    General: Skin is warm and dry.  Neurological:     General: No focal deficit present.     Mental Status: She is alert and oriented to person, place, and time.  Psychiatric:        Mood and Affect: Mood normal.        Behavior: Behavior normal.    BP 129/82   Pulse 70   Temp 97.8 F (36.6 C) (Oral)   Ht 5' 8"  (1.727 m)   Wt 213 lb (96.6 kg)   BMI 32.39 kg/m   Procedure: betadine prep to posterior shoulder right  Marcaine 0.25% plain with depomedrol 61m INtrarticular right shoulder with 22g needle  Patient tolerated well.       Assessment & Plan:  MArlet Marterin today with chief complaint of Sore Throat (with tongue sensititvity, started last Thursday)   1. Sore throat  - Rapid Strep Screen ([facility] ONLY)  2. Strep pharyngitis Force fluids Motrin or tylenol OTC OTC decongestant Throat lozenges if help New toothbrush in 3 days  - amoxicillin (AMOXIL) 875 MG tablet; Take 1 tablet (875 mg total) by mouth 2 (two) times daily. 1 po BID  Dispense: 20 tablet; Refill: 0 - fluconazole (DIFLUCAN)  150 MG tablet; Take 1 tablet (150 mg total) by mouth once for 1 dose.  Dispense: 1 tablet; Refill: 0  3. Thrush - nystatin (MYCOSTATIN) 100000 UNIT/ML suspension; Take 5 mLs (500,000 Units total) by mouth 4 (four) times daily.  Dispense: 60 mL; Refill: 0  4. Acute bursitis of right shoulder Rest Ice BID - methylPREDNISolone acetate (DEPO-MEDROL) injection 40 mg - bupivacaine (MARCAINE) 0.25 % (with pres) injection 1 mL  Aimee Hassell Done, FNP

## 2018-10-15 NOTE — Patient Instructions (Signed)
Oral Thrush, Adult  Oral thrush is an infection in your mouth and throat. It causes white patches on your tongue and in your mouth. Follow these instructions at home: Helping with soreness   To lessen your pain: ? Drink cold liquids, like water and iced tea. ? Eat frozen ice pops or frozen juices. ? Eat foods that are easy to swallow, like gelatin and ice cream. ? Drink from a straw if the patches in your mouth are painful. General instructions  Take or use over-the-counter and prescription medicines only as told by your doctor. Medicine for oral thrush may be something to swallow, or it may be something to put on the infected area.  Eat plain yogurt that has live cultures in it. Read the label to make sure.  If you wear dentures: ? Take out your dentures before you go to bed. ? Brush them well. ? Soak them in a denture cleaner.  Rinse your mouth with warm salt-water many times a day. To make the salt-water mixture, completely dissolve 1/2-1 teaspoon of salt in 1 cup of warm water. Contact a doctor if:  Your problems are getting worse.  Your problems do not get better in less than 7 days with treatment.  Your infection is spreading. This may show as white patches on the skin outside of your mouth.  You are nursing your baby and you have redness and pain in the nipples. This information is not intended to replace advice given to you by your health care provider. Make sure you discuss any questions you have with your health care provider. Document Released: 11/23/2009 Document Revised: 05/23/2016 Document Reviewed: 05/23/2016 Elsevier Interactive Patient Education  [employer].

## 2018-10-16 ENCOUNTER — Other Ambulatory Visit: Payer: Self-pay | Admitting: *Deleted

## 2018-10-16 ENCOUNTER — Other Ambulatory Visit: Payer: BLUE CROSS/BLUE SHIELD

## 2018-10-16 DIAGNOSIS — Z111 Encounter for screening for respiratory tuberculosis: Secondary | ICD-10-CM

## 2018-10-16 LAB — RAPID STREP SCREEN (MED CTR MEBANE ONLY): Strep Gp A Ag, IA W/Reflex: POSITIVE — AB

## 2018-10-18 LAB — QUANTIFERON-TB GOLD PLUS
QuantiFERON Nil Value: 0.02 IU/mL
QuantiFERON TB1 Ag Value: 0.02 IU/mL
QuantiFERON TB2 Ag Value: 0.02 IU/mL
QuantiFERON-TB Gold Plus: NEGATIVE

## 2018-10-19 ENCOUNTER — Other Ambulatory Visit: Payer: Self-pay | Admitting: Nurse Practitioner

## 2018-10-19 ENCOUNTER — Telehealth: Payer: Self-pay | Admitting: Family Medicine

## 2018-10-19 DIAGNOSIS — B370 Candidal stomatitis: Secondary | ICD-10-CM

## 2018-10-19 NOTE — Telephone Encounter (Signed)
PT spouse has called states that the pt is about out of nystatin (MYCOSTATIN) 100000 UNIT/ML suspension and is wanting a refill [facility]

## 2018-10-22 NOTE — Telephone Encounter (Signed)
Closing encounter, will be addressed in refill request from pharmcy

## 2018-10-30 ENCOUNTER — Other Ambulatory Visit: Payer: Self-pay | Admitting: Family Medicine

## 2018-11-07 ENCOUNTER — Encounter: Payer: Self-pay | Admitting: Family Medicine

## 2018-11-08 ENCOUNTER — Telehealth: Payer: Self-pay | Admitting: Family Medicine

## 2018-11-08 DIAGNOSIS — E1142 Type 2 diabetes mellitus with diabetic polyneuropathy: Secondary | ICD-10-CM

## 2018-11-08 DIAGNOSIS — E781 Pure hyperglyceridemia: Secondary | ICD-10-CM

## 2018-11-08 NOTE — Telephone Encounter (Signed)
PT scheduled an apt for 11/27/2018 with Dr [name], and wants to see if orders can be placed for her to come in and do her labs, so that Dr [name] can discuss the results with her at the visit.

## 2018-11-08 NOTE — Telephone Encounter (Signed)
Pt was scheduling an appointment through [facility] request and wanted to check and see about getting copy of script for her power chair.

## 2018-11-08 NOTE — Telephone Encounter (Signed)
Pt aware ok to come in early to have labs drawn. Lab orders placed.

## 2018-11-13 ENCOUNTER — Ambulatory Visit (INDEPENDENT_AMBULATORY_CARE_PROVIDER_SITE_OTHER): Payer: BLUE CROSS/BLUE SHIELD

## 2018-11-13 ENCOUNTER — Ambulatory Visit: Payer: BLUE CROSS/BLUE SHIELD | Admitting: Family Medicine

## 2018-11-13 ENCOUNTER — Encounter: Payer: Self-pay | Admitting: Family Medicine

## 2018-11-13 VITALS — BP 127/94 | HR 91 | Temp 97.9°F | Ht 68.0 in | Wt 211.8 lb

## 2018-11-13 DIAGNOSIS — W19XXXA Unspecified fall, initial encounter: Secondary | ICD-10-CM | POA: Diagnosis not present

## 2018-11-13 DIAGNOSIS — M79641 Pain in right hand: Secondary | ICD-10-CM | POA: Diagnosis not present

## 2018-11-13 DIAGNOSIS — S6391XA Sprain of unspecified part of right wrist and hand, initial encounter: Secondary | ICD-10-CM

## 2018-11-13 NOTE — Progress Notes (Signed)
BP (!) 127/94   Pulse 91   Temp 97.9 F (36.6 C) (Oral)   Ht 5' 8"  (1.727 m)   Wt 211 lb 12.8 oz (96.1 kg)   BMI 32.20 kg/m    Subjective:    Patient ID: Aimee [name], female    DOB: August 04, 1971, 47 y.o.   MRN: [id number]  HPI: Aimee [name] is a 48 y.o. female presenting on 11/13/2018 for Fall (Patient states she fell at work today and c/o right hand pain.)   HPI Right wrist/arm pain Patient comes in today complaining of right arm/wrist pain that she sustained from a fall at work.  She said somebody had spilled coffee and she slipped on it and landed on her right arm somehow.  She cannot recall exactly how she landed on her right arm but it is been hurting more and more throughout the day.  She did take some Tylenol for it but does not take anything else at this point.  She was concerned because she has had some easy fractures previously.  Relevant past medical, surgical, family and social history reviewed and updated as indicated. Interim medical history since our last visit reviewed. Allergies and medications reviewed and updated.  Review of Systems  Constitutional: Negative for chills and fever.  Eyes: Negative for visual disturbance.  Respiratory: Negative for chest tightness and shortness of breath.   Cardiovascular: Negative for chest pain and leg swelling.  Musculoskeletal: Positive for arthralgias and joint swelling. Negative for back pain and gait problem.  Skin: Positive for color change (Bruising). Negative for rash.  Neurological: Negative for light-headedness and headaches.  Psychiatric/Behavioral: Negative for agitation and behavioral problems.  All other systems reviewed and are negative.   Per HPI unless specifically indicated above    Objective:    BP (!) 127/94   Pulse 91   Temp 97.9 F (36.6 C) (Oral)   Ht 5' 8"  (1.727 m)   Wt 211 lb 12.8 oz (96.1 kg)   BMI 32.20 kg/m   Wt Readings from Last 3 Encounters:  11/13/18 211 lb 12.8 oz (96.1 kg)    10/15/18 213 lb (96.6 kg)  07/20/18 207 lb (93.9 kg)    Physical Exam Vitals signs and nursing note reviewed.  Constitutional:      General: She is not in acute distress.    Appearance: She is well-developed. She is not diaphoretic.  Eyes:     Conjunctiva/sclera: Conjunctivae normal.  Musculoskeletal: Normal range of motion.        General: Swelling and tenderness present.     Right wrist: She exhibits tenderness and bony tenderness. She exhibits normal range of motion, no crepitus and no deformity.  Skin:    General: Skin is warm and dry.     Findings: No rash.  Neurological:     Mental Status: She is alert and oriented to person, place, and time.     Coordination: Coordination normal.  Psychiatric:        Behavior: Behavior normal.     Right hand x-ray: No signs of acute bony abnormality, await final read from radiology    Assessment & Plan:   Problem List Items Addressed This Visit    None    Visit Diagnoses    Hand sprain, right, initial encounter    -  Primary   Fall, initial encounter       Relevant Orders   DG Hand Complete Right       Conservative management including ice and  stretching and elevation   Patient wanted a wrist splint so we gave her a right wrist thumb spica that she can wear at work when she is using that arm a lot and typing but at home I want it off and have her moving it around and exercising it. Follow up plan: Return if symptoms worsen or fail to improve.  Counseling provided for all of the vaccine components Orders Placed This Encounter  Procedures  . DG Hand Complete Right    Caryl Pina, MD [facility] 11/13/2018, 4:52 PM

## 2018-11-13 NOTE — Patient Instructions (Signed)
Wrist Sprain, Adult A wrist sprain is a stretch or tear in the strong, fibrous tissues (ligaments) that connect your wrist bones. There are three types of wrist sprains:  Grade 1. In this type of sprain, the ligament is stretched more than normal.  Grade 2. In this type of sprain, the ligament is partially torn. You may be able to move your wrist, but not very much.  Grade 3. In this type of sprain, the ligament or muscle is completely torn. You may find it difficult or extremely painful to move your wrist even a little. What are the causes? A wrist sprain can be caused by using the wrist too much during sports, exercise, or at work. It can also happen with a fall or during an accident. What increases the risk? This condition is more likely to occur in people:  With a previous wrist or arm injury.  With poor wrist strength and flexibility.  Who play contact sports, such as football or soccer.  Who play sports that may result in a fall, such as skateboarding, biking, skiing, or snowboarding.  Who do not exercise regularly.  Who use exercise equipment that does not fit well. What are the signs or symptoms? Symptoms of this condition include:  Pain in the wrist, arm, or hand.  Swelling or bruised skin near the wrist, hand, or arm. The skin may look yellow or kind of blue.  Stiffness or trouble moving the hand.  Hearing a pop or feeling a tear at the time of the injury.  A warm feeling in the skin around the wrist. How is this diagnosed? This condition is diagnosed with a physical exam. Sometimes an X-ray is taken to make sure a bone did not break. If your health care provider thinks that you tore a ligament, he or she may order an MRI of your wrist. How is this treated? This condition is treated by resting and applying ice to your wrist. Additional treatment may include:  Medicine for pain and inflammation.  A splint to keep your wrist still (immobilized).  Exercises to  strengthen and stretch your wrist.  Surgery. This may be done if the ligament is completely torn. Follow these instructions at home: If you have a splint:   Do not put pressure on any part of the splint until it is fully hardened. This may take several hours.  Wear the splint as told by your health care provider. Remove it only as told by your health care provider.  Loosen the splint if your fingers tingle, become numb, or turn cold and blue.  If your splint is not waterproof: ? Do not let it get wet. ? Cover it with a watertight covering when you take a bath or a shower.  Keep the splint clean. Managing pain, stiffness, and swelling   If directed, put ice on the injured area. ? If you have a removable splint, remove it as told by your health care provider. ? Put ice in a plastic bag. ? Place a towel between your skin and the bag or between the splint and the bag. ? Leave the ice on for 20 minutes, 2-3 times per day.  Move your fingers often to avoid stiffness and to lessen swelling.  Raise (elevate) the injured area above the level of your heart while you are sitting or lying down. Activity  Rest your wrist. Do not do things that cause pain.  Return to your normal activities as told by your health care provider.  Ask your health care provider what activities are safe for you.  Do exercises as told by your health care provider. General instructions  Take over-the-counter and prescription medicines only as told by your health care provider.  Do not use any products that contain nicotine or tobacco, such as cigarettes and e-cigarettes. These can delay healing. If you need help quitting, ask your health care provider.  Ask your health care provider when it is safe to drive if you have a splint.  Keep all follow-up visits as told by your health care provider. This is important. Contact a health care provider if:  Your pain, bruising, or swelling gets worse.  Your skin  becomes red, gets a rash, or has open sores.  Your pain does not get better or it gets worse. Get help right away if:  You have a new or sudden sharp pain in the hand, arm, or wrist.  You have tingling or numbness in your hand.  Your fingers turn white, very red, or cold and blue.  You cannot move your fingers. This information is not intended to replace advice given to you by your health care provider. Make sure you discuss any questions you have with your health care provider. Document Released: 05/02/2014 Document Revised: 03/26/2016 Document Reviewed: 03/17/2016 Elsevier Interactive Patient Education  2019 [employer].

## 2018-11-15 ENCOUNTER — Other Ambulatory Visit: Payer: Self-pay | Admitting: Family Medicine

## 2018-11-15 DIAGNOSIS — Z1231 Encounter for screening mammogram for malignant neoplasm of breast: Secondary | ICD-10-CM

## 2018-11-19 DIAGNOSIS — M79604 Pain in right leg: Secondary | ICD-10-CM | POA: Diagnosis not present

## 2018-11-19 DIAGNOSIS — M47817 Spondylosis without myelopathy or radiculopathy, lumbosacral region: Secondary | ICD-10-CM | POA: Diagnosis not present

## 2018-11-19 DIAGNOSIS — M549 Dorsalgia, unspecified: Secondary | ICD-10-CM | POA: Diagnosis not present

## 2018-11-19 DIAGNOSIS — M5136 Other intervertebral disc degeneration, lumbar region: Secondary | ICD-10-CM | POA: Diagnosis not present

## 2018-11-21 ENCOUNTER — Other Ambulatory Visit: Payer: Self-pay | Admitting: Pain Medicine

## 2018-11-21 DIAGNOSIS — M545 Low back pain, unspecified: Secondary | ICD-10-CM

## 2018-11-22 ENCOUNTER — Encounter (INDEPENDENT_AMBULATORY_CARE_PROVIDER_SITE_OTHER): Payer: Self-pay | Admitting: Orthopaedic Surgery

## 2018-11-25 ENCOUNTER — Ambulatory Visit
Admission: RE | Admit: 2018-11-25 | Discharge: 2018-11-25 | Disposition: A | Discharge status: Home / Self Care | Payer: BLUE CROSS/BLUE SHIELD | Source: Ambulatory Visit | Attending: Pain Medicine | Admitting: Pain Medicine

## 2018-11-25 ENCOUNTER — Other Ambulatory Visit: Payer: Self-pay

## 2018-11-25 DIAGNOSIS — M545 Low back pain, unspecified: Secondary | ICD-10-CM

## 2018-11-25 DIAGNOSIS — M48061 Spinal stenosis, lumbar region without neurogenic claudication: Secondary | ICD-10-CM | POA: Diagnosis not present

## 2018-11-27 ENCOUNTER — Ambulatory Visit: Payer: BLUE CROSS/BLUE SHIELD | Admitting: Family Medicine

## 2018-11-27 ENCOUNTER — Telehealth: Payer: Self-pay | Admitting: Family Medicine

## 2018-11-27 DIAGNOSIS — M545 Low back pain: Secondary | ICD-10-CM | POA: Diagnosis not present

## 2018-11-29 ENCOUNTER — Encounter: Payer: Self-pay | Admitting: Family Medicine

## 2018-11-30 ENCOUNTER — Telehealth: Payer: BLUE CROSS/BLUE SHIELD | Admitting: Family

## 2018-11-30 DIAGNOSIS — J069 Acute upper respiratory infection, unspecified: Secondary | ICD-10-CM

## 2018-11-30 MED ORDER — FLUTICASONE PROPIONATE 50 MCG/ACT NA SUSP: 2.0000 | Freq: Every day | NASAL | 0 refills | Status: DC

## 2018-11-30 MED ORDER — PROMETHAZINE-DM 6.25-15 MG/5ML PO SYRP: 5.0000 mL | ORAL_SOLUTION | Freq: Four times a day (QID) | ORAL | 0 refills | Status: DC | PRN

## 2018-11-30 NOTE — Progress Notes (Signed)
We are sorry you are not feeling well.  Here is how we plan to help!  Based on what you have shared with me, it looks like you may have a viral upper respiratory infection.  Upper respiratory infections are caused by a large number of viruses; however, rhinovirus is the most common cause.   Symptoms vary from person to person, with common symptoms including sore throat, cough, and fatigue or lack of energy.  A low-grade fever of up to 100.4 may present, but is often uncommon.  Symptoms vary however, and are closely related to a person's age or underlying illnesses.  The most common symptoms associated with an upper respiratory infection are nasal discharge or congestion, cough, sneezing, headache and pressure in the ears and face.  These symptoms usually persist for about 3 to 10 days, but can last up to 2 weeks.  It is important to know that upper respiratory infections do not cause serious illness or complications in most cases.    Upper respiratory infections can be transmitted from person to person, with the most common method of transmission being a person's hands.  The virus is able to live on the skin and can infect other persons for up to 2 hours after direct contact.  Also, these can be transmitted when someone coughs or sneezes; thus, it is important to cover the mouth to reduce this risk.  To keep the spread of the illness at [place], good hand hygiene is very important.  This is an infection that is most likely caused by a virus. There are no specific treatments other than to help you with the symptoms until the infection runs its course.  We are sorry you are not feeling well.  Here is how we plan to help!   For nasal congestion, you may use an oral decongestants such as Mucinex D or if you have glaucoma or high blood pressure use plain Mucinex.  Saline nasal spray or nasal drops can help and can safely be used as often as needed for congestion.  For your congestion, I have prescribed Fluticasone  nasal spray one spray in each nostril twice a day  If you do not have a history of heart disease, hypertension, diabetes or thyroid disease, prostate/bladder issues or glaucoma, you may also use Sudafed to treat nasal congestion.  It is highly recommended that you consult with a pharmacist or your primary care physician to ensure this medication is safe for you to take.     If you have a cough, you may use cough suppressants such as Delsym and Robitussin.  If you have glaucoma or high blood pressure, you can also use Coricidin HBP.   For cough I have prescribed for you  Promethazine DM 1 tsp 4 times a day as needed  If you have a sore or scratchy throat, use a saltwater gargle-  to  teaspoon of salt dissolved in a 4-ounce to 8-ounce glass of warm water.  Gargle the solution for approximately 15-30 seconds and then spit.  It is important not to swallow the solution.  You can also use throat lozenges/cough drops and Chloraseptic spray to help with throat pain or discomfort.  Warm or cold liquids can also be helpful in relieving throat pain.  For headache, pain or general discomfort, you can use Ibuprofen or Tylenol as directed.   Some authorities believe that zinc sprays or the use of Echinacea may shorten the course of your symptoms.   HOME CARE . Only  take medications as instructed by your medical team. . Be sure to drink plenty of fluids. Water is fine as well as fruit juices, sodas and electrolyte beverages. You may want to stay away from caffeine or alcohol. If you are nauseated, try taking small sips of liquids. How do you know if you are getting enough fluid? Your urine should be a pale yellow or almost colorless. . Get rest. . Taking a steamy shower or using a humidifier may help nasal congestion and ease sore throat pain. You can place a towel over your head and breathe in the steam from hot water coming from a faucet. . Using a saline nasal spray works much the same way. . Cough drops,  hard candies and sore throat lozenges may ease your cough. . Avoid close contacts especially the very young and the elderly . Cover your mouth if you cough or sneeze . Always remember to wash your hands.   GET HELP RIGHT AWAY IF: . You develop worsening fever. . If your symptoms do not improve within 10 days . You develop yellow or green discharge from your nose over 3 days. . You have coughing fits . You develop a severe head ache or visual changes. . You develop shortness of breath, difficulty breathing or start having chest pain . Your symptoms persist after you have completed your treatment plan  MAKE SURE YOU   Understand these instructions.  Will watch your condition.  Will get help right away if you are not doing well or get worse.  Your e-visit answers were reviewed by a board certified advanced clinical practitioner to complete your personal care plan. Depending upon the condition, your plan could have included both over the counter or prescription medications. Please review your pharmacy choice. If there is a problem, you may call our nursing hot line at and have the prescription routed to another pharmacy. Your safety is important to [place]. If you have drug allergies check your prescription carefully.   You can use MyChart to ask questions about today's visit, request a non-urgent call back, or ask for a work or school excuse for 24 hours related to this e-Visit. If it has been greater than 24 hours you will need to follow up with your provider, or enter a new e-Visit to address those concerns. You will get an e-mail in the next two days asking about your experience.  I hope that your e-visit has been valuable and will speed your recovery. Thank you for using e-visits.

## 2018-11-30 NOTE — Telephone Encounter (Signed)
email sent today from pt - she will try a e-visit.

## 2018-12-03 ENCOUNTER — Other Ambulatory Visit: Payer: Self-pay | Admitting: Physician Assistant

## 2018-12-03 ENCOUNTER — Telehealth (INDEPENDENT_AMBULATORY_CARE_PROVIDER_SITE_OTHER): Payer: BLUE CROSS/BLUE SHIELD | Admitting: Family Medicine

## 2018-12-03 ENCOUNTER — Other Ambulatory Visit: Payer: Self-pay

## 2018-12-03 ENCOUNTER — Ambulatory Visit: Payer: BLUE CROSS/BLUE SHIELD | Admitting: Family Medicine

## 2018-12-03 ENCOUNTER — Telehealth: Payer: Self-pay | Admitting: Family Medicine

## 2018-12-03 DIAGNOSIS — J189 Pneumonia, unspecified organism: Secondary | ICD-10-CM

## 2018-12-03 DIAGNOSIS — J329 Chronic sinusitis, unspecified: Secondary | ICD-10-CM

## 2018-12-03 DIAGNOSIS — J40 Bronchitis, not specified as acute or chronic: Secondary | ICD-10-CM

## 2018-12-03 MED ORDER — PSEUDOEPHEDRINE-GUAIFENESIN ER 60-600 MG PO TB12: 1.0000 | ORAL_TABLET | Freq: Two times a day (BID) | ORAL | 0 refills | Status: AC

## 2018-12-03 MED ORDER — BENZONATATE 200 MG PO CAPS: 200.0000 mg | ORAL_CAPSULE | Freq: Three times a day (TID) | ORAL | 0 refills | Status: DC | PRN

## 2018-12-03 MED ORDER — CEFUROXIME AXETIL 500 MG PO TABS: 500.0000 mg | ORAL_TABLET | Freq: Two times a day (BID) | ORAL | 0 refills | Status: AC

## 2018-12-03 NOTE — Progress Notes (Signed)
   Virtual Visit via telephone Note  I connected with Aimee [name] on 12/03/18 at 3:36 PM by telephone and verified that I am speaking with the correct person using two identifiers. Aimee [name] is currently located at home and husband is currently with her during visit. The provider, Claretta Fraise, MD is located in his office at time of visit.  I discussed the limitations, risks, security and privacy concerns of performing an evaluation and management service by telephone and the availability of in person appointments. I also discussed with the patient that there may be a patient responsible charge related to this service. The patient expressed understanding and agreed to proceed.  History of Present Illness  Really bad cough. Worst ever. Like pneumonia. No fever. Nonproductive. Onset one week ago. Had pressure in left chest. Progressed to HA and cough as well. Hard time breathing. Using borrowed nebulizer with albuterol. Lots of yellow rhinorrhea. Can't get a deep breath. She is a diabetic and hasn't been checking glucose. Denies sx of low glucose.    Assessment and Plan: 1. Sinobronchitis     Meds ordered this encounter  Medications  . cefUROXime (CEFTIN) 500 MG tablet    Sig: Take 1 tablet (500 mg total) by mouth 2 (two) times daily with a meal for 10 days.    Dispense:  20 tablet    Refill:  0  . benzonatate (TESSALON) 200 MG capsule    Sig: Take 1 capsule (200 mg total) by mouth 3 (three) times daily as needed for cough.    Dispense:  20 capsule    Refill:  0  . pseudoephedrine-guaifenesin (MUCINEX D) 60-600 MG 12 hr tablet    Sig: Take 1 tablet by mouth every 12 (twelve) hours for 10 days. As needed for congestion    Dispense:  20 tablet    Refill:  0     Follow Up Instructions:     I discussed the assessment and treatment plan with the patient. The patient was provided an opportunity to ask questions and all were answered. The patient agreed with the plan and  demonstrated an understanding of the instructions.   The patient was advised to call back or seek an in-person evaluation if the symptoms worsen or if the condition fails to improve as anticipated.  The above assessment and management plan was discussed with the patient. The patient verbalized understanding of and has agreed to the management plan. Patient is aware to call the clinic if symptoms persist or worsen. Patient is aware when to return to the clinic for a follow-up visit. Patient educated on when it is appropriate to go to the emergency department.    I provided 13 minutes of non-face-to-face time during this encounter. Visit ended at 3:49.    Claretta Fraise, MD

## 2018-12-03 NOTE — Telephone Encounter (Signed)
Televisit appt made.

## 2018-12-04 ENCOUNTER — Telehealth: Payer: BLUE CROSS/BLUE SHIELD | Admitting: Physician Assistant

## 2018-12-04 DIAGNOSIS — B9789 Other viral agents as the cause of diseases classified elsewhere: Principal | ICD-10-CM

## 2018-12-04 DIAGNOSIS — J069 Acute upper respiratory infection, unspecified: Secondary | ICD-10-CM

## 2018-12-04 NOTE — Progress Notes (Signed)
Based on what you shared with me, I feel your condition warrants further evaluation and I recommend that you contact your primary care for further information  Since you were evaluated by your primary care yesterday I recommend you reach back out to them for any further concerns.  This could potentially be coronavirus, but if symptoms remain mild, continue care as directed by your PCP and keep at home.  For any acute worsening of symptoms, specifically difficulty breathing, please go to the ER.   You will not be charged for this e-visit.

## 2018-12-05 ENCOUNTER — Other Ambulatory Visit: Payer: Self-pay | Admitting: Orthopedic Surgery

## 2018-12-05 DIAGNOSIS — R05 Cough: Secondary | ICD-10-CM | POA: Diagnosis not present

## 2018-12-05 DIAGNOSIS — M259 Joint disorder, unspecified: Secondary | ICD-10-CM

## 2018-12-06 ENCOUNTER — Encounter: Payer: Self-pay | Admitting: Family Medicine

## 2018-12-06 ENCOUNTER — Other Ambulatory Visit: Payer: Self-pay | Admitting: Family

## 2018-12-07 ENCOUNTER — Other Ambulatory Visit: Payer: Self-pay | Admitting: Family Medicine

## 2018-12-07 MED ORDER — PROMETHAZINE-DM 6.25-15 MG/5ML PO SYRP: 5.0000 mL | ORAL_SOLUTION | Freq: Four times a day (QID) | ORAL | 0 refills | Status: DC | PRN

## 2018-12-23 ENCOUNTER — Other Ambulatory Visit: Payer: Self-pay | Admitting: Family

## 2019-01-09 ENCOUNTER — Other Ambulatory Visit: Payer: Self-pay | Admitting: Family Medicine

## 2019-01-18 ENCOUNTER — Other Ambulatory Visit: Payer: Self-pay

## 2019-01-18 ENCOUNTER — Ambulatory Visit (INDEPENDENT_AMBULATORY_CARE_PROVIDER_SITE_OTHER): Payer: BLUE CROSS/BLUE SHIELD | Admitting: Family Medicine

## 2019-01-18 ENCOUNTER — Encounter: Payer: Self-pay | Admitting: Family Medicine

## 2019-01-18 DIAGNOSIS — M5416 Radiculopathy, lumbar region: Secondary | ICD-10-CM | POA: Diagnosis not present

## 2019-01-18 DIAGNOSIS — E1142 Type 2 diabetes mellitus with diabetic polyneuropathy: Secondary | ICD-10-CM | POA: Diagnosis not present

## 2019-01-18 DIAGNOSIS — I10 Essential (primary) hypertension: Secondary | ICD-10-CM | POA: Diagnosis not present

## 2019-01-18 DIAGNOSIS — E781 Pure hyperglyceridemia: Secondary | ICD-10-CM

## 2019-01-18 DIAGNOSIS — F4310 Post-traumatic stress disorder, unspecified: Secondary | ICD-10-CM

## 2019-01-18 DIAGNOSIS — F331 Major depressive disorder, recurrent, moderate: Secondary | ICD-10-CM

## 2019-01-18 MED ORDER — ARIPIPRAZOLE 5 MG PO TABS: 5.0000 mg | ORAL_TABLET | Freq: Every day | ORAL | 2 refills | Status: DC

## 2019-01-18 NOTE — Progress Notes (Signed)
Subjective:    Patient ID: Aimee [name], female    DOB: 25-Nov-1970, 48 y.o.   MRN: [id number]   HPI: Aimee [name] is a 48 y.o. female presenting for Blood pressure good. Continues meds.  Glucose "really good" goes low if missing snacks or meals  Had myelogram showing disc "leaking." Scheduled for MRI based  procedure on her back with Dr. Rolena Infante.  Asks me to taake over nucynta.   Having manic episodes. Up 2-3 days at a time. Spending money she doesn't have makes her excited.   Depression screen [facility] 2/9 06/20/2018 06/08/2018 06/04/2018 05/21/2018 02/21/2018  Decreased Interest 0 0 0 0 3  Down, Depressed, Hopeless 0 0 0 0 2  PHQ - 2 Score 0 0 0 0 5  Altered sleeping - - - - 3  Tired, decreased energy - - - - 3  Change in appetite - - - - 3  Feeling bad or failure about yourself  - - - - 2  Trouble concentrating - - - - 2  Moving slowly or fidgety/restless - - - - 3  Suicidal thoughts - - - - 1  PHQ-9 Score - - - - 22  Difficult doing work/chores - - - - -  Some recent data might be hidden     Relevant past medical, surgical, family and social history reviewed and updated as indicated.  Interim medical history since our last visit reviewed. Allergies and medications reviewed and updated.  ROS:  Review of Systems  Constitutional: Negative.   HENT: Negative for congestion.   Eyes: Negative for visual disturbance.  Respiratory: Negative for shortness of breath.   Cardiovascular: Negative for chest pain.  Gastrointestinal: Negative for abdominal pain, constipation, diarrhea, nausea and vomiting.  Genitourinary: Negative for difficulty urinating.  Musculoskeletal: Negative for arthralgias and myalgias.  Neurological: Negative for headaches.  Psychiatric/Behavioral: Negative for sleep disturbance.     Social History   Tobacco Use  Smoking Status Current Some Day Smoker  . Packs/day: 0.25  . Years: 23.00  . Pack years: 5.75  . Types: Cigarettes  Smokeless Tobacco  Never Used       Objective:     Wt Readings from Last 3 Encounters:  11/13/18 211 lb 12.8 oz (96.1 kg)  10/15/18 213 lb (96.6 kg)  07/20/18 207 lb (93.9 kg)     Exam deferred. Pt. Harboring due to COVID 19. Phone visit performed.   Assessment & Plan:   1. Type 2 diabetes mellitus with diabetic polyneuropathy, without long-term current use of insulin ([place])   2. Essential hypertension   3. Severe obesity (BMI >= 40) (HCC)   4. Lumbar radiculopathy   5. Hypertriglyceridemia   6. PTSD (post-traumatic stress disorder)   7. Moderate episode of recurrent major depressive disorder ([place])     Meds ordered this encounter  Medications  . ARIPiprazole (ABILIFY) 5 MG tablet    Sig: Take 1 tablet (5 mg total) by mouth daily.    Dispense:  30 tablet    Refill:  2    No orders of the defined types were placed in this encounter.     Diagnoses and all orders for this visit:  Type 2 diabetes mellitus with diabetic polyneuropathy, without long-term current use of insulin (HCC)  Essential hypertension  Severe obesity (BMI >= 40) (HCC)  Lumbar radiculopathy  Hypertriglyceridemia  PTSD (post-traumatic stress disorder)  Moderate episode of recurrent major depressive disorder ([place])  Other orders -     ARIPiprazole (  ABILIFY) 5 MG tablet; Take 1 tablet (5 mg total) by mouth daily.    Virtual Visit via telephone Note  I discussed the limitations, risks, security and privacy concerns of performing an evaluation and management service by telephone and the availability of in person appointments. The patient was identified with two identifiers. Pt.expressed understanding and agreed to proceed. Pt. Is at home. Dr. [name] is in his office.  Follow Up Instructions:   I discussed the assessment and treatment plan with the patient. The patient was provided an opportunity to ask questions and all were answered. The patient agreed with the plan and demonstrated an understanding of the  instructions.   The patient was advised to call back or seek an in-person evaluation if the symptoms worsen or if the condition fails to improve as anticipated.   Total minutes including chart review and phone contact time: 35   Follow up plan: Return in about 3 months (around 04/20/2019).  Claretta Fraise, MD [place]

## 2019-01-20 IMAGING — MR MR LUMBAR SPINE W/O CM
7 series · 48 of 48 positions shown · non-contrast
Comparison: Thoracic and lumbar radiographs 03/31/2017. Lumbar MRI
11/26/2016 and earlier.

CLINICAL DATA: 46-year-old female with lumbar back pain radiating
to the left hip and leg. Radiculopathy for 6 weeks despite
conservative treatment.

EXAM:
MRI LUMBAR SPINE WITHOUT CONTRAST
TECHNIQUE: Multiplanar, multisequence MR imaging of the lumbar spine was
performed. No intravenous contrast was administered.

[Series 3: T2 · sagittal · 4.0mm · 0.88mm/px · 4 of 12 slices shown (1 of 3)]
[im 1/12]
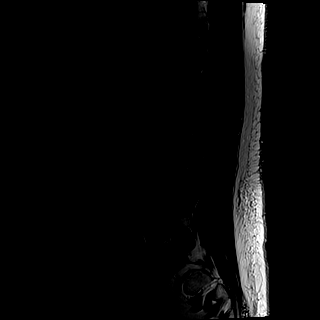
[im 4/12]
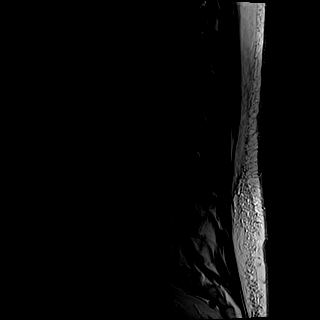
[im 8/12]
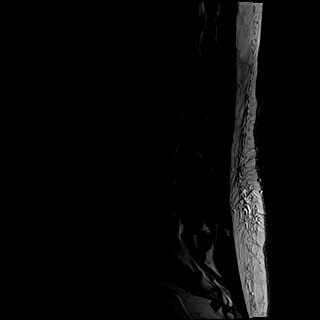
[im 12/12]
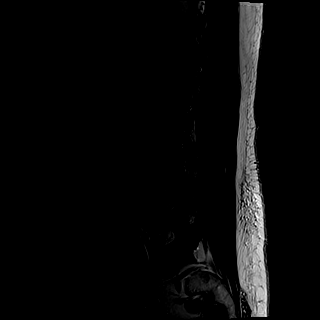

[Series 4: T1 · sagittal · 4.0mm · 0.88mm/px · 4 of 12 slices shown (1 of 3)]
[im 1/12]
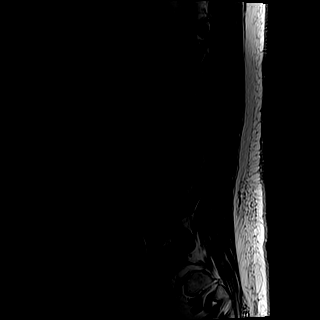
[im 4/12]
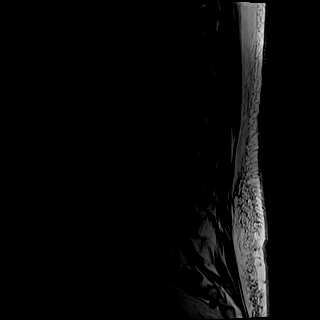
[im 8/12]
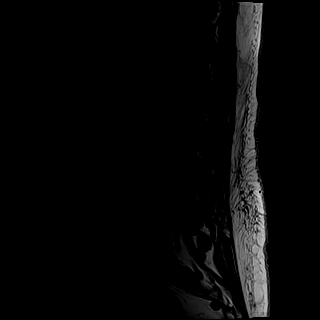
[im 12/12]
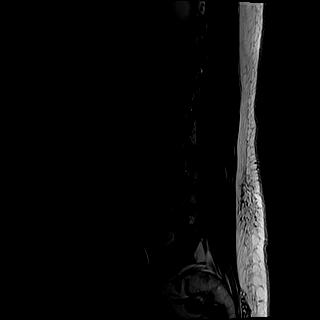

[Series 5: tirm sag · sagittal · 4.0mm · 0.55mm/px · 3 of 12 slices shown]
[im 1/12]
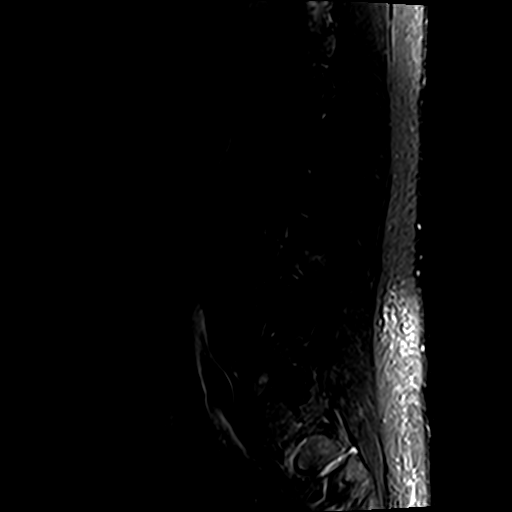
[im 6/12]
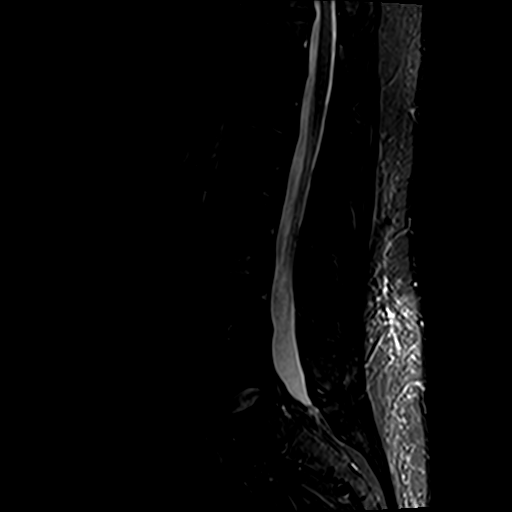
[im 12/12]
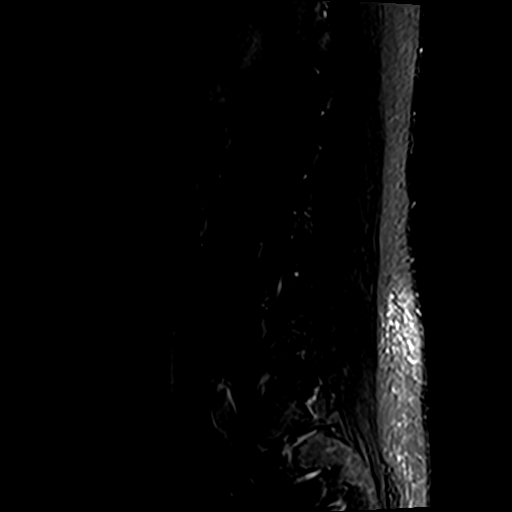

[Series 6: T1 · axial · 4.0mm · 0.78mm/px · z∈[-74,+147]mm · 10 of 38 slices shown (2 of 3)]
[im 1/38]
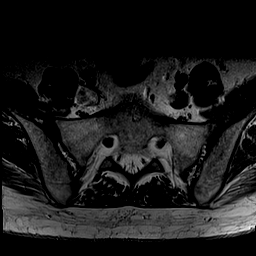
[im 5/38]
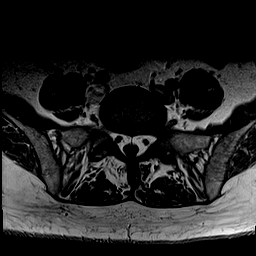
[im 9/38]
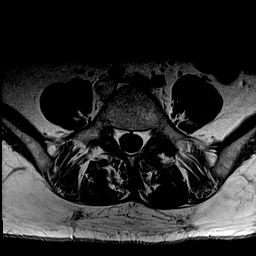
[im 13/38]
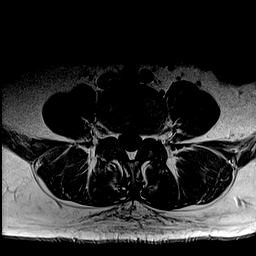
[im 17/38]
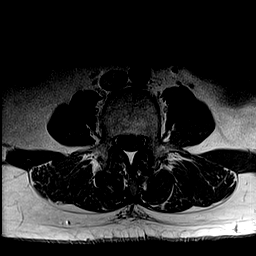
[im 21/38]
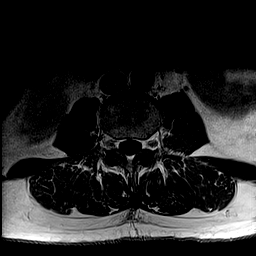
[im 25/38]
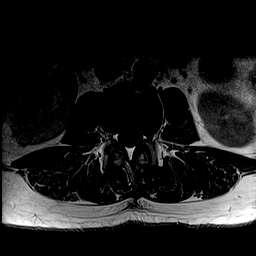
[im 29/38]
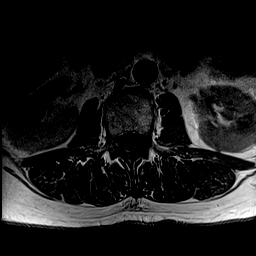
[im 33/38]
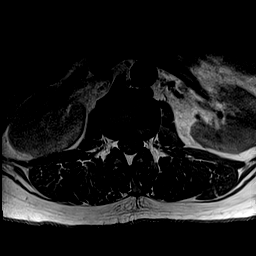
[im 38/38]
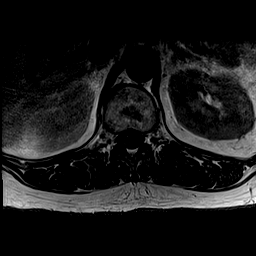

[Series 7: T2 · axial · 4.0mm · 0.78mm/px · z∈[-74,+147]mm · 7 of 26 slices shown (2 of 3)]
[im 1/26]
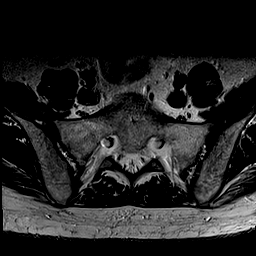
[im 5/26]
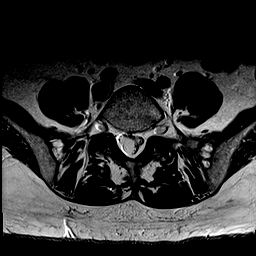
[im 9/26]
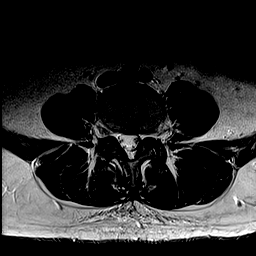
[im 13/26]
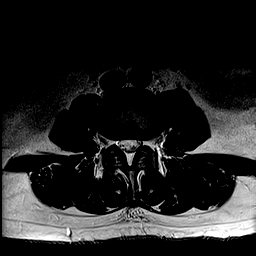
[im 17/26]
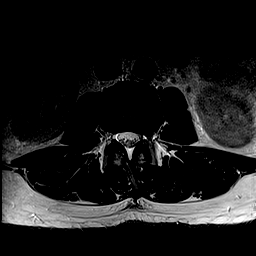
[im 21/26]
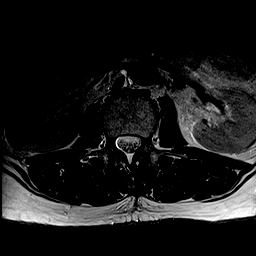
[im 26/26]
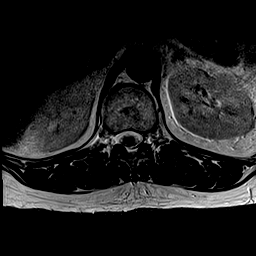

[Series 8: T1 · axial · 4.0mm · 0.78mm/px · z∈[-74,+147]mm · 10 of 38 slices shown (3 of 3)]
[im 1/38]
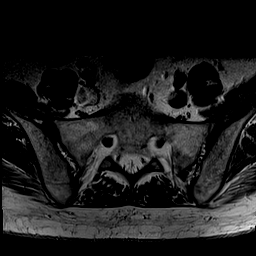
[im 5/38]
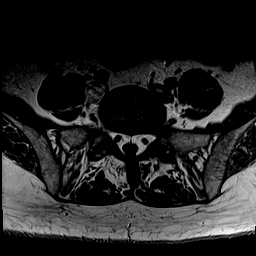
[im 9/38]
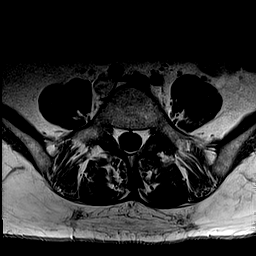
[im 13/38]
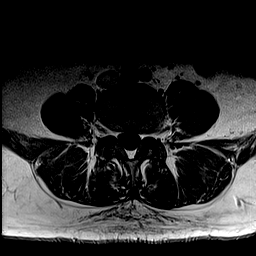
[im 17/38]
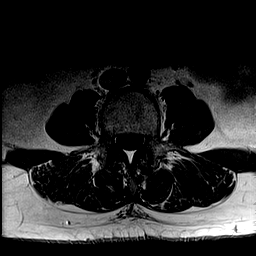
[im 21/38]
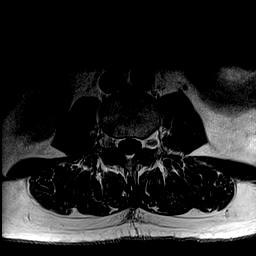
[im 25/38]
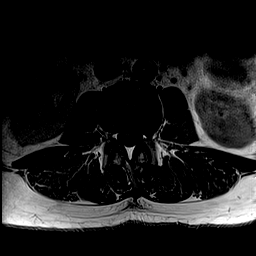
[im 29/38]
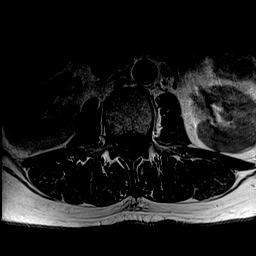
[im 33/38]
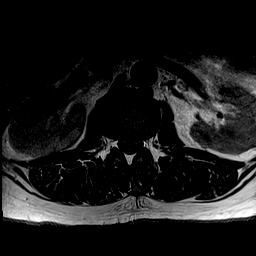
[im 38/38]
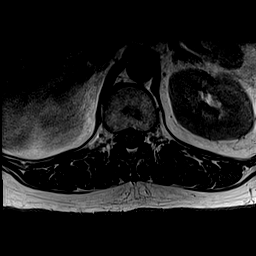

[Series 9: T2 · axial · 4.0mm · 0.78mm/px · z∈[-74,+147]mm · 10 of 38 slices shown (3 of 3)]
[im 1/38]
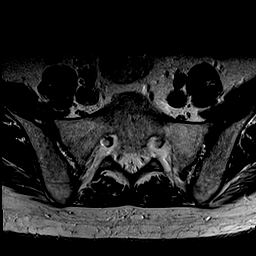
[im 5/38]
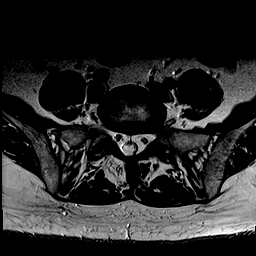
[im 9/38]
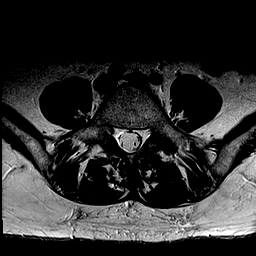
[im 13/38]
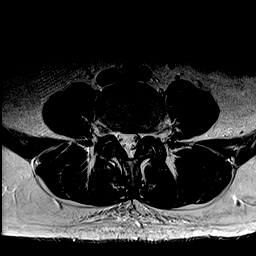
[im 17/38]
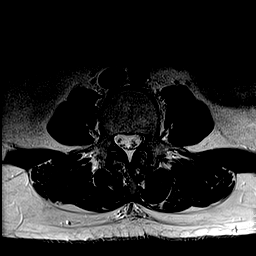
[im 21/38]
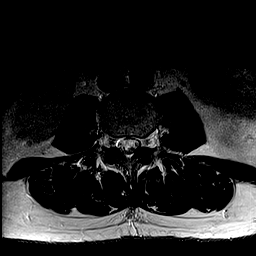
[im 25/38]
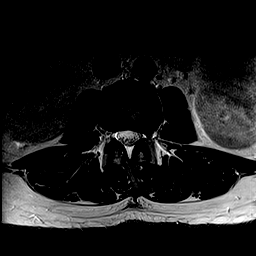
[im 29/38]
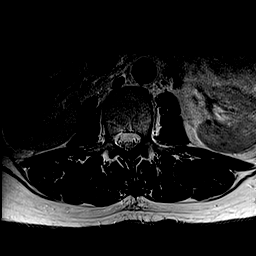
[im 33/38]
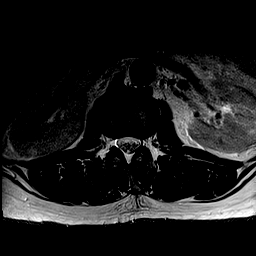
[im 38/38]
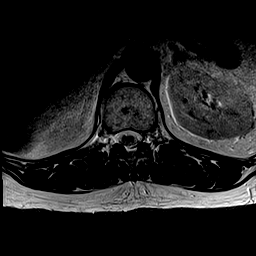

[48 of 48 positions shown; findings below may reference images not displayed]

FINDINGS: Segmentation: Normal thoracic and lumbar spine segmentation
demonstrated on the [facility] radiographs, which is the same numbering
system used on the [REDACTED] MRI.

Alignment:  Stable with mild straightening of lordosis.

Vertebrae: No marrow edema or evidence of acute osseous abnormality.
O Visualized bone marrow signal is within normal limits. Intact
visible sacrum and SI joints.

Conus medullaris: Extends to the L1 level and appears normal.

Paraspinal and other soft tissues: Visualized abdominal viscera and
paraspinal soft tissues are within normal limits.

Disc levels:

T11-T12: Stable mild disc bulge and facet hypertrophy. No
significant stenosis.

T12-L1:  Mild facet hypertrophy.

L1-L2:  Negative.

L2-L3:  Stable mild facet hypertrophy.  No stenosis.

L3-L4: Mild circumferential disc bulge. Mild facet hypertrophy. Mild
epidural lipomatosis. No significant stenosis. This level is stable.

L4-L5: Circumferential disc bulge. Moderate facet hypertrophy. No
spinal or convincing lateral recess stenosis. No convincing L4
foraminal stenosis ; a small left L4 foraminal component of disc
appears stable.

L5-S1: Mild to moderate facet hypertrophy. No stenosis. This level
is stable.
IMPRESSION: 1. Stable MRI appearance of the lumbar spine since November 2016. Small left foraminal component of disc at L4-L5 appears stable,
and without convincing foraminal stenosis. Query left L4
radiculitis.
3. No other significant lumbar disc degeneration. There is chronic
multilevel lumbar facet degeneration.

## 2019-01-24 DIAGNOSIS — Z79899 Other long term (current) drug therapy: Secondary | ICD-10-CM | POA: Diagnosis not present

## 2019-01-26 ENCOUNTER — Other Ambulatory Visit: Payer: Self-pay | Admitting: Family Medicine

## 2019-01-26 DIAGNOSIS — I700 Atherosclerosis of aorta: Secondary | ICD-10-CM

## 2019-01-28 ENCOUNTER — Encounter: Payer: Self-pay | Admitting: Orthopaedic Surgery

## 2019-01-29 NOTE — Telephone Encounter (Signed)
I called patient and scheduled appt for 01/30/2019 at 3pm.

## 2019-01-30 ENCOUNTER — Ambulatory Visit: Payer: Self-pay

## 2019-01-30 ENCOUNTER — Encounter: Payer: Self-pay | Admitting: Orthopaedic Surgery

## 2019-01-30 ENCOUNTER — Other Ambulatory Visit: Payer: Self-pay

## 2019-01-30 ENCOUNTER — Ambulatory Visit (INDEPENDENT_AMBULATORY_CARE_PROVIDER_SITE_OTHER): Payer: BLUE CROSS/BLUE SHIELD | Admitting: Orthopaedic Surgery

## 2019-01-30 VITALS — Ht 68.0 in | Wt 210.0 lb

## 2019-01-30 DIAGNOSIS — G8929 Other chronic pain: Secondary | ICD-10-CM | POA: Diagnosis not present

## 2019-01-30 DIAGNOSIS — M25512 Pain in left shoulder: Secondary | ICD-10-CM | POA: Diagnosis not present

## 2019-01-30 DIAGNOSIS — M542 Cervicalgia: Secondary | ICD-10-CM | POA: Diagnosis not present

## 2019-01-31 ENCOUNTER — Encounter: Payer: Self-pay | Admitting: Orthopaedic Surgery

## 2019-01-31 NOTE — Progress Notes (Signed)
Office Visit Note   Patient: Aimee [name]           Date of Birth: 05-14-1971           MRN: [id number] Visit Date: 01/30/2019              Requested by: Claretta Fraise, MD [place], [place] [address] PCP: Claretta Fraise, MD   Assessment & Plan: Visit Diagnoses:  1. Neck pain   2. Chronic left shoulder pain     Plan: With patient's chronic neck pain, bilateral upper extremity neuropathy since her last cervical spine surgery with Dr. Hal Neer recommend getting cervical MRI to rule out HNP/stenosis.  She is fused C3-C6.  In with ongoing shoulder impingement symptoms since injection October 2019 also recommend getting an MRI of the shoulder to rule out rotator cuff tear and other shoulder pathology.  Follow-up with Dr. Lorin Mercy after completion of both studies to discuss results and further treatment options.  Patient has failed conservative treatment also with oral NSAIDs, narcotic pain medication, muscle relaxers, lidocaine patches and gabapentin.  She is due to see Dr. Melina Schools for her lumbar spine issues.  Follow-Up Instructions: Return in about 3 weeks (around 02/20/2019) for Review cervical and left shoulder MRI scans.   Orders:  Orders Placed This Encounter  Procedures  . XR Cervical Spine 2 or 3 views  . MR Cervical Spine w/o contrast  . MR Shoulder Left w/o contrast   No orders of the defined types were placed in this encounter.     Procedures: No procedures performed   Clinical Data: No additional findings.   Subjective: Chief Complaint  Patient presents with  . Neck - Pain  . Left Shoulder - Pain    HPI 48 year old white female returns with complaints of neck pain, bilateral upper extremity neuropathy and left shoulder pain.  Patient is status post bilateral carpal tunnel releases by Dr. Lorin Mercy last year.  She did well from the surgery was but has continued to have chronic numbness and tingling in both hands.  Some pain radiating down left arm.  She  has had multiple cervical spine surgeries by Dr. Karie Chimera and is fused C3-C6.  She is had ongoing chronic neck pain.  She did have cervical spine MRI scan performed October 02, 2016 and that report showed:  EXAM: MRI CERVICAL SPINE WITHOUT CONTRAST  TECHNIQUE: Multiplanar, multisequence MR imaging of the cervical spine was performed. No intravenous contrast was administered.  COMPARISON:  10/28/2015 cervical MRI.  FINDINGS: Alignment: Physiologic.  Vertebrae: No fracture, evidence of discitis, or bone lesion. C3 through C6 anterior cervical discectomy and fusion. Susceptibility artifact from fusion hardware partially obscures the vertebral bodies at those levels. Left-sided C3 through C5 facet fusion.  Cord: No abnormal cord signal.  Posterior Fossa, vertebral arteries, paraspinal tissues: Mild maxillary sinus mucosal thickening. Partially empty sella turcica.  Disc levels:  C2-3: No significant disc displacement, foraminal narrowing, or canal stenosis.  C3-4: Left-greater-than-right facet hypertrophy with mild left foraminal narrowing. No significant canal stenosis.  C4-5: No significant disc displacement, foraminal narrowing, or canal stenosis.  C5-6: No significant disc displacement, foraminal narrowing, or canal stenosis.  C6-7: Left-greater-than-right facet arthrosis and minimal disc bulge. No significant foraminal narrowing or canal stenosis.  C7-T1: Prominent bilateral facet arthrosis with mild foraminal narrowing. Minimal disc bulge. No significant canal stenosis.  IMPRESSION: 1. No acute osseous abnormality or abnormal cord signal identified. 2. C3 through C6 anterior cervical discectomy and fusion. 3.  Stable cervical degenerative changes with predominantly facet hypertrophy at the C3-4, C6-7, and C7-T1 levels. No high-grade foraminal narrowing or canal stenosis.  Neck pain radiates into the left greater than right trapezius, scapula.   Left shoulder pain is aggravated with overhead activity and reaching behind her back.  She did have subacromial Marcaine/Depo-Medrol injection performed June 13, 2018 and this did give some relief temporarily.  Patient states that she has done some home exercises for the neck and shoulder.  Currently taking diclofenac, Nucynta, baclofen, lidocaine patches and gabapentin without much improvement.  She history of chronic lumbar spine issues and is scheduled to see Dr. Melina Schools for another imaging study next month and to discuss treatment options there.  Patient also has reported some feeling of bilateral leg weakness.       Review of Systems No pericardiac pulmonary GI GU issues  Objective: Vital Signs: Ht 5' 8"  (1.727 m)   Wt 210 lb (95.3 kg)   BMI 31.93 kg/m   Physical Exam HENT:     Head: Normocephalic and atraumatic.  Eyes:     Extraocular Movements: Extraocular movements intact.     Pupils: Pupils are equal, round, and reactive to light.  Pulmonary:     Effort: Pulmonary effort is normal. No respiratory distress.  Musculoskeletal:     Comments: Cervical spine She does have some limitation in range of motion due to previous fusion and stiffness.  Positive left brachial plexus, trapezius and scapular border tenderness.  Does have left trapezius spasm.  Left shoulder good range of motion below discomfort.  Positive impingement test.  Negative drop arm test.  Pain with supraspinatus resistance.  Does have positive Tinel's right wrist.  No focal motor deficit upper extremities.  Neurological:     Mental Status: She is alert and oriented to person, place, and time.  Psychiatric:        Mood and Affect: Mood normal.     Ortho Exam  Specialty Comments:  No specialty comments available.  Imaging: No results found.   PMFS History: Patient Active Problem List   Diagnosis Date Noted  . Aortic atherosclerosis ([place]) 05/22/2018  . Ganglion cyst of finger 09/13/2017  .  Status post placement of bone anchored hearing aid (BAHA) 08/14/2017  . Fibromyalgia 11/29/2016  . Lumbar radiculopathy 11/21/2016  . Essential hypertension 04/22/2016  . Severe obesity (BMI >= 40) ([place]) 10/08/2015  . PTSD (post-traumatic stress disorder) 09/10/2015  . Trapezius strain 08/19/2015  . Depression 08/19/2015  . Hypertriglyceridemia 11/12/2014  . Peripheral neuropathy 11/12/2014  . DDD (degenerative disc disease), lumbar 11/12/2014  . Varicose veins of leg with pain, bilateral 05/12/2014  . Diabetes ([place]) 03/05/2013  . Smoking 03/05/2013   Past Medical History:  Diagnosis Date  . Arthritis   . Depression   . Diabetes mellitus    type II   . Family history of adverse reaction to anesthesia    mother- mother has lots of alleriges   . GERD (gastroesophageal reflux disease)   . Hypertension   . Hypertriglyceridemia   . Neuromuscular disorder ([place])    diabetic neuropathy   . PONV (postoperative nausea and vomiting)   . PTSD (post-traumatic stress disorder)   . PTSD (post-traumatic stress disorder)     Family History  Problem Relation Age of Onset  . GI problems Mother   . Diabetes Father   . Breast cancer Maternal Aunt        in 90's  . Liver cancer Maternal Aunt   .  Colon cancer Maternal Grandmother   . Heart attack Maternal Grandfather   . Heart attack Paternal Grandmother   . Heart attack Paternal Grandfather   . Heart attack Paternal Uncle   . Lymphoma Paternal Uncle   . Bone cancer Cousin     Past Surgical History:  Procedure Laterality Date  . ABDOMINAL HYSTERECTOMY  2010  . ABDOMINOPLASTY  2006  . BREAST EXCISIONAL BIOPSY Right   . [place]   x2  . CHOLECYSTECTOMY N/A 01/11/2018   Procedure: LAPAROSCOPIC CHOLECYSTECTOMY;  Surgeon: [name], MD;  Location: [facility];  Service: General;  Laterality: N/A;  PT HAS PTSD AND IS AGGREGATED BY WAITING FOR LONG PERIODS OF TIME.  [facility] EAR SURGERY     12 surgeries on R ear starting in  1984; implant 06/2011  . left  carpal tunnel surgery     . LIVER BIOPSY N/A 01/11/2018   Procedure: LIVER BIOPSY;  Surgeon: [name], MD;  Location: [facility];  Service: General;  Laterality: N/A;  . SHOULDER ARTHROSCOPY Left 10/2015  . THORACIC [place] SURGERY  2011, 2012   x2  . TONSILLECTOMY  12/26/2017  . TUBAL LIGATION     Social History   Occupational History    Comment: [employer]  Tobacco Use  . Smoking status: Current Some Day Smoker    Packs/day: 0.25    Years: 23.00    Pack years: 5.75    Types: Cigarettes  . Smokeless tobacco: Never Used  Substance and Sexual Activity  . Alcohol use: No    Alcohol/week: 0.0 standard drinks  . Drug use: No  . Sexual activity: Yes    Partners: Male

## 2019-02-08 ENCOUNTER — Ambulatory Visit
Admission: RE | Admit: 2019-02-08 | Discharge: 2019-02-08 | Disposition: A | Discharge status: Home / Self Care | Payer: BLUE CROSS/BLUE SHIELD | Source: Ambulatory Visit | Attending: Orthopedic Surgery | Admitting: Orthopedic Surgery

## 2019-02-08 ENCOUNTER — Other Ambulatory Visit: Payer: Self-pay

## 2019-02-08 DIAGNOSIS — G8929 Other chronic pain: Secondary | ICD-10-CM | POA: Diagnosis not present

## 2019-02-08 DIAGNOSIS — M259 Joint disorder, unspecified: Secondary | ICD-10-CM

## 2019-02-08 DIAGNOSIS — M545 Low back pain: Secondary | ICD-10-CM | POA: Diagnosis not present

## 2019-02-08 DIAGNOSIS — G629 Polyneuropathy, unspecified: Secondary | ICD-10-CM | POA: Diagnosis not present

## 2019-02-08 DIAGNOSIS — G894 Chronic pain syndrome: Secondary | ICD-10-CM | POA: Diagnosis not present

## 2019-02-08 DIAGNOSIS — M461 Sacroiliitis, not elsewhere classified: Secondary | ICD-10-CM | POA: Diagnosis not present

## 2019-02-09 ENCOUNTER — Other Ambulatory Visit: Payer: Self-pay | Admitting: Family Medicine

## 2019-02-09 ENCOUNTER — Telehealth: Payer: Self-pay

## 2019-02-09 ENCOUNTER — Other Ambulatory Visit: Payer: BLUE CROSS/BLUE SHIELD

## 2019-02-09 DIAGNOSIS — Z20822 Contact with and (suspected) exposure to covid-19: Secondary | ICD-10-CM

## 2019-02-09 NOTE — Telephone Encounter (Signed)
Pt. was called; advised of possible exposure to COVID 19, while at recent [place] visit.  Stated she preferred to have test today; appt. Given for 2:00 PM, today, at the [facility].  Pt. Agreed with plan.  Pt. Prefers to have results called to her Cell phone, next week, as she will be traveling. [phone])

## 2019-02-11 DIAGNOSIS — M545 Low back pain: Secondary | ICD-10-CM | POA: Diagnosis not present

## 2019-02-11 LAB — NOVEL CORONAVIRUS, NAA: SARS-CoV-2, NAA: NOT DETECTED

## 2019-02-13 ENCOUNTER — Telehealth: Payer: Self-pay | Admitting: *Deleted

## 2019-02-13 NOTE — Telephone Encounter (Signed)
-----   Message from Claretta Fraise, MD sent at 02/13/2019  4:10 PM EDT ----- Regarding: test result. Please call & tell pt.  that her COVID test came back negative. Thanks, WS ----- Message ----- From: Wynona Luna, MD Sent: 02/13/2019   2:46 PM EDT To: Claretta Fraise, MD

## 2019-02-13 NOTE — Telephone Encounter (Signed)
Pt called and aware test is NEG

## 2019-02-15 ENCOUNTER — Encounter: Payer: Self-pay | Admitting: Orthopaedic Surgery

## 2019-02-18 DIAGNOSIS — M545 Low back pain: Secondary | ICD-10-CM | POA: Diagnosis not present

## 2019-02-19 ENCOUNTER — Ambulatory Visit: Payer: BLUE CROSS/BLUE SHIELD | Admitting: Orthopaedic Surgery

## 2019-02-21 ENCOUNTER — Ambulatory Visit
Admission: RE | Admit: 2019-02-21 | Discharge: 2019-02-21 | Disposition: A | Discharge status: Home / Self Care | Payer: BC Managed Care – PPO | Source: Ambulatory Visit | Attending: Surgery | Admitting: Surgery

## 2019-02-21 DIAGNOSIS — M542 Cervicalgia: Secondary | ICD-10-CM

## 2019-02-21 DIAGNOSIS — M67814 Other specified disorders of tendon, left shoulder: Secondary | ICD-10-CM | POA: Diagnosis not present

## 2019-02-21 DIAGNOSIS — M25512 Pain in left shoulder: Secondary | ICD-10-CM

## 2019-02-21 DIAGNOSIS — M7552 Bursitis of left shoulder: Secondary | ICD-10-CM | POA: Diagnosis not present

## 2019-02-21 DIAGNOSIS — G8929 Other chronic pain: Secondary | ICD-10-CM

## 2019-02-21 DIAGNOSIS — M4802 Spinal stenosis, cervical region: Secondary | ICD-10-CM | POA: Diagnosis not present

## 2019-02-27 ENCOUNTER — Ambulatory Visit (INDEPENDENT_AMBULATORY_CARE_PROVIDER_SITE_OTHER): Payer: BC Managed Care – PPO | Admitting: Orthopaedic Surgery

## 2019-02-27 ENCOUNTER — Other Ambulatory Visit: Payer: Self-pay

## 2019-02-27 ENCOUNTER — Encounter: Payer: Self-pay | Admitting: Orthopaedic Surgery

## 2019-02-27 DIAGNOSIS — M67912 Unspecified disorder of synovium and tendon, left shoulder: Secondary | ICD-10-CM | POA: Diagnosis not present

## 2019-02-27 DIAGNOSIS — Z981 Arthrodesis status: Secondary | ICD-10-CM

## 2019-02-27 NOTE — Progress Notes (Signed)
Office Visit Note   Patient: Aimee [name]           Date of Birth: 1971/05/13           MRN: [id number] Visit Date: 02/27/2019              Requested by: Claretta Fraise, MD [place],  [place] [address] PCP: Claretta Fraise, MD   Assessment & Plan: Visit Diagnoses:  1. Hx of fusion of cervical spine   2. Tendinopathy of rotator cuff, left     Plan: I reviewed shoulder MRI and cervical MRI scan with her and gave her copies of the report.  She did well when I previously did a carpal tunnel release.  She continues to have some discomfort left side of her neck and in her shoulder and has some rotator cuff tendinopathy.  At this point no operative treatment for her left shoulder or her cervical spine is recommended.  Fusions appear solid on x-ray and she does not have any adjacent level pathology.  She can return if she develops progressive symptoms.  Follow-Up Instructions: No follow-ups on file.   Orders:  No orders of the defined types were placed in this encounter.  No orders of the defined types were placed in this encounter.     Procedures: No procedures performed   Clinical Data: No additional findings.   Subjective: Chief Complaint  Patient presents with  . Neck - Pain, Follow-up    MRI Review  . Left Shoulder - Pain, Follow-up    MRI Review    HPI 48 year old female returns after MRI scan of her shoulder and cervical spine for continued problems with neck and left arm pain.  Patient had previous surgery by Dr. Hal Neer and is fused from C3-C6 after 2 procedures.  Pain on the left shoulder is is been uncomfortable and pain with motion that occurs usually with slight abduction.  Pain is uncomfortable.  It seems to be sometimes be worse when it rains.  At times states the pain is more severe.  Patient is been in pain management states she is off for few months now she is back on Nucynta 75 mg tablet.  Review of Systems 14 point systems positive for diabetes  history of smoking high triglycerides peripheral neuropathy carpal tunnel release PTSD previously morbidly obese now BMI 31.  Positive for hypertension disc degeneration, pain clinic patient previous cervical fusion lumbar facet injections otherwise negative is obtains HPI.   Objective: Vital Signs: Ht 5' 8"  (1.727 m)   Wt 210 lb (95.3 kg)   BMI 31.93 kg/m   Physical Exam Constitutional:      Appearance: She is well-developed.  HENT:     Head: Normocephalic.     Right Ear: External ear normal.     Left Ear: External ear normal.  Eyes:     Pupils: Pupils are equal, round, and reactive to light.  Neck:     Thyroid: No thyromegaly.     Trachea: No tracheal deviation.  Cardiovascular:     Rate and Rhythm: Normal rate.  Pulmonary:     Effort: Pulmonary effort is normal.  Abdominal:     Palpations: Abdomen is soft.  Skin:    General: Skin is warm and dry.  Neurological:     Mental Status: She is alert and oriented to person, place, and time.  Psychiatric:        Behavior: Behavior normal.     Ortho Exam exam demonstrates  some pain no audible clicking but discomfort with abduction and slight external rotation no subluxation.  Some prominence over the [facility] joint over the top of the [facility] joint.  Negative crossarm adduction test.  No tenderness long head biceps tendon.  Internal/external rotation and specific supraspinatus testing is strong.  Elbow reaches full extension of family reflexes are 2+ and symmetrical.  Well-healed cervical incision right side cervical spine.  Specialty Comments:  No specialty comments available.  Imaging: CLINICAL DATA:  Left shoulder pain.  EXAM: MRI OF THE LEFT SHOULDER WITHOUT CONTRAST  TECHNIQUE: Multiplanar, multisequence MR imaging of the shoulder was performed. No intravenous contrast was administered.  COMPARISON:  10/01/2015  FINDINGS: Rotator cuff: Mild tendinosis of the supraspinatus tendon. Infraspinatus tendon is intact. Teres  minor tendon is intact. Subscapularis tendon is intact.  Muscles: No atrophy or fatty replacement of nor abnormal signal within, the muscles of the rotator cuff.  Biceps long head: Mild tendinosis of the intra-articular portion of the long head of the biceps tendon.  Acromioclavicular Joint: Moderate arthropathy of the acromioclavicular joint. 15 mm fluid collection along the superior aspect of the acromioclavicular joint likely reflecting synovial outpouching from the joint or a ganglion cyst. Type I acromion. Small amount of subacromial/subdeltoid bursal fluid.  Glenohumeral Joint: No joint effusion.  No chondral defect.  Labrum: Grossly intact, but evaluation is limited by lack of intraarticular fluid.  Bones:  No acute osseous abnormality.  No aggressive osseous lesion.  Other: No fluid collection or hematoma.  IMPRESSION: 1. Mild tendinosis of the supraspinatus tendon. 2. Mild tendinosis of the intra-articular portion of the long head of the biceps tendon. 3. Mild subacromial/subdeltoid bursitis. 4. Moderate arthropathy of the acromioclavicular joint. 15 mm fluid collection along the superior aspect of the acromioclavicular joint likely reflecting synovial outpouching from the joint or a ganglion cyst.   Electronically Signed   By: Kathreen Devoid   On: 02/22/2019 08:37  CLINICAL DATA:  Initial evaluation for chronic neck and shoulder pain, left greater than right.  EXAM: MRI CERVICAL SPINE WITHOUT CONTRAST  TECHNIQUE: Multiplanar, multisequence MR imaging of the cervical spine was performed. No intravenous contrast was administered.  COMPARISON:  Prior radiograph from 01/30/2019 as well as previous MRI from 10/02/2016.  FINDINGS: Alignment: Mild straightening of the normal cervical lordosis. Trace anterolisthesis of C6 on C7 and C7 on T1, likely chronic and facet mediated.  Vertebrae: Susceptibility artifact from prior ACDF at C3-4 through  C5-6. Vertebral body heights maintained without evidence for acute or chronic fracture. Bone marrow signal intensity within normal limits. No discrete or worrisome osseous lesions. Mild reactive marrow edema about the right C2-3 facet due to facet arthritis (series 8, image 3).  Cord: Signal intensity within the cervical spinal cord is normal.  Posterior Fossa, vertebral arteries, paraspinal tissues: Visualized brain and posterior fossa within normal limits. Craniocervical junction normal. Paraspinous and prevertebral soft tissues are normal. Normal intravascular flow voids seen within the vertebral arteries bilaterally.  Disc levels:  C2-C3: Negative interspace. Right greater than left facet hypertrophy. Resultant mild right C3 foraminal narrowing. No significant left foraminal encroachment. Central canal widely patent.  C3-C4: Prior fusion. No residual spinal stenosis. Left greater than right facet hypertrophy with resultant mild left greater than right C4 foraminal narrowing. Appearance is stable.  C4-C5: Prior fusion. Left-sided facet hypertrophy. No residual spinal stenosis. Foramina remain patent.  C5-C6: Prior fusion. No residual spinal stenosis. Right greater than left facet hypertrophy with resultant mild right C6 foraminal narrowing.  C6-C7: Minimal disc bulge. Right greater than left facet degeneration. No significant canal or foraminal stenosis.  C7-T1: Minimal disc bulge. Bilateral facet degeneration. No significant canal or foraminal stenosis.  Visualized upper thoracic spine demonstrates no significant finding.  IMPRESSION: 1. Prior ACDF at C3 through C6 without residual spinal stenosis. 2. Multilevel facet degeneration, most notable at C2-3 on the right, C3-4 and C4-5 on the left, C6-7 on the right, and C7-T1 bilaterally. Resultant mild foraminal narrowing on the right at C3, bilateral C4, and on the right at C6.   Electronically Signed    By: Jeannine Boga M.D.   On: 02/21/2019 22:35 PMFS History: Patient Active Problem List   Diagnosis Date Noted  . Hx of fusion of cervical spine 02/27/2019  . Tendinopathy of rotator cuff, left 02/27/2019  . Aortic atherosclerosis ([place]) 05/22/2018  . Ganglion cyst of finger 09/13/2017  . Status post placement of bone anchored hearing aid (BAHA) 08/14/2017  . Fibromyalgia 11/29/2016  . Lumbar radiculopathy 11/21/2016  . Essential hypertension 04/22/2016  . Severe obesity (BMI >= 40) ([place]) 10/08/2015  . PTSD (post-traumatic stress disorder) 09/10/2015  . Trapezius strain 08/19/2015  . Depression 08/19/2015  . Hypertriglyceridemia 11/12/2014  . Peripheral neuropathy 11/12/2014  . DDD (degenerative disc disease), lumbar 11/12/2014  . Varicose veins of leg with pain, bilateral 05/12/2014  . Diabetes ([place]) 03/05/2013  . Smoking 03/05/2013   Past Medical History:  Diagnosis Date  . Arthritis   . Depression   . Diabetes mellitus    type II   . Family history of adverse reaction to anesthesia    mother- mother has lots of alleriges   . GERD (gastroesophageal reflux disease)   . Hypertension   . Hypertriglyceridemia   . Neuromuscular disorder ([place])    diabetic neuropathy   . PONV (postoperative nausea and vomiting)   . PTSD (post-traumatic stress disorder)   . PTSD (post-traumatic stress disorder)     Family History  Problem Relation Age of Onset  . GI problems Mother   . Diabetes Father   . Breast cancer Maternal Aunt        in 21's  . Liver cancer Maternal Aunt   . Colon cancer Maternal Grandmother   . Heart attack Maternal Grandfather   . Heart attack Paternal Grandmother   . Heart attack Paternal Grandfather   . Heart attack Paternal Uncle   . Lymphoma Paternal Uncle   . Bone cancer Cousin     Past Surgical History:  Procedure Laterality Date  . ABDOMINAL HYSTERECTOMY  2010  . ABDOMINOPLASTY  2006  . BREAST EXCISIONAL BIOPSY Right   . [place]   x2  . CHOLECYSTECTOMY N/A 01/11/2018   Procedure: LAPAROSCOPIC CHOLECYSTECTOMY;  Surgeon: [name], MD;  Location: [facility];  Service: General;  Laterality: N/A;  PT HAS PTSD AND IS AGGREGATED BY WAITING FOR LONG PERIODS OF TIME.  [facility] EAR SURGERY     12 surgeries on R ear starting in 1984; implant 06/2011  . left  carpal tunnel surgery     . LIVER BIOPSY N/A 01/11/2018   Procedure: LIVER BIOPSY;  Surgeon: [name], MD;  Location: [facility];  Service: General;  Laterality: N/A;  . SHOULDER ARTHROSCOPY Left 10/2015  . THORACIC [place] SURGERY  2011, 2012   x2  . TONSILLECTOMY  12/26/2017  . TUBAL LIGATION     Social History   Occupational History    Comment: [employer]  Tobacco Use  .  Smoking status: Current Some Day Smoker    Packs/day: 0.25    Years: 23.00    Pack years: 5.75    Types: Cigarettes  . Smokeless tobacco: Never Used  Substance and Sexual Activity  . Alcohol use: No    Alcohol/week: 0.0 standard drinks  . Drug use: No  . Sexual activity: Yes    Partners: Male

## 2019-03-01 ENCOUNTER — Encounter: Payer: Self-pay | Admitting: Family Medicine

## 2019-03-04 ENCOUNTER — Other Ambulatory Visit: Payer: Self-pay | Admitting: *Deleted

## 2019-03-04 MED ORDER — BLOOD GLUCOSE MONITOR KIT: PACK | 1 refills | Status: DC

## 2019-03-04 NOTE — Telephone Encounter (Signed)
Please send in as pt. Requests. Thanks, WS

## 2019-03-13 DIAGNOSIS — M545 Low back pain: Secondary | ICD-10-CM | POA: Diagnosis not present

## 2019-03-27 ENCOUNTER — Encounter: Payer: Self-pay | Admitting: Family Medicine

## 2019-03-29 ENCOUNTER — Ambulatory Visit (INDEPENDENT_AMBULATORY_CARE_PROVIDER_SITE_OTHER): Payer: BC Managed Care – PPO | Admitting: Family Medicine

## 2019-03-29 ENCOUNTER — Encounter: Payer: Self-pay | Admitting: Family Medicine

## 2019-03-29 DIAGNOSIS — F4310 Post-traumatic stress disorder, unspecified: Secondary | ICD-10-CM | POA: Diagnosis not present

## 2019-03-29 DIAGNOSIS — F331 Major depressive disorder, recurrent, moderate: Secondary | ICD-10-CM | POA: Diagnosis not present

## 2019-03-29 MED ORDER — LATUDA 20 MG PO TABS: ORAL_TABLET | ORAL | 0 refills | Status: DC

## 2019-03-29 NOTE — Progress Notes (Signed)
Virtual Visit via telephone Note  I connected with Aimee [name] on 03/29/19 at 1400 by telephone and verified that I am speaking with the correct person using two identifiers. Devinn [name] is currently located at home and no other people are currently with her during visit. The provider, Fransisca Kaufmann Airis Barbee, MD is located in their office at time of visit.  Call ended at 1415  I discussed the limitations, risks, security and privacy concerns of performing an evaluation and management service by telephone and the availability of in person appointments. I also discussed with the patient that there may be a patient responsible charge related to this service. The patient expressed understanding and agreed to proceed.   History and Present Illness: Patient is calling in with concerns about ptsd and depression.  Dr [name] her pcp recently added abilify about 2 months ago and she felt the abilify made everything worse and she tapered off of it 1 week ago.  She still feeling down and depressed.  She has never had suicidal ideations but has anxiety and irritability. She is currently on citalopram 40.  She says she will stay up for 3 days at a time sometimes without sleeping because of the energy she has an anxiety.  No diagnosis found.  Outpatient Encounter Medications as of 03/29/2019  Medication Sig  . albuterol (PROVENTIL) (2.5 MG/3ML) 0.083% nebulizer solution TAKE 3 MLS (2.5 MG TOTAL) BY NEBULIZER EVERY 6 HOURS AS NEEDED FOR WHEEZING OR SHORTNESS OF BREATH  . amLODipine (NORVASC) 5 MG tablet Take 1 tablet (5 mg total) by mouth daily.  . ARIPiprazole (ABILIFY) 5 MG tablet Take 1 tablet (5 mg total) by mouth daily.  Marland Kitchen aspirin 81 MG EC tablet TAKE 1 TABLET BY MOUTH EVERY DAY  . atorvastatin (LIPITOR) 40 MG tablet Take 1 tablet (40 mg total) by mouth daily.  . baclofen (LIORESAL) 10 MG tablet   . blood glucose meter kit and supplies KIT Use up to four times daily as directed. Dx.E11.9  . Blood  Glucose Monitoring Suppl (BLOOD GLUCOSE MONITOR SYSTEM) w/Device KIT 1 Device by Does not apply route 2 (two) times daily.  . citalopram (CELEXA) 40 MG tablet Take 1.5 tablets (60 mg total) by mouth daily. (Needs to be seen before next refill)  . diclofenac (VOLTAREN) 75 MG EC tablet Take 1 tablet (75 mg total) by mouth 2 (two) times daily.  . diclofenac sodium (VOLTAREN) 1 % GEL Apply 4 g topically 4 (four) times daily. As needed for neck and back pain  . ezetimibe (ZETIA) 10 MG tablet Take 1 tablet (10 mg total) by mouth daily.  . fenofibrate 160 MG tablet Take 1 tablet (160 mg total) by mouth daily. For cholesterol and triglyceride  . fluticasone (FLONASE) 50 MCG/ACT nasal spray Place 2 sprays into both nostrils daily.  Marland Kitchen gabapentin (NEURONTIN) 600 MG tablet TAKE 1 TABLET EVERY MORNING , 2 TABS IN THE AFTERNOON, AND TAKE 3 TABLETS AT BEDTIME  . glucose blood test strip Use as instructed  . Lancets MISC Use to check blood sugars twice daily  . lidocaine (LIDODERM) 5 % PLACE 1 PATCH ONTO THE SKIN DAILY. REMOVE & DISCARD PATCH WITHIN 12 HOURS OR AS DIRECTED BY MD  . metFORMIN (GLUCOPHAGE) 500 MG tablet Take 1 tablet (500 mg total) by mouth daily with breakfast.  . mometasone (ELOCON) 0.1 % lotion APPLY TOPICALLY DAILY. (Patient taking differently: Apply 1 application topically daily as needed (for skin irritation (ears)). )  . NARCAN 4  MG/0.1ML LIQD nasal spray kit USE 1 (ONE) SPRAY AS NEEDED FOR ACCIDENTAL OVERDOSE  . nystatin (MYCOSTATIN) 100000 UNIT/ML suspension TAKE 5 MLS (500,000 UNITS TOTAL) BY MOUTH 4 (FOUR) TIMES DAILY.  Marland Kitchen omega-3 acid ethyl esters (LOVAZA) 1 g capsule TAKE 2 CAPSULE 2 TIMES A DAY  . omeprazole (PRILOSEC) 20 MG capsule TAKE 1 CAPSULE DAILY  . promethazine-dextromethorphan (PROMETHAZINE-DM) 6.25-15 MG/5ML syrup Take 5 mLs by mouth 4 (four) times daily as needed for cough.  . Suvorexant (BELSOMRA) 10 MG TABS Take 10 mg by mouth at bedtime.  . tapentadol HCl (NUCYNTA) 75 MG  tablet Take 75 mg by mouth.   No facility-administered encounter medications on file as of 03/29/2019.     Review of Systems  Constitutional: Negative for chills and fever.  Eyes: Negative for visual disturbance.  Respiratory: Negative for chest tightness and shortness of breath.   Cardiovascular: Negative for chest pain and leg swelling.  Musculoskeletal: Negative for back pain and gait problem.  Skin: Negative for rash.  Neurological: Negative for light-headedness and headaches.  Psychiatric/Behavioral: Positive for decreased concentration, dysphoric mood and sleep disturbance. Negative for agitation, behavioral problems, self-injury and suicidal ideas. The patient is nervous/anxious.   All other systems reviewed and are negative.   Observations/Objective: Patient sounds comfortable and in no acute distress  Assessment and Plan: Problem List Items Addressed This Visit      Other   Depression   Relevant Medications   lurasidone (LATUDA) 20 MG TABS tablet   PTSD (post-traumatic stress disorder) - Primary   Relevant Medications   lurasidone (LATUDA) 20 MG TABS tablet      Patient tapered herself off of the Abilify because she did not like it, we will try the Latuda and taper up from 20 to 40-60 over the next 3 weeks Follow Up Instructions:  Follow up in 3-4 weeks with dr [name]   I discussed the assessment and treatment plan with the patient. The patient was provided an opportunity to ask questions and all were answered. The patient agreed with the plan and demonstrated an understanding of the instructions.   The patient was advised to call back or seek an in-person evaluation if the symptoms worsen or if the condition fails to improve as anticipated.  The above assessment and management plan was discussed with the patient. The patient verbalized understanding of and has agreed to the management plan. Patient is aware to call the clinic if symptoms persist or worsen. Patient is  aware when to return to the clinic for a follow-up visit. Patient educated on when it is appropriate to go to the emergency department.    I provided 15 minutes of non-face-to-face time during this encounter.    Worthy Rancher, MD

## 2019-04-05 ENCOUNTER — Encounter: Payer: Self-pay | Admitting: Family Medicine

## 2019-04-05 DIAGNOSIS — G629 Polyneuropathy, unspecified: Secondary | ICD-10-CM | POA: Diagnosis not present

## 2019-04-05 DIAGNOSIS — M545 Low back pain: Secondary | ICD-10-CM | POA: Diagnosis not present

## 2019-04-05 DIAGNOSIS — Z760 Encounter for issue of repeat prescription: Secondary | ICD-10-CM | POA: Diagnosis not present

## 2019-04-05 DIAGNOSIS — G8929 Other chronic pain: Secondary | ICD-10-CM | POA: Diagnosis not present

## 2019-04-05 DIAGNOSIS — Z79899 Other long term (current) drug therapy: Secondary | ICD-10-CM | POA: Diagnosis not present

## 2019-04-09 ENCOUNTER — Other Ambulatory Visit: Payer: Self-pay | Admitting: *Deleted

## 2019-04-12 DIAGNOSIS — R419 Unspecified symptoms and signs involving cognitive functions and awareness: Secondary | ICD-10-CM | POA: Diagnosis not present

## 2019-04-12 DIAGNOSIS — F0634 Mood disorder due to known physiological condition with mixed features: Secondary | ICD-10-CM | POA: Diagnosis not present

## 2019-04-13 DIAGNOSIS — M545 Low back pain: Secondary | ICD-10-CM | POA: Diagnosis not present

## 2019-04-19 ENCOUNTER — Other Ambulatory Visit: Payer: Self-pay | Admitting: *Deleted

## 2019-04-19 MED ORDER — AMLODIPINE BESYLATE 5 MG PO TABS: 5.0000 mg | ORAL_TABLET | Freq: Every day | ORAL | 0 refills | Status: DC

## 2019-04-19 MED ORDER — OMEPRAZOLE 20 MG PO CPDR: 20.0000 mg | DELAYED_RELEASE_CAPSULE | Freq: Every day | ORAL | 0 refills | Status: DC

## 2019-04-23 ENCOUNTER — Other Ambulatory Visit: Payer: Self-pay | Admitting: *Deleted

## 2019-04-23 ENCOUNTER — Telehealth: Payer: Self-pay | Admitting: *Deleted

## 2019-04-23 MED ORDER — FENOFIBRATE 160 MG PO TABS: 160.0000 mg | ORAL_TABLET | Freq: Every day | ORAL | 0 refills | Status: DC

## 2019-04-23 MED ORDER — EZETIMIBE 10 MG PO TABS: 10.0000 mg | ORAL_TABLET | Freq: Every day | ORAL | 0 refills | Status: DC

## 2019-04-23 MED ORDER — ATORVASTATIN CALCIUM 40 MG PO TABS: 40.0000 mg | ORAL_TABLET | Freq: Every day | ORAL | 0 refills | Status: DC

## 2019-04-23 NOTE — Telephone Encounter (Signed)
Prior Auth for Zetia 17m-APPROVED  Key: [id number]   PA Case ID: [id number]  Coverage Starts on: 04/23/2019 12:00:00 AM, Coverage Ends on: 04/22/2020 12:00:00 AM.  [employer] Form ([phone])

## 2019-05-13 ENCOUNTER — Ambulatory Visit (INDEPENDENT_AMBULATORY_CARE_PROVIDER_SITE_OTHER): Payer: BC Managed Care – PPO | Admitting: Family Medicine

## 2019-05-13 ENCOUNTER — Encounter: Payer: Self-pay | Admitting: Family Medicine

## 2019-05-13 DIAGNOSIS — F331 Major depressive disorder, recurrent, moderate: Secondary | ICD-10-CM | POA: Diagnosis not present

## 2019-05-13 DIAGNOSIS — F4310 Post-traumatic stress disorder, unspecified: Secondary | ICD-10-CM

## 2019-05-13 DIAGNOSIS — Z1211 Encounter for screening for malignant neoplasm of colon: Secondary | ICD-10-CM | POA: Diagnosis not present

## 2019-05-13 MED ORDER — ZIPRASIDONE HCL 20 MG PO CAPS: 20.0000 mg | ORAL_CAPSULE | Freq: Two times a day (BID) | ORAL | 2 refills | Status: DC

## 2019-05-13 NOTE — Progress Notes (Signed)
Subjective:    Patient ID: Aimee [name], female    DOB: 1971-09-12, 48 y.o.   MRN: [id number]   HPI: Aimee [name] is a 48 y.o. female presenting for recheck of PTSD mood swings. Up and down made worse by Abilify.Has only been on Celexa since tapering herself off of Abilify about 6 weeks ago. Having rage. "I can't stand myself, much less anyone else." Annoyed by everything. Has seen a counselor in [place] for 4-5 months, weekly last year. Quit because she listened but didn't give much advice. Has not seen psychiatry. She says she felt homicidal at times when she was taking Abilify. That has gone, but she is still angry and agitated.   Pt. Is concerned that she has two relatives that have had colon cancer in their early  41's.    Depression screen [facility] 2/9 06/20/2018 06/08/2018 06/04/2018 05/21/2018 02/21/2018  Decreased Interest 0 0 0 0 3  Down, Depressed, Hopeless 0 0 0 0 2  PHQ - 2 Score 0 0 0 0 5  Altered sleeping - - - - 3  Tired, decreased energy - - - - 3  Change in appetite - - - - 3  Feeling bad or failure about yourself  - - - - 2  Trouble concentrating - - - - 2  Moving slowly or fidgety/restless - - - - 3  Suicidal thoughts - - - - 1  PHQ-9 Score - - - - 22  Difficult doing work/chores - - - - -  Some recent data might be hidden     Relevant past medical, surgical, family and social history reviewed and updated as indicated.  Interim medical history since our last visit reviewed. Allergies and medications reviewed and updated.  ROS:  Review of Systems  Constitutional: Negative.   HENT: Negative for congestion.   Eyes: Negative for visual disturbance.  Respiratory: Negative for shortness of breath.   Cardiovascular: Negative for chest pain.  Gastrointestinal: Negative for abdominal pain, constipation, diarrhea, nausea and vomiting.  Genitourinary: Negative for difficulty urinating.  Musculoskeletal: Negative for arthralgias and myalgias.  Neurological: Negative for  headaches.  Psychiatric/Behavioral: Positive for agitation, decreased concentration, dysphoric mood and sleep disturbance. The patient is nervous/anxious.      Social History   Tobacco Use  Smoking Status Current Some Day Smoker  . Packs/day: 0.25  . Years: 23.00  . Pack years: 5.75  . Types: Cigarettes  Smokeless Tobacco Never Used       Objective:     Wt Readings from Last 3 Encounters:  02/27/19 210 lb (95.3 kg)  01/30/19 210 lb (95.3 kg)  11/13/18 211 lb 12.8 oz (96.1 kg)     Exam deferred. Pt. Harboring due to COVID 19. Phone visit performed.   Assessment & Plan:   1. PTSD (post-traumatic stress disorder)   2. Moderate episode of recurrent major depressive disorder ([place])   3. Screen for colon cancer     Meds ordered this encounter  Medications  . ziprasidone (GEODON) 20 MG capsule    Sig: Take 1 capsule (20 mg total) by mouth 2 (two) times daily with a meal.    Dispense:  60 capsule    Refill:  2    Orders Placed This Encounter  Procedures  . Ambulatory referral to Gastroenterology    Referral Priority:   Routine    Referral Type:   Consultation    Referral Reason:   Specialty Services Required    Number of Visits  Requested:   1  . Ambulatory referral to Psychiatry    Referral Priority:   Routine    Referral Type:   Psychiatric    Referral Reason:   Specialty Services Required    Requested Specialty:   Psychiatry    Number of Visits Requested:   1      Diagnoses and all orders for this visit:  PTSD (post-traumatic stress disorder) -     Ambulatory referral to Psychiatry  Moderate episode of recurrent major depressive disorder ([place]) -     Ambulatory referral to Psychiatry  Screen for colon cancer -     Ambulatory referral to Gastroenterology  Other orders -     ziprasidone (GEODON) 20 MG capsule; Take 1 capsule (20 mg total) by mouth 2 (two) times daily with a meal.    Virtual Visit via telephone Note  I discussed the limitations, risks,  security and privacy concerns of performing an evaluation and management service by telephone and the availability of in person appointments. The patient was identified with two identifiers. Pt.expressed understanding and agreed to proceed. Pt. Is at home. Dr. [name] is in his office.  Follow Up Instructions:   I discussed the assessment and treatment plan with the patient. The patient was provided an opportunity to ask questions and all were answered. The patient agreed with the plan and demonstrated an understanding of the instructions.   The patient was advised to call back or seek an in-person evaluation if the symptoms worsen or if the condition fails to improve as anticipated.   Total minutes including chart review and phone contact time: 35   Follow up plan: Return in about 2 weeks (around 05/27/2019).  Aimee Fraise, MD [place]

## 2019-05-14 ENCOUNTER — Other Ambulatory Visit: Payer: Self-pay | Admitting: Family Medicine

## 2019-05-14 DIAGNOSIS — G629 Polyneuropathy, unspecified: Secondary | ICD-10-CM | POA: Diagnosis not present

## 2019-05-14 DIAGNOSIS — Z760 Encounter for issue of repeat prescription: Secondary | ICD-10-CM | POA: Diagnosis not present

## 2019-05-14 DIAGNOSIS — Z79899 Other long term (current) drug therapy: Secondary | ICD-10-CM | POA: Diagnosis not present

## 2019-05-14 DIAGNOSIS — M545 Low back pain: Secondary | ICD-10-CM | POA: Diagnosis not present

## 2019-05-14 DIAGNOSIS — G894 Chronic pain syndrome: Secondary | ICD-10-CM | POA: Diagnosis not present

## 2019-05-14 MED ORDER — CYANOCOBALAMIN 1000 MCG/ML IJ SOLN: 1000.0000 ug | INTRAMUSCULAR | 3 refills | Status: DC

## 2019-05-14 MED ORDER — PHENTERMINE HCL 37.5 MG PO CAPS: 37.5000 mg | ORAL_CAPSULE | ORAL | 2 refills | Status: DC

## 2019-05-25 ENCOUNTER — Other Ambulatory Visit: Payer: Self-pay | Admitting: Family Medicine

## 2019-05-30 ENCOUNTER — Other Ambulatory Visit (INDEPENDENT_AMBULATORY_CARE_PROVIDER_SITE_OTHER): Payer: BC Managed Care – PPO

## 2019-05-30 ENCOUNTER — Encounter: Payer: Self-pay | Admitting: Physician Assistant

## 2019-05-30 ENCOUNTER — Ambulatory Visit: Payer: BC Managed Care – PPO | Admitting: Physician Assistant

## 2019-05-30 VITALS — BP 142/88 | HR 106 | Temp 98.6°F | Ht 68.0 in | Wt 202.1 lb

## 2019-05-30 DIAGNOSIS — R1084 Generalized abdominal pain: Secondary | ICD-10-CM | POA: Diagnosis not present

## 2019-05-30 DIAGNOSIS — R197 Diarrhea, unspecified: Secondary | ICD-10-CM | POA: Diagnosis not present

## 2019-05-30 DIAGNOSIS — R109 Unspecified abdominal pain: Secondary | ICD-10-CM

## 2019-05-30 DIAGNOSIS — K760 Fatty (change of) liver, not elsewhere classified: Secondary | ICD-10-CM

## 2019-05-30 LAB — HEPATIC FUNCTION PANEL
ALT: 47 U/L — ABNORMAL HIGH (ref 0–35)
AST: 21 U/L (ref 0–37)
Albumin: 4.4 g/dL (ref 3.5–5.2)
Alkaline Phosphatase: 51 U/L (ref 39–117)
Bilirubin, Direct: 0 mg/dL (ref 0.0–0.3)
Total Bilirubin: 0.4 mg/dL (ref 0.2–1.2)
Total Protein: 7.3 g/dL (ref 6.0–8.3)

## 2019-05-30 MED ORDER — NA SULFATE-K SULFATE-MG SULF 17.5-3.13-1.6 GM/177ML PO SOLN: 1.0000 | Freq: Once | ORAL | 0 refills | Status: AC

## 2019-05-30 MED ORDER — DICYCLOMINE HCL 10 MG PO CAPS: ORAL_CAPSULE | ORAL | 3 refills | Status: DC

## 2019-05-30 NOTE — Progress Notes (Signed)
Subjective:    Patient ID: Aimee [name], female    DOB: 1970/10/08, 48 y.o.   MRN: [id number]  HPI [name] is a pleasant 48 year old white female, known to Dr. Russella Dar and myself.  She comes in today with complaints of diarrhea and crampy abdominal pain. Patient has a history of hypertension, peripheral neuropathy, PTSD, depression, she is status post cholecystectomy, and underwent work-up last summer including liver biopsy for fatty liver.  Liver biopsy showed mild steatohepatitis with minimal fibrosis, no increased iron stores, and no evidence of autoimmune liver disease. He says she has been having ongoing problems for the past year to year and a half with pain and spasms in the right side of her abdomen is well is intermittent lower abdominal cramping and pain.  He does not feel that her symptoms are any worse after cholecystectomy.  She also has very frequent bowel movements with at least 3-4 loose stools per day.  She says she very rarely has a formed stool.  She has not noticed any melena or hematochezia.  She has noticed that certain foods including broccoli and lettuce will trigger her symptoms, she is not aware of lactose intolerance.  Denies any artificial sweeteners. She does take Voltaren 1 tablet daily chronically for back pain.  She is also on metformin but says she has been on metformin for multiple years at the same dose.  She is currently taking phentermine because she had gained about 35 pounds after requiring steroids earlier this year. She worries about colon cancer, she does have 2 grandmothers who had cancer one with renal cancer, she is not certain of type of cancer her other grandmother had.  Review of Systems Pertinent positive and negative review of systems were noted in the above HPI section.  All other review of systems was otherwise negative.  Outpatient Encounter Medications as of 05/30/2019  Medication Sig  . albuterol (PROVENTIL) (2.5 MG/3ML) 0.083% nebulizer solution  TAKE 3 MLS (2.5 MG TOTAL) BY NEBULIZER EVERY 6 HOURS AS NEEDED FOR WHEEZING OR SHORTNESS OF BREATH  . amLODipine (NORVASC) 5 MG tablet Take 1 tablet (5 mg total) by mouth daily.  Marland Kitchen aspirin 81 MG EC tablet TAKE 1 TABLET BY MOUTH EVERY DAY  . atorvastatin (LIPITOR) 40 MG tablet Take 1 tablet (40 mg total) by mouth daily.  . baclofen (LIORESAL) 10 MG tablet   . blood glucose meter kit and supplies KIT Use up to four times daily as directed. Dx.E11.9  . Blood Glucose Monitoring Suppl (BLOOD GLUCOSE MONITOR SYSTEM) w/Device KIT 1 Device by Does not apply route 2 (two) times daily.  . citalopram (CELEXA) 40 MG tablet Take 1.5 tablets (60 mg total) by mouth daily. (Needs to be seen before next refill)  . cyanocobalamin (,VITAMIN B-12,) 1000 MCG/ML injection Inject 1 mL (1,000 mcg total) into the muscle every 30 (thirty) days.  . diclofenac (VOLTAREN) 75 MG EC tablet Take 1 tablet (75 mg total) by mouth 2 (two) times daily.  . diclofenac sodium (VOLTAREN) 1 % GEL Apply 4 g topically 4 (four) times daily. As needed for neck and back pain  . ezetimibe (ZETIA) 10 MG tablet Take 1 tablet (10 mg total) by mouth daily.  . fenofibrate 160 MG tablet Take 1 tablet (160 mg total) by mouth daily. For cholesterol and triglyceride  . fluticasone (FLONASE) 50 MCG/ACT nasal spray Place 2 sprays into both nostrils daily.  Marland Kitchen gabapentin (NEURONTIN) 600 MG tablet TAKE 1 TABLET EVERY MORNING , 2 TABS IN  THE AFTERNOON, AND TAKE 3 TABLETS AT BEDTIME  . glucose blood test strip Use as instructed  . Lancets MISC Use to check blood sugars twice daily  . lidocaine (LIDODERM) 5 % PLACE 1 PATCH ONTO THE SKIN DAILY. REMOVE & DISCARD PATCH WITHIN 12 HOURS OR AS DIRECTED BY MD  . metFORMIN (GLUCOPHAGE) 500 MG tablet Take 1 tablet (500 mg total) by mouth daily with breakfast.  . mometasone (ELOCON) 0.1 % lotion APPLY TOPICALLY DAILY. (Patient taking differently: Apply 1 application topically daily as needed (for skin irritation (ears)).  )  . NARCAN 4 MG/0.1ML LIQD nasal spray kit USE 1 (ONE) SPRAY AS NEEDED FOR ACCIDENTAL OVERDOSE  . nystatin (MYCOSTATIN) 100000 UNIT/ML suspension TAKE 5 MLS (500,000 UNITS TOTAL) BY MOUTH 4 (FOUR) TIMES DAILY.  Marland Kitchen omega-3 acid ethyl esters (LOVAZA) 1 g capsule TAKE 2 CAPSULE 2 TIMES A DAY  . omeprazole (PRILOSEC) 20 MG capsule Take 1 capsule (20 mg total) by mouth daily.  . phentermine 37.5 MG capsule Take 1 capsule (37.5 mg total) by mouth every morning.  . promethazine-dextromethorphan (PROMETHAZINE-DM) 6.25-15 MG/5ML syrup Take 5 mLs by mouth 4 (four) times daily as needed for cough.  . Suvorexant (BELSOMRA) 10 MG TABS Take 10 mg by mouth at bedtime.  . tapentadol HCl (NUCYNTA) 75 MG tablet Take 75 mg by mouth.  . ziprasidone (GEODON) 20 MG capsule Take 1 capsule (20 mg total) by mouth 2 (two) times daily with a meal.  . dicyclomine (BENTYL) 10 MG capsule Take one capsule three times a day as needed for abdominal cramping and diarrhea  . Na Sulfate-K Sulfate-Mg Sulf 17.5-3.13-1.6 GM/177ML SOLN Take 1 kit by mouth once for 1 dose.   No facility-administered encounter medications on file as of 05/30/2019.    Allergies  Allergen Reactions  . Hydrocodone Itching and Other (See Comments)    And family of drugs   . Oxycodone Itching  . Tramadol Itching  . Mirtazapine Other (See Comments)    xs hunger, foot tapping idiosynchronously  . Percocet [Oxycodone-Acetaminophen]   . Darvocet [Propoxyphene N-Acetaminophen] Itching   Patient Active Problem List   Diagnosis Date Noted  . Hx of fusion of cervical spine 02/27/2019  . Tendinopathy of rotator cuff, left 02/27/2019  . Aortic atherosclerosis (HCC) 05/22/2018  . Ganglion cyst of finger 09/13/2017  . Status post placement of bone anchored hearing aid (BAHA) 08/14/2017  . Fibromyalgia 11/29/2016  . Lumbar radiculopathy 11/21/2016  . Essential hypertension 04/22/2016  . Severe obesity (BMI >= 40) (HCC) 10/08/2015  . PTSD (post-traumatic  stress disorder) 09/10/2015  . Trapezius strain 08/19/2015  . Depression 08/19/2015  . Hypertriglyceridemia 11/12/2014  . Peripheral neuropathy 11/12/2014  . DDD (degenerative disc disease), lumbar 11/12/2014  . Varicose veins of leg with pain, bilateral 05/12/2014  . Diabetes (HCC) 03/05/2013  . Smoking 03/05/2013   Social History   Socioeconomic History  . Marital status: Married    Spouse name: Genevie Cheshire  . Number of children: 3  . Years of education: 110  . Highest education level: Not on file  Occupational History    Comment: [employer]  Social Needs  . Financial resource strain: Not on file  . Food insecurity    Worry: Not on file    Inability: Not on file  . Transportation needs    Medical: Not on file    Non-medical: Not on file  Tobacco Use  . Smoking status: Current Every Day Smoker    Packs/day: 0.25  Years: 23.00    Pack years: 5.75    Types: Cigarettes  . Smokeless tobacco: Never Used  Substance and Sexual Activity  . Alcohol use: No    Alcohol/week: 0.0 standard drinks  . Drug use: No  . Sexual activity: Yes    Partners: Male  Lifestyle  . Physical activity    Days per week: Not on file    Minutes per session: Not on file  . Stress: Not on file  Relationships  . Social Musician on phone: Not on file    Gets together: Not on file    Attends religious service: Not on file    Active member of club or organization: Not on file    Attends meetings of clubs or organizations: Not on file    Relationship status: Not on file  . Intimate partner violence    Fear of current or ex partner: Not on file    Emotionally abused: Not on file    Physically abused: Not on file    Forced sexual activity: Not on file  Other Topics Concern  . Not on file  Social History Narrative   Lives with spouse at home   Caffeine use- coffee, 1-2 cups daily    Ms. Tinnel's family history includes Bone cancer in her cousin; Breast cancer in her maternal aunt;  Colon cancer in her maternal grandmother; Diabetes in her father; GI problems in her mother; Heart attack in her maternal grandfather, paternal grandfather, paternal grandmother, and paternal uncle; Liver cancer in her maternal aunt; Lymphoma in her paternal uncle.      Objective:    Vitals:   05/30/19 1504  BP: (!) 142/88  Pulse: (!) 106  Temp: 98.6 F (37 C)    Physical Exam Well-developed well-nourished white female in no acute distress.  Height, Weight 202, BMI 30.7  HEENT; nontraumatic normocephalic, EOMI, PER R LA, sclera anicteric. Oropharynx; not examined/mass/COVID Neck; supple, no JVD Cardiovascular; regular rate and rhythm with S1-S2, no murmur rub or gallop Pulmonary; Clear bilaterally Abdomen; soft, mild tenderness right upper and right mid quadrant, nondistended, no palpable mass or hepatosplenomegaly, bowel sounds are active Rectal; not done today Skin; benign exam, no jaundice rash or appreciable lesions Extremities; no clubbing cyanosis or edema skin warm and dry Neuro/Psych; alert and oriented x4, grossly nonfocal mood and affect appropriate       Assessment & Plan:   #90 48 year old white female with 1 to 1-1/2-year history of persistent diarrhea with 3-4 bowel movements per day, and associated rather generalized abdominal discomfort and cramping times more right-sided. Etiology of symptoms is not clear, this may all be secondary to IBS.  She says her symptoms predated cholecystectomy.  She does take metformin but also says she has been on stable doses for many years.  Patient is on chronic Voltaren. Rule out underlying IBD, rule out microscopic colitis i.e. chronic NSAIDs.  Rule out occult colon lesion.  #2 nonalcoholic fatty liver disease.  Patient had liver biopsy 2019 with mild steatohepatitis and minimal fibrosis.  No evidence for autoimmune disease, hemochromatosis.  #3 history of peripheral neuropathy 4.  Hypertension 5.  PTSD/depression 6.  Status  post cholecystectomy 7.  Adult onset diabetes mellitus 8.  Chronic opioid use  Plan; check hepatic panel today.  Patient should also have ultrasound with elastography at some point in the next year Start Levsin sublingual every 4 to 6 hours PRN Scheduled for colonoscopy with Dr. Russella Dar.  Procedure was  discussed in detail with the patient including indications risks and benefits and she is agreeable to proceed.  Patient should have random biopsies done if mucosa normal to rule out microscopic colitis. Further plans pending findings at colonoscopy.   Karenann Mcgrory Oswald Hillock PA-C 05/30/2019   Cc: Mechele Claude, MD

## 2019-05-30 NOTE — Patient Instructions (Signed)
Your provider has requested that you go to the basement level for lab work before leaving today. Press "B" on the elevator. The lab is located at the first door on the left as you exit the elevator.  We have sent the following medications to your pharmacy for you to pick up at your convenience:  Bentyl  You have been scheduled for a colonoscopy. Please follow written instructions given to you at your visit today.  Please pick up your prep supplies at the pharmacy within the next 1-3 days. If you use inhalers (even only as needed), please bring them with you on the day of your procedure. Your physician has requested that you go to www.startemmi.com and enter the access code given to you at your visit today. This web site gives a general overview about your procedure. However, you should still follow specific instructions given to you by our office regarding your preparation for the procedure.   Avoid known food triggers

## 2019-05-31 NOTE — Progress Notes (Signed)
Reviewed and agree with management plan.  Duff Pozzi T. Tashonda Pinkus, MD FACG [facility]  

## 2019-06-03 ENCOUNTER — Encounter: Payer: Self-pay | Admitting: *Deleted

## 2019-06-03 ENCOUNTER — Ambulatory Visit (INDEPENDENT_AMBULATORY_CARE_PROVIDER_SITE_OTHER): Payer: BC Managed Care – PPO | Admitting: Psychiatry

## 2019-06-03 ENCOUNTER — Encounter: Payer: BC Managed Care – PPO | Admitting: Gastroenterology

## 2019-06-03 ENCOUNTER — Encounter (HOSPITAL_COMMUNITY): Payer: Self-pay | Admitting: Psychiatry

## 2019-06-03 ENCOUNTER — Other Ambulatory Visit: Payer: Self-pay

## 2019-06-03 DIAGNOSIS — F319 Bipolar disorder, unspecified: Secondary | ICD-10-CM

## 2019-06-03 DIAGNOSIS — F4310 Post-traumatic stress disorder, unspecified: Secondary | ICD-10-CM

## 2019-06-03 MED ORDER — LAMOTRIGINE 25 MG PO TABS: ORAL_TABLET | ORAL | 1 refills | Status: DC

## 2019-06-03 MED ORDER — CITALOPRAM HYDROBROMIDE 40 MG PO TABS: 40.0000 mg | ORAL_TABLET | Freq: Every day | ORAL | 1 refills | Status: DC

## 2019-06-03 MED ORDER — HYDROXYZINE PAMOATE 25 MG PO CAPS: 25.0000 mg | ORAL_CAPSULE | Freq: Every evening | ORAL | 1 refills | Status: DC | PRN
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
## 2019-06-04 ENCOUNTER — Encounter (HOSPITAL_COMMUNITY): Payer: Self-pay | Admitting: Psychology

## 2019-06-04 ENCOUNTER — Ambulatory Visit (INDEPENDENT_AMBULATORY_CARE_PROVIDER_SITE_OTHER): Payer: BC Managed Care – PPO | Admitting: Psychology

## 2019-06-04 ENCOUNTER — Other Ambulatory Visit: Payer: Self-pay

## 2019-06-04 DIAGNOSIS — F4310 Post-traumatic stress disorder, unspecified: Secondary | ICD-10-CM | POA: Diagnosis not present

## 2019-06-04 DIAGNOSIS — F319 Bipolar disorder, unspecified: Secondary | ICD-10-CM | POA: Diagnosis not present
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
## 2019-06-05 ENCOUNTER — Other Ambulatory Visit: Payer: Self-pay | Admitting: Family Medicine

## 2019-06-05 ENCOUNTER — Encounter: Payer: Self-pay | Admitting: Gastroenterology

## 2019-06-06 ENCOUNTER — Other Ambulatory Visit: Payer: Self-pay | Admitting: Family Medicine

## 2019-06-06 MED ORDER — ZIPRASIDONE HCL 20 MG PO CAPS: 20.0000 mg | ORAL_CAPSULE | Freq: Two times a day (BID) | ORAL | 0 refills | Status: DC

## 2019-06-10 ENCOUNTER — Telehealth: Payer: Self-pay

## 2019-06-10 NOTE — Telephone Encounter (Signed)
Dr. Fuller Plan this patient cancelled her procedure for tomorrow with you. She states that her mother in law passed away this morning.She will c/b to reschedule.

## 2019-06-11 ENCOUNTER — Encounter: Payer: BC Managed Care – PPO | Admitting: Gastroenterology

## 2019-06-13 DIAGNOSIS — M545 Low back pain: Secondary | ICD-10-CM | POA: Diagnosis not present

## 2019-06-17 ENCOUNTER — Telehealth: Payer: Self-pay | Admitting: Orthopaedic Surgery

## 2019-06-17 ENCOUNTER — Telehealth: Payer: Self-pay | Admitting: Family Medicine

## 2019-06-17 DIAGNOSIS — G894 Chronic pain syndrome: Secondary | ICD-10-CM | POA: Diagnosis not present

## 2019-06-17 DIAGNOSIS — M5136 Other intervertebral disc degeneration, lumbar region: Secondary | ICD-10-CM | POA: Diagnosis not present

## 2019-06-17 DIAGNOSIS — M79652 Pain in left thigh: Secondary | ICD-10-CM | POA: Diagnosis not present

## 2019-06-17 DIAGNOSIS — M47817 Spondylosis without myelopathy or radiculopathy, lumbosacral region: Secondary | ICD-10-CM | POA: Diagnosis not present

## 2019-06-17 NOTE — Telephone Encounter (Signed)
Received vm from [place] from Aimee [name] to check status of request for records. IC her back [phone], I lmam advised we have not receuved their request. I left both our fax lines for her to fax request.

## 2019-06-17 NOTE — Telephone Encounter (Signed)
Left message with [employer]

## 2019-06-18 ENCOUNTER — Other Ambulatory Visit: Payer: Self-pay

## 2019-06-18 ENCOUNTER — Ambulatory Visit (HOSPITAL_COMMUNITY): Payer: Self-pay | Admitting: Psychology

## 2019-06-18 ENCOUNTER — Encounter (HOSPITAL_COMMUNITY): Payer: Self-pay | Admitting: Psychology

## 2019-06-18 NOTE — Progress Notes (Signed)
Aimee [name] is a 48 y.o. female patient who scheduled for virtual therapy appointment today. I called pt and pt stated she forgot about the appointment today.  pt reported she had procedure yesterday placing temporary spine stimulator and wanted to reschedule for a day she felt more clear headed.  F/u scheduled for 07/04/19 at 12.30pm.        [name], [facility]

## 2019-06-26 ENCOUNTER — Telehealth: Payer: Self-pay | Admitting: Orthopaedic Surgery

## 2019-06-26 NOTE — Telephone Encounter (Signed)
Received a call from [facility] with [employer] law checking status of request for records. I call her back ,lmvm. Records being faxed.

## 2019-06-27 ENCOUNTER — Other Ambulatory Visit (HOSPITAL_COMMUNITY): Payer: Self-pay | Admitting: Psychiatry

## 2019-06-27 DIAGNOSIS — F319 Bipolar disorder, unspecified: Secondary | ICD-10-CM

## 2019-06-27 DIAGNOSIS — F4310 Post-traumatic stress disorder, unspecified: Secondary | ICD-10-CM

## 2019-07-01 ENCOUNTER — Other Ambulatory Visit: Payer: Self-pay

## 2019-07-01 ENCOUNTER — Ambulatory Visit (INDEPENDENT_AMBULATORY_CARE_PROVIDER_SITE_OTHER): Payer: BC Managed Care – PPO | Admitting: Psychiatry

## 2019-07-01 ENCOUNTER — Other Ambulatory Visit: Payer: Self-pay | Admitting: Family Medicine

## 2019-07-01 ENCOUNTER — Encounter (HOSPITAL_COMMUNITY): Payer: Self-pay | Admitting: Psychiatry

## 2019-07-01 DIAGNOSIS — F319 Bipolar disorder, unspecified: Secondary | ICD-10-CM | POA: Diagnosis not present

## 2019-07-01 DIAGNOSIS — F4310 Post-traumatic stress disorder, unspecified: Secondary | ICD-10-CM | POA: Diagnosis not present

## 2019-07-01 DIAGNOSIS — J189 Pneumonia, unspecified organism: Secondary | ICD-10-CM

## 2019-07-01 MED ORDER — LAMOTRIGINE 25 MG PO TABS: ORAL_TABLET | ORAL | 1 refills | Status: DC

## 2019-07-01 MED ORDER — CITALOPRAM HYDROBROMIDE 40 MG PO TABS: 40.0000 mg | ORAL_TABLET | Freq: Every day | ORAL | 1 refills | Status: DC

## 2019-07-01 MED ORDER — HYDROXYZINE PAMOATE 25 MG PO CAPS: 25.0000 mg | ORAL_CAPSULE | Freq: Every evening | ORAL | 1 refills | Status: DC | PRN
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
## 2019-07-03 ENCOUNTER — Ambulatory Visit (INDEPENDENT_AMBULATORY_CARE_PROVIDER_SITE_OTHER): Payer: BC Managed Care – PPO | Admitting: Family Medicine

## 2019-07-03 ENCOUNTER — Encounter: Payer: Self-pay | Admitting: Family Medicine

## 2019-07-03 ENCOUNTER — Other Ambulatory Visit: Payer: Self-pay

## 2019-07-03 DIAGNOSIS — J209 Acute bronchitis, unspecified: Secondary | ICD-10-CM | POA: Diagnosis not present

## 2019-07-03 MED ORDER — HYDROCODONE-HOMATROPINE 5-1.5 MG/5ML PO SYRP: 5.0000 mL | ORAL_SOLUTION | Freq: Three times a day (TID) | ORAL | 0 refills | Status: DC | PRN

## 2019-07-03 MED ORDER — FLUCONAZOLE 150 MG PO TABS: 150.0000 mg | ORAL_TABLET | Freq: Once | ORAL | 0 refills | Status: AC

## 2019-07-03 MED ORDER — LEVOFLOXACIN 500 MG PO TABS: 500.0000 mg | ORAL_TABLET | Freq: Every day | ORAL | 0 refills | Status: DC

## 2019-07-03 NOTE — Progress Notes (Signed)
Subjective:    Patient ID: Aimee [name], female    DOB: [birth year], 48 y.o.   MRN: [id number]   HPI: Aimee [name] is a 48 y.o. female presenting for dry cough. Tight at first. Now improved. No fever, ST, or dyspnea. Real tired. Onset 5 days ago.   Depression screen [facility] 2/9 06/20/2018 06/08/2018 06/04/2018 05/21/2018 02/21/2018  Decreased Interest 0 0 0 0 3  Down, Depressed, Hopeless 0 0 0 0 2  PHQ - 2 Score 0 0 0 0 5  Altered sleeping - - - - 3  Tired, decreased energy - - - - 3  Change in appetite - - - - 3  Feeling bad or failure about yourself  - - - - 2  Trouble concentrating - - - - 2  Moving slowly or fidgety/restless - - - - 3  Suicidal thoughts - - - - 1  PHQ-9 Score - - - - 22  Difficult doing work/chores - - - - -  Some recent data might be hidden     Relevant past medical, surgical, family and social history reviewed and updated as indicated.  Interim medical history since our last visit reviewed. Allergies and medications reviewed and updated.  ROS:  Review of Systems  Constitutional: Negative for activity change, appetite change, chills and fever.  HENT: Positive for congestion, postnasal drip, rhinorrhea and sinus pressure. Negative for ear discharge, ear pain, hearing loss, nosebleeds, sneezing and trouble swallowing.   Respiratory: Negative for chest tightness and shortness of breath.   Cardiovascular: Negative for chest pain and palpitations.  Skin: Negative for rash.     Social History   Tobacco Use  Smoking Status Current Every Day Smoker  . Packs/day: 0.25  . Years: 23.00  . Pack years: 5.75  . Types: Cigarettes  Smokeless Tobacco Never Used       Objective:     Wt Readings from Last 3 Encounters:  05/30/19 202 lb 2 oz (91.7 kg)  02/27/19 210 lb (95.3 kg)  01/30/19 210 lb (95.3 kg)     Exam deferred. Pt. Harboring due to COVID 19. Phone visit performed.   Assessment & Plan:   1. Acute bronchitis, unspecified organism     Meds  ordered this encounter  Medications  . DISCONTD: HYDROcodone-homatropine (HYCODAN) 5-1.5 MG/5ML syrup    Sig: Take 5 mLs by mouth every 8 (eight) hours as needed for cough.    Dispense:  120 mL    Refill:  0  . levofloxacin (LEVAQUIN) 500 MG tablet    Sig: Take 1 tablet (500 mg total) by mouth daily.    Dispense:  7 tablet    Refill:  0  . HYDROcodone-homatropine (HYCODAN) 5-1.5 MG/5ML syrup    Sig: Take 5 mLs by mouth every 8 (eight) hours as needed for cough.    Dispense:  120 mL    Refill:  0  . fluconazole (DIFLUCAN) 150 MG tablet    Sig: Take 1 tablet (150 mg total) by mouth once for 1 dose. At onset of symptoms. Repeat at end of treatment    Dispense:  2 tablet    Refill:  0    No orders of the defined types were placed in this encounter.     Diagnoses and all orders for this visit:  Acute bronchitis, unspecified organism -     Discontinue: HYDROcodone-homatropine (HYCODAN) 5-1.5 MG/5ML syrup; Take 5 mLs by mouth every 8 (eight) hours as needed for cough. -  HYDROcodone-homatropine (HYCODAN) 5-1.5 MG/5ML syrup; Take 5 mLs by mouth every 8 (eight) hours as needed for cough.  Other orders -     levofloxacin (LEVAQUIN) 500 MG tablet; Take 1 tablet (500 mg total) by mouth daily. -     fluconazole (DIFLUCAN) 150 MG tablet; Take 1 tablet (150 mg total) by mouth once for 1 dose. At onset of symptoms. Repeat at end of treatment    Virtual Visit via telephone Note  I discussed the limitations, risks, security and privacy concerns of performing an evaluation and management service by telephone and the availability of in person appointments. The patient was identified with two identifiers. Pt.expressed understanding and agreed to proceed. Pt. Is at home. Dr. [name] is in his office.  Follow Up Instructions:   I discussed the assessment and treatment plan with the patient. The patient was provided an opportunity to ask questions and all were answered. The patient agreed with  the plan and demonstrated an understanding of the instructions.   The patient was advised to call back or seek an in-person evaluation if the symptoms worsen or if the condition fails to improve as anticipated.   Total minutes including chart review and phone contact time: 12   Follow up plan: No follow-ups on file.  Aimee Fraise, MD [place]

## 2019-07-04 ENCOUNTER — Other Ambulatory Visit: Payer: Self-pay | Admitting: Family Medicine

## 2019-07-04 ENCOUNTER — Ambulatory Visit (HOSPITAL_COMMUNITY): Payer: BC Managed Care – PPO | Admitting: Psychology

## 2019-07-04 ENCOUNTER — Encounter: Payer: Self-pay | Admitting: Family Medicine

## 2019-07-04 ENCOUNTER — Other Ambulatory Visit: Payer: Self-pay

## 2019-07-04 DIAGNOSIS — F319 Bipolar disorder, unspecified: Secondary | ICD-10-CM

## 2019-07-04 NOTE — Progress Notes (Signed)
Virtual Visit via Telephone Note  I connected with Aimee [name] on 07/04/19 at 12:30 PM EDT by telephone and verified that I am speaking with the correct person using two identifiers.   I discussed the limitations, risks, security and privacy concerns of performing an evaluation and management service by telephone and the availability of in person appointments. I also discussed with the patient that there may be a patient responsible charge related to this service. The patient expressed understanding and agreed to proceed.   I discussed the assessment and treatment plan with the patient. The patient was provided an opportunity to ask questions and all were answered. The patient agreed with the plan and demonstrated an understanding of the instructions.   The patient was advised to call back or seek an in-person evaluation if the symptoms worsen or if the condition fails to improve as anticipated.  I provided 45 minutes of non-face-to-face time during this encounter.   [name] [facility]    THERAPIST PROGRESS NOTE  Session Time: 12.30pm-1.15pm  Participation Level: Active  Behavioral Response: Well GroomedAlertaffect wnl  Type of Therapy: Individual Therapy  Treatment Goals addressed: Diagnosis: Bipolar 1 d/o and goal 1.  Interventions: CBT and Strength-based  Summary: Aimee [name] is a 48 y.o. female who presents with affect wnl.  pt reported that her mood has generally been good recently.  Pt reported she is still struggling w/ initiating sleep, but irritability has improved.  Pt reported that she has been focused on her husband and children w/ her mother in laws death and some stressors that children are facing.  Pt reports that she is scheduled for surgery to have permanent spine stimulator placed.  Pt reports that she will have a 6-8 week recovery and after her first week her husband will be returning to work and she is aware that this will be difficulty.  Pt has enjoyed time  w her husband at home since his work shut down in March due to pandemic.  Pt is aware that increased quiet and being to herself is hard.  Pt reported that positive that her daughter planning to assist during day w/ grandkids present- pt thankful for company.  Pt discussed how she is struggling to come to acceptance that she can't do things that used to and won't be able to.  "I'm used to being super woman and don't want to admit defeat". Pt increased awareness of reframing to see not as defeat but shift and how she can do things (contribute and support) but in a different way.  Suicidal/Homicidal: Nowithout intent/plan  Therapist Response: Assessed pt current functioning per pt report.  Processed w/pt coping w/ stressors recent and upcoming.  Explored transition w/ surgery and husband return to work.  Discussed w/pt how to reframe distortion of defeat.  Plan: Return again in 3 weeks, via phone call.  F/u as scheduled w/ Dr. Adele Schilder.  Diagnosis: Bipolar 1 d/o   [name] [facility] 07/04/2019

## 2019-07-05 ENCOUNTER — Other Ambulatory Visit: Payer: Self-pay | Admitting: *Deleted

## 2019-07-05 MED ORDER — METFORMIN HCL 500 MG PO TABS: 500.0000 mg | ORAL_TABLET | Freq: Every day | ORAL | 1 refills | Status: DC

## 2019-07-05 MED ORDER — ATORVASTATIN CALCIUM 40 MG PO TABS: 40.0000 mg | ORAL_TABLET | Freq: Every day | ORAL | 1 refills | Status: DC

## 2019-07-05 MED ORDER — FENOFIBRATE 160 MG PO TABS: 160.0000 mg | ORAL_TABLET | Freq: Every day | ORAL | 1 refills | Status: DC

## 2019-07-05 MED ORDER — EZETIMIBE 10 MG PO TABS: 10.0000 mg | ORAL_TABLET | Freq: Every day | ORAL | 1 refills | Status: DC

## 2019-07-06 DIAGNOSIS — Z01818 Encounter for other preprocedural examination: Secondary | ICD-10-CM | POA: Diagnosis not present

## 2019-07-07 ENCOUNTER — Encounter: Payer: Self-pay | Admitting: Family Medicine

## 2019-07-09 DIAGNOSIS — M79652 Pain in left thigh: Secondary | ICD-10-CM | POA: Diagnosis not present

## 2019-07-09 DIAGNOSIS — Z87891 Personal history of nicotine dependence: Secondary | ICD-10-CM | POA: Diagnosis not present

## 2019-07-09 DIAGNOSIS — Z885 Allergy status to narcotic agent status: Secondary | ICD-10-CM | POA: Diagnosis not present

## 2019-07-09 DIAGNOSIS — G894 Chronic pain syndrome: Secondary | ICD-10-CM | POA: Diagnosis not present

## 2019-07-09 DIAGNOSIS — E785 Hyperlipidemia, unspecified: Secondary | ICD-10-CM | POA: Diagnosis not present

## 2019-07-09 DIAGNOSIS — Z888 Allergy status to other drugs, medicaments and biological substances status: Secondary | ICD-10-CM | POA: Diagnosis not present

## 2019-07-09 DIAGNOSIS — M1990 Unspecified osteoarthritis, unspecified site: Secondary | ICD-10-CM | POA: Diagnosis not present

## 2019-07-09 DIAGNOSIS — H9193 Unspecified hearing loss, bilateral: Secondary | ICD-10-CM | POA: Diagnosis not present

## 2019-07-09 DIAGNOSIS — G8929 Other chronic pain: Secondary | ICD-10-CM | POA: Diagnosis not present

## 2019-07-09 DIAGNOSIS — M47817 Spondylosis without myelopathy or radiculopathy, lumbosacral region: Secondary | ICD-10-CM | POA: Diagnosis not present

## 2019-07-09 DIAGNOSIS — M5136 Other intervertebral disc degeneration, lumbar region: Secondary | ICD-10-CM | POA: Diagnosis not present

## 2019-07-09 DIAGNOSIS — Z79899 Other long term (current) drug therapy: Secondary | ICD-10-CM | POA: Diagnosis not present

## 2019-07-09 DIAGNOSIS — F329 Major depressive disorder, single episode, unspecified: Secondary | ICD-10-CM | POA: Diagnosis not present

## 2019-07-09 DIAGNOSIS — E119 Type 2 diabetes mellitus without complications: Secondary | ICD-10-CM | POA: Diagnosis not present

## 2019-07-09 DIAGNOSIS — Z981 Arthrodesis status: Secondary | ICD-10-CM | POA: Diagnosis not present

## 2019-07-14 DIAGNOSIS — M545 Low back pain: Secondary | ICD-10-CM | POA: Diagnosis not present

## 2019-07-23 ENCOUNTER — Other Ambulatory Visit: Payer: Self-pay | Admitting: Family Medicine

## 2019-07-23 DIAGNOSIS — I700 Atherosclerosis of aorta: Secondary | ICD-10-CM

## 2019-07-25 ENCOUNTER — Other Ambulatory Visit (HOSPITAL_COMMUNITY): Payer: Self-pay | Admitting: Psychiatry

## 2019-07-25 ENCOUNTER — Other Ambulatory Visit: Payer: Self-pay

## 2019-07-25 ENCOUNTER — Ambulatory Visit (INDEPENDENT_AMBULATORY_CARE_PROVIDER_SITE_OTHER): Payer: BC Managed Care – PPO | Admitting: Psychology

## 2019-07-25 DIAGNOSIS — F319 Bipolar disorder, unspecified: Secondary | ICD-10-CM

## 2019-07-25 NOTE — Progress Notes (Signed)
Virtual Visit via Telephone Note  I connected with Aimee [name] on 07/25/19 at  1:30 PM EST by telephone and verified that I am speaking with the correct person using two identifiers.   I discussed the limitations, risks, security and privacy concerns of performing an evaluation and management service by telephone and the availability of in person appointments. I also discussed with the patient that there may be a patient responsible charge related to this service. The patient expressed understanding and agreed to proceed.    I discussed the assessment and treatment plan with the patient. The patient was provided an opportunity to ask questions and all were answered. The patient agreed with the plan and demonstrated an understanding of the instructions.   The patient was advised to call back or seek an in-person evaluation if the symptoms worsen or if the condition fails to improve as anticipated.  I provided 48 minutes of non-face-to-face time during this encounter.   [name] [facility]    THERAPIST PROGRESS NOTE  Session Time: 1.32pm-2.20pm  Participation Level: Active  Behavioral Response: n/a on the phoneAlertaffect wnl  Type of Therapy: Individual Therapy  Treatment Goals addressed: Diagnosis: Bipolar 1 d/o and goal 1.  Interventions: CBT, Strength-based and Supportive  Summary: Aimee [name] is a 48 y.o. female who presents with affect wnl.  pt reported she is recovering from her surgery, had bandages removed early this week, but still restrictions over the next 6 weeks.  Pt reported that she has been very tired recently and napping a lot which is unusually for her.  Pt reports that no med changes and mood has been ok.  Pt reported that her husband returns to work next week and have worry about exposure to Covid and too quiet around house.  Pt discussed holidays and how will look different w/ pandemic and her concern w/ others being too comfortable and not taking  precautions.  Pt discussed how will be difficult changing tradition.  Suicidal/Homicidal: Nowithout intent/plan  Therapist Response: Assessed pt current functioning per pt report. Processed w/pt recovery from her surgery and transition to husband return to work.  Explored w/pt ways of keeping engaged through the transition and self care to heal.  Discussed upcoming holidays and her concerns w/ pandemic/precautions and plans for continuing to engage and celebrate.  Plan: Return again in 3 weeks, via telephone.  Diagnosis: Bipolar 1 d/o.  [name] [facility] 07/25/2019

## 2019-08-06 ENCOUNTER — Other Ambulatory Visit: Payer: Self-pay

## 2019-08-06 ENCOUNTER — Ambulatory Visit (INDEPENDENT_AMBULATORY_CARE_PROVIDER_SITE_OTHER): Payer: BC Managed Care – PPO | Admitting: Psychiatry

## 2019-08-06 DIAGNOSIS — F4310 Post-traumatic stress disorder, unspecified: Secondary | ICD-10-CM

## 2019-08-06 DIAGNOSIS — F319 Bipolar disorder, unspecified: Secondary | ICD-10-CM | POA: Diagnosis not present

## 2019-08-06 MED ORDER — LAMOTRIGINE 25 MG PO TABS: ORAL_TABLET | ORAL | 1 refills | Status: DC

## 2019-08-06 MED ORDER — CITALOPRAM HYDROBROMIDE 40 MG PO TABS: 40.0000 mg | ORAL_TABLET | Freq: Every day | ORAL | 1 refills | Status: DC

## 2019-08-06 MED ORDER — HYDROXYZINE PAMOATE 25 MG PO CAPS: ORAL_CAPSULE | ORAL | 1 refills | Status: DC
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
## 2019-08-12 ENCOUNTER — Telehealth (HOSPITAL_COMMUNITY): Payer: Self-pay

## 2019-08-12 DIAGNOSIS — F4310 Post-traumatic stress disorder, unspecified: Secondary | ICD-10-CM

## 2019-08-12 NOTE — Telephone Encounter (Signed)
Medication refill - Patient's [facility] pharmacy is requesting a 90 day order for her Citalopram.  Pt. returns next on 10/02/19.

## 2019-08-13 DIAGNOSIS — M545 Low back pain: Secondary | ICD-10-CM | POA: Diagnosis not present

## 2019-08-13 MED ORDER — CITALOPRAM HYDROBROMIDE 40 MG PO TABS: 40.0000 mg | ORAL_TABLET | Freq: Every day | ORAL | 0 refills | Status: DC

## 2019-08-13 NOTE — Telephone Encounter (Signed)
Yes. 90 days is ok.

## 2019-08-13 NOTE — Telephone Encounter (Signed)
A new 90 day order for patient's prescribed Citalopram sent to patient's [facility] pharmacy in [place] per verbal order by Dr. Adele Schilder this date.

## 2019-08-19 DIAGNOSIS — M47817 Spondylosis without myelopathy or radiculopathy, lumbosacral region: Secondary | ICD-10-CM | POA: Diagnosis not present

## 2019-08-19 DIAGNOSIS — Z9689 Presence of other specified functional implants: Secondary | ICD-10-CM | POA: Diagnosis not present

## 2019-08-19 DIAGNOSIS — M5136 Other intervertebral disc degeneration, lumbar region: Secondary | ICD-10-CM | POA: Diagnosis not present

## 2019-08-19 DIAGNOSIS — G894 Chronic pain syndrome: Secondary | ICD-10-CM | POA: Diagnosis not present

## 2019-08-20 ENCOUNTER — Other Ambulatory Visit: Payer: Self-pay

## 2019-08-20 ENCOUNTER — Ambulatory Visit (INDEPENDENT_AMBULATORY_CARE_PROVIDER_SITE_OTHER): Payer: BC Managed Care – PPO | Admitting: Psychology

## 2019-08-20 DIAGNOSIS — F4310 Post-traumatic stress disorder, unspecified: Secondary | ICD-10-CM

## 2019-08-20 DIAGNOSIS — F319 Bipolar disorder, unspecified: Secondary | ICD-10-CM | POA: Diagnosis not present
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
## 2019-08-21 DIAGNOSIS — H90A31 Mixed conductive and sensorineural hearing loss, unilateral, right ear with restricted hearing on the contralateral side: Secondary | ICD-10-CM | POA: Diagnosis not present

## 2019-08-21 DIAGNOSIS — H7011 Chronic mastoiditis, right ear: Secondary | ICD-10-CM | POA: Diagnosis not present

## 2019-08-21 DIAGNOSIS — H90A22 Sensorineural hearing loss, unilateral, left ear, with restricted hearing on the contralateral side: Secondary | ICD-10-CM | POA: Diagnosis not present

## 2019-08-27 ENCOUNTER — Other Ambulatory Visit: Payer: Self-pay | Admitting: Physician Assistant

## 2019-08-27 DIAGNOSIS — Z1159 Encounter for screening for other viral diseases: Secondary | ICD-10-CM | POA: Diagnosis not present

## 2019-09-13 DIAGNOSIS — M545 Low back pain: Secondary | ICD-10-CM | POA: Diagnosis not present

## 2019-09-19 ENCOUNTER — Other Ambulatory Visit: Payer: Self-pay

## 2019-09-19 ENCOUNTER — Ambulatory Visit (INDEPENDENT_AMBULATORY_CARE_PROVIDER_SITE_OTHER): Payer: BC Managed Care – PPO | Admitting: Psychology

## 2019-09-19 DIAGNOSIS — F319 Bipolar disorder, unspecified: Secondary | ICD-10-CM

## 2019-09-19 NOTE — Progress Notes (Signed)
Virtual Visit via Telephone Note  I connected with Aimee [name] on 09/19/19 at  1:30 PM EST by telephone and verified that I am speaking with the correct person using two identifiers.   I discussed the limitations, risks, security and privacy concerns of performing an evaluation and management service by telephone and the availability of in person appointments. I also discussed with the patient that there may be a patient responsible charge related to this service. The patient expressed understanding and agreed to proceed.    I discussed the assessment and treatment plan with the patient. The patient was provided an opportunity to ask questions and all were answered. The patient agreed with the plan and demonstrated an understanding of the instructions.   The patient was advised to call back or seek an in-person evaluation if the symptoms worsen or if the condition fails to improve as anticipated.  I provided 57 minutes of non-face-to-face time during this encounter.   [name] [facility]    THERAPIST PROGRESS NOTE  Session Time: 1.30pm-2.27pm  Participation Level: Active  Behavioral Response: n/a on the phoneAlertaffect stressed  Type of Therapy: Individual Therapy  Treatment Goals addressed: Diagnosis: bipolar 1 d/o and goal 1.  Interventions: CBT and Strength-based  Summary: Aimee [name] is a 49 y.o. female who presents with report of not good sleep past several days.  Pt reported increased ruminating worries and thoughts of hearing for disability that had yesterday. Pt reported that w/ not being able to sleep and increased energy has been cleaning and decluttering.  Pt is aware that can be heading towards mania and focus on easing system.  Pt reported that she has other stressors as well her concern for her youngest daughter and recent break up, worry for oldest daughter and her parenting stress, etc.  Pt acknowledges that she is mother bear and takes on all her family's  stress. Pt awareness of need to have boundaries and not overdo.    Suicidal/Homicidal: Nowithout intent/plan  Therapist Response: Assessed pt current functioning per pt report.  Processed w/pt her worries and recent stressors.  Discussed seeking ways of soothing through anxiety and stress.  Reflected positive coping skills and ways that setting some boundaries for self to not take on too much.  Plan: Return again in 4 weeks, via telephone. F/u as scheduled w/ Dr. Adele Schilder.   Diagnosis: Bipolar 1 d/o    [name] [facility] 09/19/2019

## 2019-09-26 DIAGNOSIS — Z20822 Contact with and (suspected) exposure to covid-19: Secondary | ICD-10-CM | POA: Diagnosis not present

## 2019-09-30 ENCOUNTER — Encounter: Payer: Self-pay | Admitting: Family Medicine

## 2019-10-01 ENCOUNTER — Other Ambulatory Visit: Payer: Self-pay | Admitting: Family Medicine

## 2019-10-01 MED ORDER — AZITHROMYCIN 250 MG PO TABS: ORAL_TABLET | ORAL | 0 refills | Status: DC

## 2019-10-02 ENCOUNTER — Encounter (HOSPITAL_COMMUNITY): Payer: Self-pay | Admitting: Psychiatry

## 2019-10-02 ENCOUNTER — Other Ambulatory Visit: Payer: Self-pay

## 2019-10-02 ENCOUNTER — Ambulatory Visit (INDEPENDENT_AMBULATORY_CARE_PROVIDER_SITE_OTHER): Payer: BC Managed Care – PPO | Admitting: Psychiatry

## 2019-10-02 ENCOUNTER — Other Ambulatory Visit: Payer: Self-pay | Admitting: *Deleted

## 2019-10-02 DIAGNOSIS — F4310 Post-traumatic stress disorder, unspecified: Secondary | ICD-10-CM | POA: Diagnosis not present

## 2019-10-02 DIAGNOSIS — F319 Bipolar disorder, unspecified: Secondary | ICD-10-CM

## 2019-10-02 MED ORDER — LAMOTRIGINE 100 MG PO TABS: 100.0000 mg | ORAL_TABLET | Freq: Every day | ORAL | 0 refills | Status: DC

## 2019-10-02 MED ORDER — CITALOPRAM HYDROBROMIDE 40 MG PO TABS: 40.0000 mg | ORAL_TABLET | Freq: Every day | ORAL | 0 refills | Status: DC

## 2019-10-02 MED ORDER — HYDROXYZINE PAMOATE 50 MG PO CAPS: ORAL_CAPSULE | ORAL | 0 refills | Status: DC
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
## 2019-10-04 ENCOUNTER — Other Ambulatory Visit: Payer: Self-pay | Admitting: Family Medicine

## 2019-10-04 ENCOUNTER — Other Ambulatory Visit: Payer: Self-pay | Admitting: *Deleted

## 2019-10-04 DIAGNOSIS — F319 Bipolar disorder, unspecified: Secondary | ICD-10-CM

## 2019-10-04 MED ORDER — BACLOFEN 10 MG PO TABS: 10.0000 mg | ORAL_TABLET | Freq: Three times a day (TID) | ORAL | 11 refills | Status: DC

## 2019-10-05 ENCOUNTER — Encounter: Payer: Self-pay | Admitting: Family Medicine

## 2019-10-05 ENCOUNTER — Other Ambulatory Visit (HOSPITAL_COMMUNITY): Payer: Self-pay | Admitting: Psychiatry

## 2019-10-05 DIAGNOSIS — F4310 Post-traumatic stress disorder, unspecified: Secondary | ICD-10-CM

## 2019-10-08 NOTE — Telephone Encounter (Signed)
Patient scheduled a televisit with Dr [name] on 10/14/2019 for med change. Patient wants to know if in the meantime, can she up her metformin Rx?

## 2019-10-09 IMAGING — US US BIOPSY CORE LIVER
1 series · 13 of 23 positions shown · non-contrast
Comparison: none

INDICATION: 47-year-old female with elevated LFTs and possible fatty liver
disease and/or autoimmune hepatitis. She presents for random liver
biopsy.

[Series 1: us biopsy core liver · 13 of 23 slices shown]
[im 1/23]
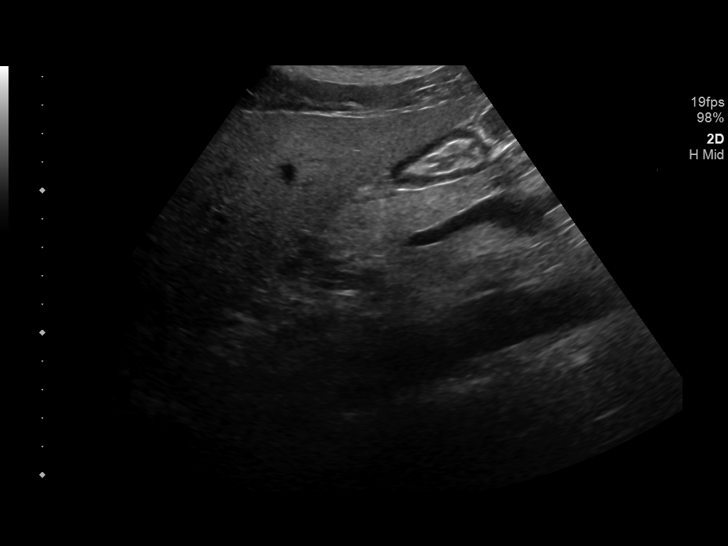
[im 3/23]
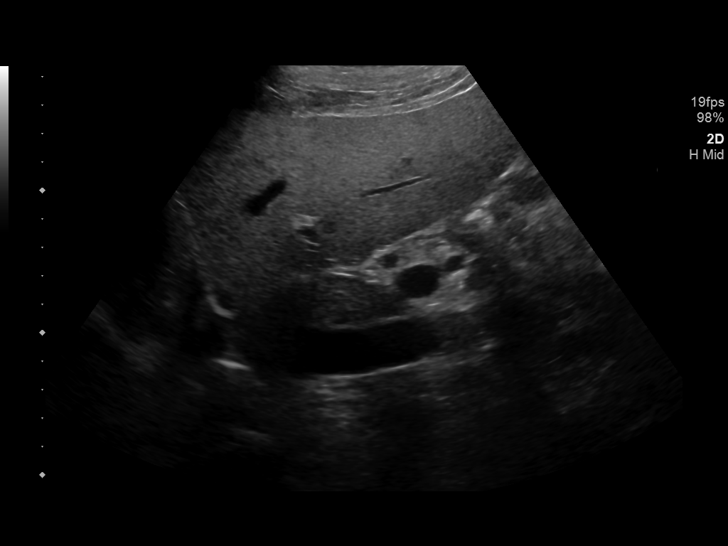
[im 5/23]
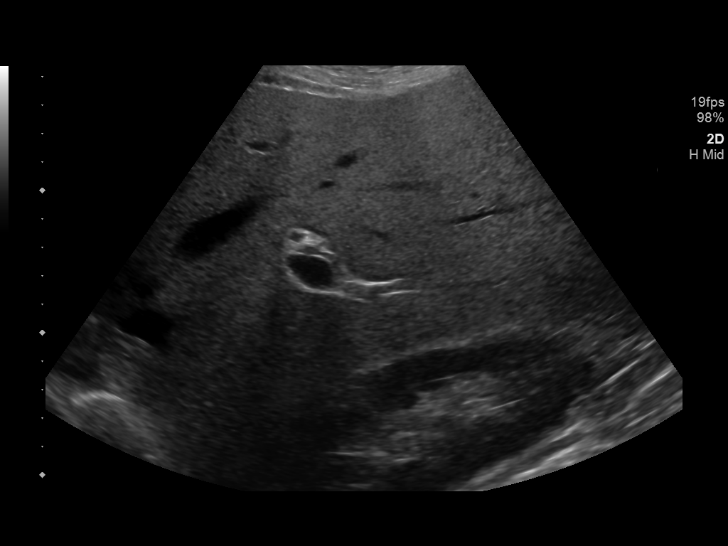
[im 7/23]
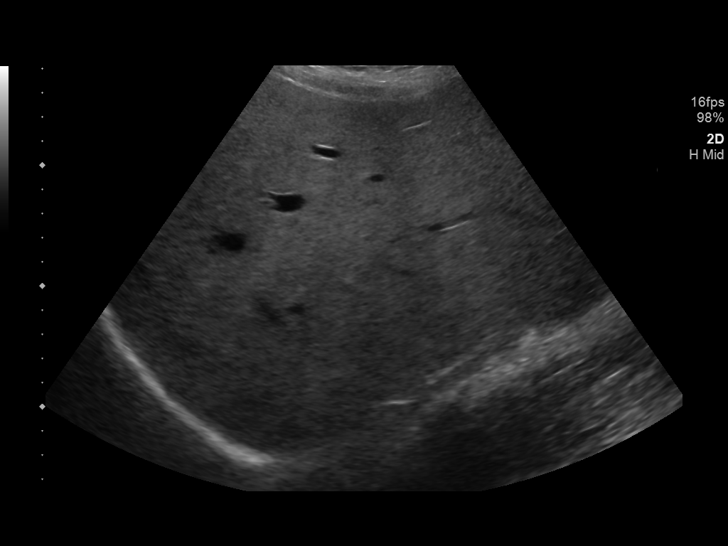
[im 8/23]
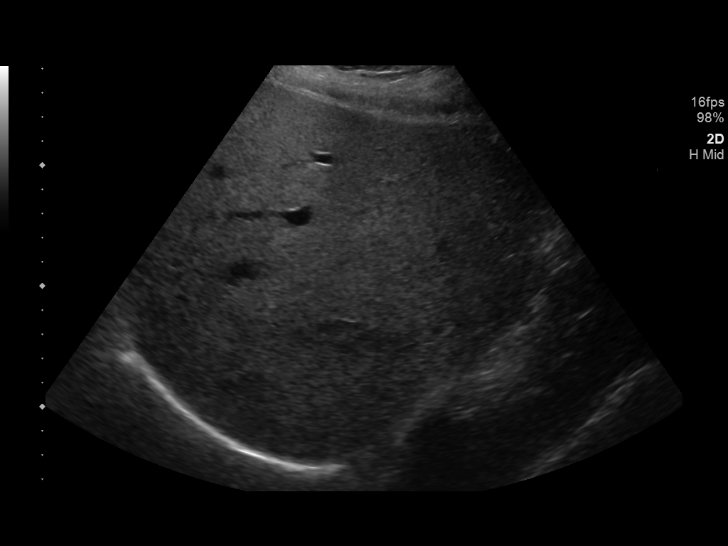
[im 10/23]
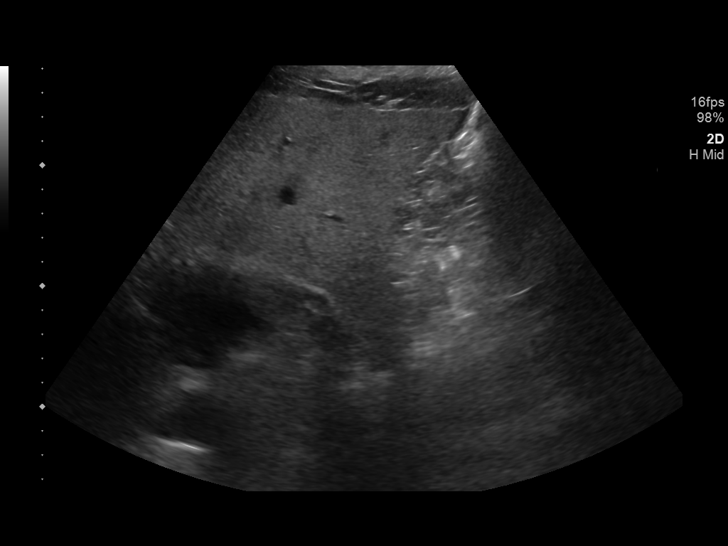
[im 12/23]
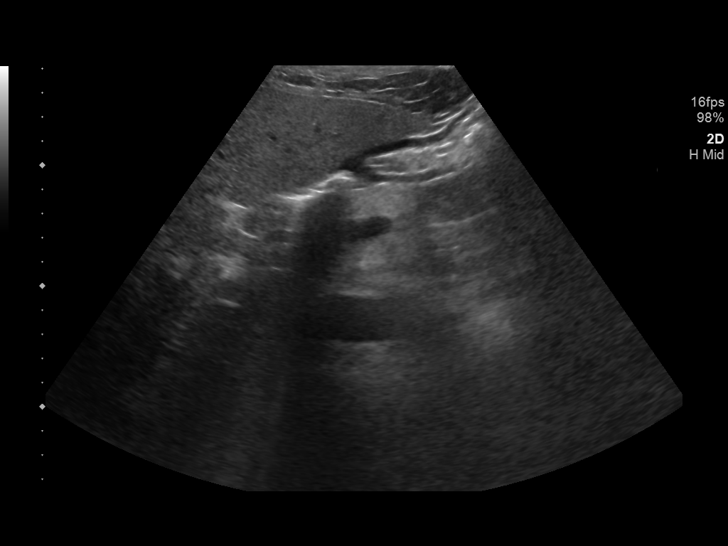
[im 14/23]
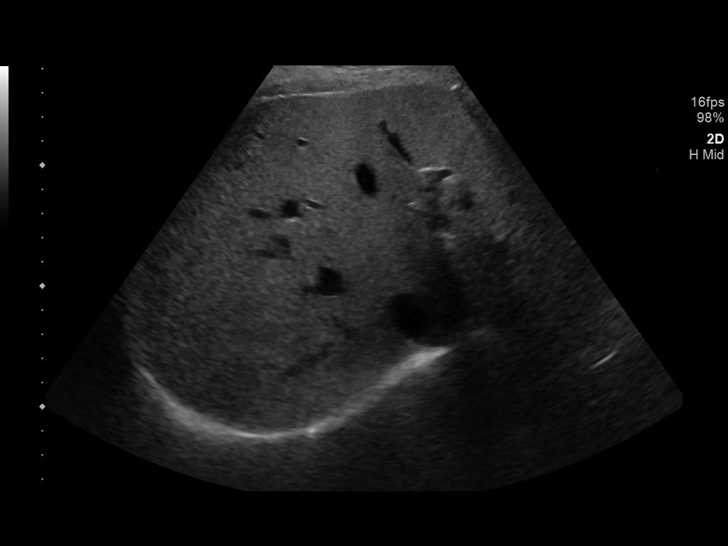
[im 16/23]
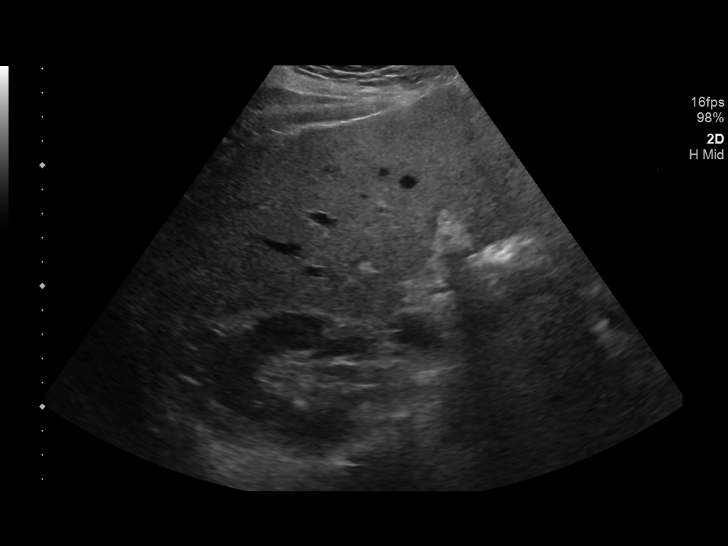
[im 17/23]
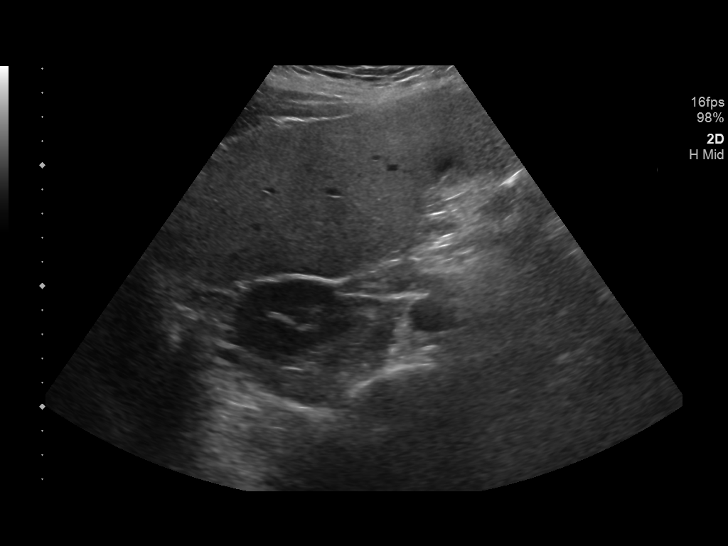
[im 19/23]
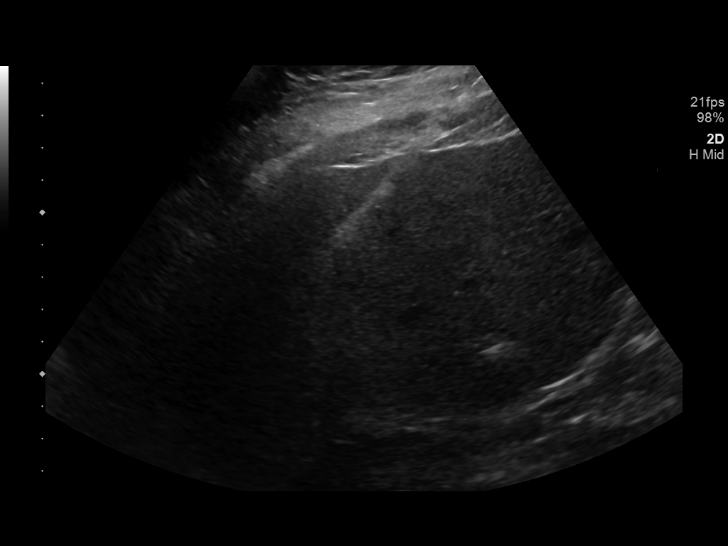
[im 21/23]
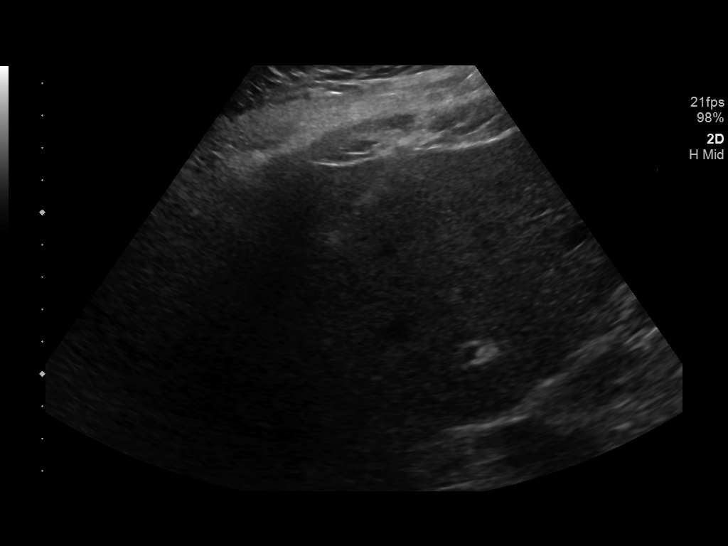
[im 23/23]
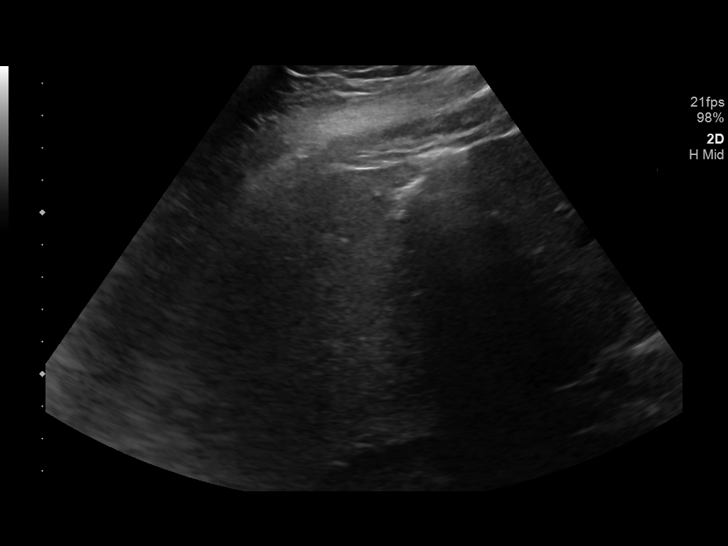

[13 of 23 positions shown; findings below may reference images not displayed]

EXAM:
Ultrasound-guided core biopsy of the liver

MEDICATIONS:
None.

ANESTHESIA/SEDATION:
Fentanyl 150 mcg IV; Versed 3 mg IV

Moderate Sedation Time:  14 minutes

The patient was continuously monitored during the procedure by the
interventional radiology nurse under my direct supervision.

FLUOROSCOPY TIME:  Fluoroscopy Time: 0 minutes 0 seconds (0 mGy).

COMPLICATIONS:
None immediate.

PROCEDURE:
Informed consent was obtained from the patient following explanation
of the procedure, risks, benefits and alternatives. The patient
understands, agrees and consents for the procedure. All questions
were addressed. A time out was performed.

The right upper quadrant was interrogated with ultrasound. A
relatively avascular plane of the liver was identified. A suitable
skin entry site was selected and marked. The region was then
sterilely prepped and draped in standard fashion using chlorhexidine
skin prep. Local anesthesia was attained by infiltration with 1%
lidocaine. A small dermatotomy was made.

Under real-time sonographic guidance, a 17 gauge trocar needle was
advanced into the liver. Multiple 18 gauge core biopsies were then
coaxially obtained. Needle placement was confirmed on all biopsy
passes with real-time sonography. Biopsy specimens were placed in
formalin and delivered to pathology for further analysis.

As the introducer needle was removed, the biopsy tract was embolized
with a Gel-Foam slurry. Post biopsy ultrasound imaging demonstrates
no active bleeding or perihepatic hematoma. The patient tolerated
the procedure well.
IMPRESSION: Technically successful ultrasound-guided random core biopsy of the
liver.

## 2019-10-14 ENCOUNTER — Ambulatory Visit (INDEPENDENT_AMBULATORY_CARE_PROVIDER_SITE_OTHER): Payer: BC Managed Care – PPO | Admitting: Family Medicine

## 2019-10-14 ENCOUNTER — Encounter: Payer: Self-pay | Admitting: Family Medicine

## 2019-10-14 DIAGNOSIS — E1165 Type 2 diabetes mellitus with hyperglycemia: Secondary | ICD-10-CM

## 2019-10-14 DIAGNOSIS — E1142 Type 2 diabetes mellitus with diabetic polyneuropathy: Secondary | ICD-10-CM | POA: Diagnosis not present

## 2019-10-14 DIAGNOSIS — M545 Low back pain: Secondary | ICD-10-CM | POA: Diagnosis not present

## 2019-10-14 MED ORDER — JARDIANCE 10 MG PO TABS: 10.0000 mg | ORAL_TABLET | Freq: Every day | ORAL | 2 refills | Status: DC

## 2019-10-14 NOTE — Progress Notes (Signed)
Subjective:    Patient ID: Aimee [name], female    DOB: 09/26/1970, 49 y.o.   MRN: [id number]   HPI: Aimee [name] is a 49 y.o. female presenting for presents forFollow-up of diabetes. Patient checks blood sugar at home. For a long time it was  80s-90 fasting and upper 90s postprandial. For three to six months it has started going higher, recently as high as 300.  Patient denies symptoms such as polyuria, polydipsia, excessive hunger, nausea No significant hypoglycemic spells noted. Medications reviewed. Pt reports taking them regularly Concerned that metformin can cause cancer. Wants to try Jardiance.  Depression screen [facility] 2/9 06/20/2018 06/08/2018 06/04/2018 05/21/2018 02/21/2018  Decreased Interest 0 0 0 0 3  Down, Depressed, Hopeless 0 0 0 0 2  PHQ - 2 Score 0 0 0 0 5  Altered sleeping - - - - 3  Tired, decreased energy - - - - 3  Change in appetite - - - - 3  Feeling bad or failure about yourself  - - - - 2  Trouble concentrating - - - - 2  Moving slowly or fidgety/restless - - - - 3  Suicidal thoughts - - - - 1  PHQ-9 Score - - - - 22  Difficult doing work/chores - - - - -  Some recent data might be hidden     Relevant past medical, surgical, family and social history reviewed and updated as indicated.  Interim medical history since our last visit reviewed. Allergies and medications reviewed and updated.  ROS:  Review of Systems  Constitutional: Positive for diaphoresis (cold sweats). Negative for fatigue.  HENT: Negative.   Eyes: Negative for visual disturbance.  Respiratory: Negative for shortness of breath.   Cardiovascular: Negative for chest pain.  Gastrointestinal: Negative for abdominal pain.  Musculoskeletal: Positive for arthralgias (hips, legs and neck) and back pain.     Social History   Tobacco Use  Smoking Status Current Every Day Smoker  . Packs/day: 0.25  . Years: 23.00  . Pack years: 5.75  . Types: Cigarettes  Smokeless Tobacco Never Used        Objective:     Wt Readings from Last 3 Encounters:  05/30/19 202 lb 2 oz (91.7 kg)  02/27/19 210 lb (95.3 kg)  01/30/19 210 lb (95.3 kg)     Exam deferred. Pt. Harboring due to COVID 19. Phone visit performed.   Assessment & Plan:   1. Type 2 diabetes mellitus with diabetic polyneuropathy, without long-term current use of insulin ([place])   2. Type 2 diabetes mellitus with hyperglycemia, without long-term current use of insulin (HCC)     Meds ordered this encounter  Medications  . empagliflozin (JARDIANCE) 10 MG TABS tablet    Sig: Take 10 mg by mouth daily. To control diabetes    Dispense:  30 tablet    Refill:  2    Orders Placed This Encounter  Procedures  . Bayer DCA Hb A1c Waived  . CBC with Differential/Platelet  . CMP14+EGFR    Order Specific Question:   Has the patient fasted?    Answer:   Yes  . TSH      Diagnoses and all orders for this visit:  Type 2 diabetes mellitus with diabetic polyneuropathy, without long-term current use of insulin (HCC) -     Bayer DCA Hb A1c Waived -     CBC with Differential/Platelet -     CMP14+EGFR -     TSH -  empagliflozin (JARDIANCE) 10 MG TABS tablet; Take 10 mg by mouth daily. To control diabetes  Type 2 diabetes mellitus with hyperglycemia, without long-term current use of insulin (HCC) -     Bayer DCA Hb A1c Waived -     CBC with Differential/Platelet -     CMP14+EGFR -     TSH -     empagliflozin (JARDIANCE) 10 MG TABS tablet; Take 10 mg by mouth daily. To control diabetes   Patient with multiple medical concerns focusing on diabetes today.  She is stable with regard to her back pain since her surgery.  However she is having some hot flashes and is concerned they may be coming from her Metformin.  She heard recently that it causes cancer and so she wants to quit taking it.  She also has seen good news about Jardiance and wants to try that.  She says that her sugars recently have been out of control although  she had not been checking them for 3 to 6 months prior to the recent readings but assumed that they were normal.  In short she has become hyperglycemic and I encouraged her to go ahead and make the switch in medicine but that she needs to start checking fasting and postprandial blood sugars as well.  She should drop in for her blood work as noted above and will look into the hot flashes is possibly coming from thyroid disease or menopause.  Follow-up in 4 to 6 weeks.  Virtual Visit via telephone Note  I discussed the limitations, risks, security and privacy concerns of performing an evaluation and management service by telephone and the availability of in person appointments. The patient was identified with two identifiers. Pt.expressed understanding and agreed to proceed. Pt. Is at home. Dr. [name] is in his office.  Follow Up Instructions:   I discussed the assessment and treatment plan with the patient. The patient was provided an opportunity to ask questions and all were answered. The patient agreed with the plan and demonstrated an understanding of the instructions.   The patient was advised to call back or seek an in-person evaluation if the symptoms worsen or if the condition fails to improve as anticipated.   Total minutes including chart review and phone contact time: 26   Follow up plan: No follow-ups on file.  Claretta Fraise, MD [place]

## 2019-10-15 ENCOUNTER — Other Ambulatory Visit: Payer: Self-pay

## 2019-10-15 ENCOUNTER — Other Ambulatory Visit (INDEPENDENT_AMBULATORY_CARE_PROVIDER_SITE_OTHER): Payer: BC Managed Care – PPO

## 2019-10-15 DIAGNOSIS — E1142 Type 2 diabetes mellitus with diabetic polyneuropathy: Secondary | ICD-10-CM | POA: Diagnosis not present

## 2019-10-15 DIAGNOSIS — E1165 Type 2 diabetes mellitus with hyperglycemia: Secondary | ICD-10-CM | POA: Diagnosis not present

## 2019-10-15 DIAGNOSIS — Z029 Encounter for administrative examinations, unspecified: Secondary | ICD-10-CM

## 2019-10-15 LAB — BAYER DCA HB A1C WAIVED: HB A1C (BAYER DCA - WAIVED): 7.2 % — ABNORMAL HIGH (ref <7.0)

## 2019-10-15 NOTE — Progress Notes (Signed)
Hello  Aimee [name],    Your lab result is normal and/or stable.Some minor variations that are not significant are commonly marked abnormal, but do not represent any medical problem for you.   Best regards,  Claretta Fraise, M.D.

## 2019-10-16 ENCOUNTER — Encounter: Payer: Self-pay | Admitting: Family Medicine

## 2019-10-16 LAB — CBC WITH DIFFERENTIAL/PLATELET
Basophils Absolute: 0.1 10*3/uL (ref 0.0–0.2)
Basos: 1 %
EOS (ABSOLUTE): 0.2 10*3/uL (ref 0.0–0.4)
Eos: 2 %
Hematocrit: 45.9 % (ref 34.0–46.6)
Hemoglobin: 16.1 g/dL — ABNORMAL HIGH (ref 11.1–15.9)
Immature Grans (Abs): 0 10*3/uL (ref 0.0–0.1)
Immature Granulocytes: 0 %
Lymphocytes Absolute: 3.4 10*3/uL — ABNORMAL HIGH (ref 0.7–3.1)
Lymphs: 43 %
MCH: 33 pg (ref 26.6–33.0)
MCHC: 35.1 g/dL (ref 31.5–35.7)
MCV: 94 fL (ref 79–97)
Monocytes Absolute: 0.4 10*3/uL (ref 0.1–0.9)
Monocytes: 4 %
Neutrophils Absolute: 4 10*3/uL (ref 1.4–7.0)
Neutrophils: 50 %
Platelets: 347 10*3/uL (ref 150–450)
RBC: 4.88 x10E6/uL (ref 3.77–5.28)
RDW: 12.2 % (ref 11.7–15.4)
WBC: 8 10*3/uL (ref 3.4–10.8)

## 2019-10-16 LAB — CMP14+EGFR
ALT: 47 IU/L — ABNORMAL HIGH (ref 0–32)
AST: 27 IU/L (ref 0–40)
Albumin/Globulin Ratio: 1.5 (ref 1.2–2.2)
Albumin: 4.5 g/dL (ref 3.8–4.8)
Alkaline Phosphatase: 56 IU/L (ref 39–117)
BUN/Creatinine Ratio: 16 (ref 9–23)
BUN: 11 mg/dL (ref 6–24)
Bilirubin Total: 0.3 mg/dL (ref 0.0–1.2)
CO2: 24 mmol/L (ref 20–29)
Calcium: 9.6 mg/dL (ref 8.7–10.2)
Chloride: 105 mmol/L (ref 96–106)
Creatinine, Ser: 0.67 mg/dL (ref 0.57–1.00)
GFR calc Af Amer: 120 mL/min/{1.73_m2} (ref >59)
GFR calc non Af Amer: 104 mL/min/{1.73_m2} (ref >59)
Globulin, Total: 3.1 g/dL (ref 1.5–4.5)
Glucose: 114 mg/dL — ABNORMAL HIGH (ref 65–99)
Potassium: 4.2 mmol/L (ref 3.5–5.2)
Sodium: 143 mmol/L (ref 134–144)
Total Protein: 7.6 g/dL (ref 6.0–8.5)

## 2019-10-16 LAB — TSH: TSH: 0.713 u[IU]/mL (ref 0.450–4.500)

## 2019-10-16 NOTE — Progress Notes (Signed)
Hello Aimee [name], Your labs look good. Your A1c is trending upward. At 7.2 it is still in a fairly good range, though. Hopefully the new medication will help get to the goal of 7.0 or less. Best Regards, Claretta Fraise, M.D.

## 2019-10-17 ENCOUNTER — Encounter: Payer: Self-pay | Admitting: Orthopaedic Surgery

## 2019-10-18 ENCOUNTER — Telehealth: Payer: Self-pay | Admitting: Orthopaedic Surgery

## 2019-10-18 NOTE — Telephone Encounter (Signed)
Received call from [facility] w/ Lilyan Punt checking on records request. I advised Ciox processed on 09/23/19. She has not received. I refaxed records to [phone]- [phone]

## 2019-10-19 ENCOUNTER — Encounter: Payer: Self-pay | Admitting: Family Medicine

## 2019-10-21 ENCOUNTER — Encounter: Payer: Self-pay | Admitting: Family Medicine

## 2019-10-22 ENCOUNTER — Encounter: Payer: Self-pay | Admitting: Family Medicine

## 2019-10-22 ENCOUNTER — Other Ambulatory Visit: Payer: Self-pay

## 2019-10-22 ENCOUNTER — Encounter: Payer: Self-pay | Admitting: Orthopaedic Surgery

## 2019-10-22 ENCOUNTER — Other Ambulatory Visit: Payer: Self-pay | Admitting: Family Medicine

## 2019-10-22 ENCOUNTER — Ambulatory Visit: Payer: Self-pay

## 2019-10-22 ENCOUNTER — Ambulatory Visit: Payer: BC Managed Care – PPO | Admitting: Orthopaedic Surgery

## 2019-10-22 VITALS — Ht 68.0 in | Wt 197.0 lb

## 2019-10-22 DIAGNOSIS — M25552 Pain in left hip: Secondary | ICD-10-CM | POA: Diagnosis not present

## 2019-10-22 DIAGNOSIS — M1612 Unilateral primary osteoarthritis, left hip: Secondary | ICD-10-CM | POA: Insufficient documentation

## 2019-10-22 DIAGNOSIS — G8929 Other chronic pain: Secondary | ICD-10-CM | POA: Diagnosis not present

## 2019-10-22 DIAGNOSIS — M25562 Pain in left knee: Secondary | ICD-10-CM

## 2019-10-22 MED ORDER — BACLOFEN 10 MG PO TABS: 10.0000 mg | ORAL_TABLET | Freq: Three times a day (TID) | ORAL | 1 refills | Status: AC

## 2019-10-22 NOTE — Progress Notes (Signed)
Office Visit Note   Patient: Aimee [name]           Date of Birth: 1970-11-26           MRN: [id number] Visit Date: 10/22/2019              Requested by: Claretta Fraise, MD [place],  [place] [id number] PCP: Claretta Fraise, MD   Assessment & Plan: Visit Diagnoses:  1. Pain in left hip   2. Chronic pain of left knee   3. Unilateral primary osteoarthritis, left hip     Plan: Patient has some mild to moderate hip osteoarthritis but is not limping has a reasonable range of motion and no severe pain on the extremes of internal rotation.  We discussed using a cane in the opposite hand she is already on Voltaren 75 mg p.o. twice daily with meals.  She can continue to work on walking when she has had cortisone injections in the past she has had associated weight gain.  We discussed using exercise bike.  She can return if she has increased problems.  She is happy that she has gotten some relief of her chronic back pain with the spinal cord stimulator.  Follow-Up Instructions: Return in about 3 months (around 01/19/2020).   Orders:  Orders Placed This Encounter  Procedures  . XR HIP UNILAT W OR W/O PELVIS 2-3 VIEWS LEFT  . XR Knee 1-2 Views Left   No orders of the defined types were placed in this encounter.     Procedures: No procedures performed   Clinical Data: No additional findings.   Subjective: Chief Complaint  Patient presents with  . Neck - Pain  . Left Shoulder - Pain  . Left Hip - Pain  . Left Knee - Pain    HPI 49 year old female returns with history of chronic back pain.  She states she got a spinal cord stimulator placed by Dr. Vira Blanco  in [place] and states she got tremendous relief of pain and was able to get off her Nucynta.  However she adds that she has had terrible pain in her left buttocks left groin radiates down her left leg.  She states she has had previous intra-articular hip injections in the past.  She states she is able to walk and does  not limp but at times she has sharp pain and feels like it will catch particularly with rotation  Hip activities.  Last MRI we had March 2020 showed some mild left lateral recess L4-5 mild foraminal disc L3-4 no central compression and mild facet changes.  Patient is here with her granddaughter and she states she is taking care of her this week while patient's daughter works as a Marine scientist.  Review of Systems 14 point update unchanged from 02/27/2019 other than new spinal cord stimulator.   Objective: Vital Signs: Ht 5' 8"  (1.727 m)   Wt 197 lb (89.4 kg)   BMI 29.95 kg/m   Physical Exam Constitutional:      Appearance: She is well-developed.  HENT:     Head: Normocephalic.     Right Ear: External ear normal.     Left Ear: External ear normal.  Eyes:     Pupils: Pupils are equal, round, and reactive to light.  Neck:     Thyroid: No thyromegaly.     Trachea: No tracheal deviation.  Cardiovascular:     Rate and Rhythm: Normal rate.  Pulmonary:     Effort: Pulmonary effort  is normal.  Abdominal:     Palpations: Abdomen is soft.  Skin:    General: Skin is warm and dry.  Neurological:     Mental Status: She is alert and oriented to person, place, and time.  Psychiatric:        Behavior: Behavior normal.     Ortho Exam patient gets up easily from sitting to standing walks heel toe gait turns rapidly without pain.  Negative Trendelenburg.  She complains of discomfort with hip internal and external rotation which is 25 degrees internal and external rotation and symmetrical with her opposite right hip which is mildly uncomfortable with range of motion.  Negative straight leg raising 90 degrees.  Specialty Comments:  No specialty comments available.  Imaging: XR HIP UNILAT W OR W/O PELVIS 2-3 VIEWS LEFT  Result Date: 10/22/2019 Standing AP both hips and frog-leg lateral left hip demonstrates left hip joint spurring with subsubchondral sclerosis no subchondral cyst formation.  Negative  for AVN.  Minimal changes right hip. Impression: Mild to moderate left hip arthritis with osteophyte formation.  XR Knee 1-2 Views Left  Result Date: 10/22/2019 Standing AP both knees lateral left knee obtained and reviewed.  This shows trace joint space joint space narrowing minimal spurring both knees.  Negative for acute changes. Impression: Slight joint space narrowing both knees.    PMFS History: Patient Active Problem List   Diagnosis Date Noted  . Unilateral primary osteoarthritis, left hip 10/22/2019  . Hx of fusion of cervical spine 02/27/2019  . Tendinopathy of rotator cuff, left 02/27/2019  . Aortic atherosclerosis ([place]) 05/22/2018  . Ganglion cyst of finger 09/13/2017  . Status post placement of bone anchored hearing aid (BAHA) 08/14/2017  . Fibromyalgia 11/29/2016  . Lumbar radiculopathy 11/21/2016  . Essential hypertension 04/22/2016  . Severe obesity (BMI >= 40) ([place]) 10/08/2015  . PTSD (post-traumatic stress disorder) 09/10/2015  . Trapezius strain 08/19/2015  . Depression 08/19/2015  . Hypertriglyceridemia 11/12/2014  . Peripheral neuropathy 11/12/2014  . DDD (degenerative disc disease), lumbar 11/12/2014  . Varicose veins of leg with pain, bilateral 05/12/2014  . Diabetes ([place]) 03/05/2013  . Smoking 03/05/2013   Past Medical History:  Diagnosis Date  . Arthritis   . Depression   . Diabetes mellitus    type II   . Family history of adverse reaction to anesthesia    mother- mother has lots of alleriges   . GERD (gastroesophageal reflux disease)   . Hypertension   . Hypertriglyceridemia   . Neuromuscular disorder ([place])    diabetic neuropathy   . PONV (postoperative nausea and vomiting)   . PTSD (post-traumatic stress disorder)   . PTSD (post-traumatic stress disorder)     Family History  Problem Relation Age of Onset  . GI problems Mother   . Mental illness Mother   . Bipolar disorder Mother   . Diabetes Father   . Breast cancer Maternal Aunt         in 85's  . Liver cancer Maternal Aunt   . Bipolar disorder Maternal Aunt   . Schizophrenia Maternal Aunt   . Personality disorder Maternal Aunt   . Colon cancer Maternal Grandmother   . Heart attack Maternal Grandfather   . Heart attack Paternal Grandmother   . Heart attack Paternal Grandfather   . Heart attack Paternal Uncle   . Lymphoma Paternal Uncle   . Bone cancer Cousin     Past Surgical History:  Procedure Laterality Date  . ABDOMINAL HYSTERECTOMY  2010  . ABDOMINOPLASTY  2006  . BREAST EXCISIONAL BIOPSY Right   . [employer]   x2  . CHOLECYSTECTOMY N/A 01/11/2018   Procedure: LAPAROSCOPIC CHOLECYSTECTOMY;  Surgeon: [name], MD;  Location: [facility];  Service: General;  Laterality: N/A;  PT HAS PTSD AND IS AGGREGATED BY WAITING FOR LONG PERIODS OF TIME.  [facility] EAR SURGERY     12 surgeries on R ear starting in 1984; implant 06/2011  . left  carpal tunnel surgery     . LIVER BIOPSY N/A 01/11/2018   Procedure: LIVER BIOPSY;  Surgeon: [name], MD;  Location: [facility];  Service: General;  Laterality: N/A;  . SHOULDER ARTHROSCOPY Left 10/2015  . THORACIC [place] [facility]GERY  2011, 2012   x2  . TONSILLECTOMY  12/26/2017  . TUBAL LIGATION     Social History   Occupational History    Comment: [employer]  Tobacco Use  . Smoking status: Current Every Day Smoker    Packs/day: 0.25    Years: 23.00    Pack years: 5.75    Types: Cigarettes  . Smokeless tobacco: Never Used  Substance and Sexual Activity  . Alcohol use: No    Alcohol/week: 0.0 standard drinks  . Drug use: No  . Sexual activity: Yes    Partners: Male

## 2019-10-23 MED ORDER — DICLOFENAC SODIUM 75 MG PO TBEC: 75.0000 mg | DELAYED_RELEASE_TABLET | Freq: Two times a day (BID) | ORAL | 0 refills | Status: DC

## 2019-10-24 ENCOUNTER — Other Ambulatory Visit: Payer: Self-pay

## 2019-10-24 ENCOUNTER — Ambulatory Visit (INDEPENDENT_AMBULATORY_CARE_PROVIDER_SITE_OTHER): Payer: BC Managed Care – PPO | Admitting: Psychology

## 2019-10-24 DIAGNOSIS — F4310 Post-traumatic stress disorder, unspecified: Secondary | ICD-10-CM

## 2019-10-24 DIAGNOSIS — F319 Bipolar disorder, unspecified: Secondary | ICD-10-CM | POA: Diagnosis not present

## 2019-10-24 NOTE — Progress Notes (Signed)
Virtual Visit via Telephone Note  I connected with Aimee [name] on 10/24/19 at  2:30 PM EST by telephone and verified that I am speaking with the correct person using two identifiers.   I discussed the limitations, risks, security and privacy concerns of performing an evaluation and management service by telephone and the availability of in person appointments. I also discussed with the patient that there may be a patient responsible charge related to this service. The patient expressed understanding and agreed to proceed.    I discussed the assessment and treatment plan with the patient. The patient was provided an opportunity to ask questions and all were answered. The patient agreed with the plan and demonstrated an understanding of the instructions.   The patient was advised to call back or seek an in-person evaluation if the symptoms worsen or if the condition fails to improve as anticipated.  I provided 54 minutes of non-face-to-face time during this encounter.   [name] [facility]    THERAPIST PROGRESS NOTE  Session Time: 2.30pm-3.24pm  Participation Level: Active  Behavioral Response: NAAlertaffect bright  Type of Therapy: Individual Therapy  Treatment Goals addressed: Diagnosis: Bipolar d/o and goal 1.  Interventions: CBT, Strength-based and Supportive  Summary: Aimee [name] is a 49 y.o. female who presents with affect. Pt is talkative and discussed that she has does have increased energy and doing a lot of cleaning and reorganizing.  Pt reported that she recognizes may be hypomanic.  Pt feels that a lot of it is stressors of waiting for answers w/ disability, etc.  Pt recognized things are important for her to focus on w/ self care and what is in her control.  Pt shared some positives w/her daughter getting married and that day going well and watching her granddaughter while her youngest has started back to work.    Suicidal/Homicidal: Nowithout  intent/plan  Therapist Response: assessed pt current functioning per pt report. Explored w/ pt recent and ongoing stressors.  Discussed w/pt her mood and hypomanic symptoms.  Processed w/pt use of her coping skills and reaching out to dr arfeen if needed.    Plan: Return again in 4 weeks, via webex.  Pt to f/u as scheduled w/ Dr. Adele Schilder.  Diagnosis: Bipolar 1 d/o and goal 1.   [name] [facility] 10/24/2019

## 2019-10-29 ENCOUNTER — Encounter: Payer: Self-pay | Admitting: Family Medicine

## 2019-10-29 DIAGNOSIS — Z6830 Body mass index (BMI) 30.0-30.9, adult: Secondary | ICD-10-CM | POA: Diagnosis not present

## 2019-10-29 DIAGNOSIS — M542 Cervicalgia: Secondary | ICD-10-CM | POA: Diagnosis not present

## 2019-10-31 ENCOUNTER — Ambulatory Visit: Payer: BC Managed Care – PPO | Admitting: Family Medicine

## 2019-11-01 ENCOUNTER — Telehealth (HOSPITAL_COMMUNITY): Payer: Self-pay

## 2019-11-01 NOTE — Telephone Encounter (Signed)
S/W MONIQUA FROM [employer] SHE STATED THAT THERE IS A PAID CLAIM AT THE PHARMACY FOR 10/09/19. HER INSURANCE TERMED IN 2019 SO CLAIM WAS ABORTED OUT ON 10/16/19. PT NOW HAS NEW INSURANCE. SHE ALSO STATED THAT PATIENT'S INSURANCE WILL PAY FOR A 30 OR 45 DAY SUPPLY WITHOUT A PRIOR AUTHORIZATION REQUIRED FOR PATIENT'S CITALOPRAM 40MG TABLET. HOWEVER, IF PATIENT WANTS A 90 DAY, A PA WOULD BE REQUIRED.

## 2019-11-13 ENCOUNTER — Encounter: Payer: Self-pay | Admitting: *Deleted

## 2019-11-14 ENCOUNTER — Other Ambulatory Visit (HOSPITAL_COMMUNITY): Payer: Self-pay | Admitting: Psychiatry

## 2019-11-14 ENCOUNTER — Other Ambulatory Visit: Payer: Self-pay | Admitting: Family Medicine

## 2019-11-14 DIAGNOSIS — F4310 Post-traumatic stress disorder, unspecified: Secondary | ICD-10-CM

## 2019-11-14 DIAGNOSIS — F319 Bipolar disorder, unspecified: Secondary | ICD-10-CM

## 2019-11-25 ENCOUNTER — Encounter: Payer: Self-pay | Admitting: Family Medicine

## 2019-11-25 ENCOUNTER — Other Ambulatory Visit: Payer: Self-pay | Admitting: *Deleted

## 2019-11-25 MED ORDER — FENOFIBRATE 160 MG PO TABS: 160.0000 mg | ORAL_TABLET | Freq: Every day | ORAL | 1 refills | Status: AC

## 2019-11-25 MED ORDER — OMEPRAZOLE 20 MG PO CPDR: 20.0000 mg | DELAYED_RELEASE_CAPSULE | Freq: Every day | ORAL | 1 refills | Status: AC

## 2019-11-25 MED ORDER — ATORVASTATIN CALCIUM 40 MG PO TABS: 40.0000 mg | ORAL_TABLET | Freq: Every day | ORAL | 1 refills | Status: DC

## 2019-11-25 MED ORDER — EZETIMIBE 10 MG PO TABS: 10.0000 mg | ORAL_TABLET | Freq: Every day | ORAL | 1 refills | Status: AC

## 2019-11-26 ENCOUNTER — Encounter (HOSPITAL_COMMUNITY): Payer: Self-pay | Admitting: Psychiatry

## 2019-11-26 ENCOUNTER — Other Ambulatory Visit: Payer: Self-pay

## 2019-11-26 ENCOUNTER — Ambulatory Visit (INDEPENDENT_AMBULATORY_CARE_PROVIDER_SITE_OTHER): Payer: BC Managed Care – PPO | Admitting: Psychiatry

## 2019-11-26 DIAGNOSIS — F319 Bipolar disorder, unspecified: Secondary | ICD-10-CM

## 2019-11-26 DIAGNOSIS — F4310 Post-traumatic stress disorder, unspecified: Secondary | ICD-10-CM | POA: Diagnosis not present

## 2019-11-26 MED ORDER — CITALOPRAM HYDROBROMIDE 40 MG PO TABS: 40.0000 mg | ORAL_TABLET | Freq: Every day | ORAL | 0 refills | Status: DC

## 2019-11-26 MED ORDER — HYDROXYZINE PAMOATE 50 MG PO CAPS: ORAL_CAPSULE | ORAL | 0 refills | Status: DC

## 2019-11-26 MED ORDER — LAMOTRIGINE 100 MG PO TABS: 100.0000 mg | ORAL_TABLET | Freq: Every day | ORAL | 0 refills | Status: DC
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
## 2019-11-27 DIAGNOSIS — M47812 Spondylosis without myelopathy or radiculopathy, cervical region: Secondary | ICD-10-CM | POA: Diagnosis not present

## 2019-11-28 ENCOUNTER — Ambulatory Visit: Payer: BC Managed Care – PPO | Admitting: Physician Assistant

## 2019-11-28 ENCOUNTER — Other Ambulatory Visit: Payer: Self-pay

## 2019-11-28 ENCOUNTER — Encounter: Payer: Self-pay | Admitting: Physician Assistant

## 2019-11-28 DIAGNOSIS — L570 Actinic keratosis: Secondary | ICD-10-CM

## 2019-11-28 DIAGNOSIS — L732 Hidradenitis suppurativa: Secondary | ICD-10-CM | POA: Diagnosis not present

## 2019-11-28 DIAGNOSIS — Z1283 Encounter for screening for malignant neoplasm of skin: Secondary | ICD-10-CM

## 2019-11-28 DIAGNOSIS — L918 Other hypertrophic disorders of the skin: Secondary | ICD-10-CM

## 2019-11-28 NOTE — Progress Notes (Signed)
   Follow-Up Visit   Subjective  Aimee [name] is a 49 y.o. female who presents for the following: Annual Exam (yearly skin check , last visit 08/22/2018) and Skin Tag  (under right arm).  Hidradenitis pimples and blackheads between legs and axillae x years- no treatment.  The following portions of the chart were reviewed this encounter and updated as appropriate: Tobacco  Allergies  Problems  Med Hx  Surg Hx  Fam Hx      Review of Systems: No other skin or systemic complaints.  Objective  Well appearing patient in no apparent distress; mood and affect are within normal limits.  A full examination was performed including scalp, head, eyes, ears, nose, lips, neck, chest, axillae, abdomen, back, buttocks, bilateral upper extremities, bilateral lower extremities, hands, feet, fingers, toes, fingernails, and toenails. All findings within normal limits unless otherwise noted below. No atypical nevi noted at the time of the visit.  Objective  Left Lower Leg - Anterior (2), Right Lower Leg - Anterior: Erythematous patches with gritty scale.  Assessment & Plan  AK (actinic keratosis) (3) Left Lower Leg - Anterior (2); Right Lower Leg - Anterior  Destruction of lesion - Left Lower Leg - Anterior (2), Right Lower Leg - Anterior Complexity: simple   Destruction method: cryotherapy   Informed consent: discussed and consent obtained   Timeout:  patient name, date of birth, surgical site, and procedure verified Lesion destroyed using liquid nitrogen: Yes   Cryotherapy cycles:  1 Outcome: patient tolerated procedure well with no complications   Post-procedure details: wound care instructions given    Hidradenitis suppurativa comedomes located on both inner thighs and axillae x years, no treatment. Antibiotics prn for flares  Skin tag- R axillae, snipped irritated skin growth

## 2019-11-28 NOTE — Patient Instructions (Signed)
Wound Care Instructions  1. Cleanse would gently with soap and water once a day then pat dry with clean gauze. Apply a thing coat of Petrolatum (petroleum jelly, "Vaseline") over the wound (unless you have an allergy to this). We recommend that you use a new, sterile tube of Vaseline. Do not pick or remove scabs. Do not remove the yellow or white "healing tissue" from the base of the wound.  2. Cover the wound with fresh, clean, nonstick gauze and secure with paper tape. You may use Band-Aids in place of gauze and tape if the would is small enough, but would recommend trimming much of the tape off as there is often too much. Sometimes Band-Aids can irritate the skin.  3. You should call the office for your biopsy report after 1 week if you have not already been contacted.  4. If you experience any problems, such as abnormal amounts of bleeding, swelling, significant bruising, significant pain, or evidence of infection, please call the office immediately.  5. FOR ADULT SURGERY PATIENTS: If you need something for pain relief you may take 1 extra strength Tylenol (acetaminophen) AND 2 Ibuprofen (271m each) together every 4 hours as needed for pain. (do not take these if you are allergic to them or if you have a reason you should not take them.) Typically, you may only need pain medication for 1 to 3 days.

## 2019-12-02 DIAGNOSIS — M47812 Spondylosis without myelopathy or radiculopathy, cervical region: Secondary | ICD-10-CM | POA: Diagnosis not present

## 2019-12-03 ENCOUNTER — Other Ambulatory Visit: Payer: Self-pay

## 2019-12-03 ENCOUNTER — Ambulatory Visit (INDEPENDENT_AMBULATORY_CARE_PROVIDER_SITE_OTHER): Payer: BC Managed Care – PPO | Admitting: Psychology

## 2019-12-03 DIAGNOSIS — F319 Bipolar disorder, unspecified: Secondary | ICD-10-CM | POA: Diagnosis not present

## 2019-12-03 NOTE — Progress Notes (Signed)
Virtual Visit via Telephone Note  I connected with Aimee [name] on 12/03/19 at  2:30 PM EDT by telephone and verified that I am speaking with the correct person using two identifiers.   I discussed the limitations, risks, security and privacy concerns of performing an evaluation and management service by telephone and the availability of in person appointments. I also discussed with the patient that there may be a patient responsible charge related to this service. The patient expressed understanding and agreed to proceed.    I discussed the assessment and treatment plan with the patient. The patient was provided an opportunity to ask questions and all were answered. The patient agreed with the plan and demonstrated an understanding of the instructions.   The patient was advised to call back or seek an in-person evaluation if the symptoms worsen or if the condition fails to improve as anticipated.  I provided 53 minutes of non-face-to-face time during this encounter.   [name] [facility]    THERAPIST PROGRESS NOTE  Session Time: 2.30pm-3.23pm  Participation Level: Active  Behavioral Response: NAAlertaffect wnl  Type of Therapy: Individual Therapy  Treatment Goals addressed: Diagnosis: bipolar 1 d/o and goal 1.  Interventions: CBT and Strength-based  Summary: Aimee [name] is a 49 y.o. female who presents with affect wnl.  pt reported that she is sleeping better and feels that she is doing better w/ mood.  Pt reported husband complains that she is being too sensitive.  Pt discussed interactions and how she communicates when she has a problem with his tone and good awareness that husband may be struggle w/ grief.  Pt discussed some frustrations w/ still awaiting disability decision.    Suicidal/Homicidal: Nowithout intent/plan  Therapist Response: Assessed pt current functioning per pt report.  Processed w/ stressors recent and interactions w/ her husband.  Reflected pt  improvement w/ mood and how she is handling conflicts.    Plan: Return again in 4 weeks, via telephone.  F/u as scheduled w/ Dr. Adele Schilder.  Diagnosis: Bipolar 1 d/o.   [name] [facility] 12/03/2019

## 2019-12-04 ENCOUNTER — Encounter: Payer: Self-pay | Admitting: Family Medicine

## 2019-12-10 ENCOUNTER — Encounter: Payer: Self-pay | Admitting: Family Medicine

## 2019-12-17 ENCOUNTER — Other Ambulatory Visit: Payer: Self-pay | Admitting: Family Medicine

## 2019-12-17 DIAGNOSIS — Z1231 Encounter for screening mammogram for malignant neoplasm of breast: Secondary | ICD-10-CM

## 2019-12-17 DIAGNOSIS — M47812 Spondylosis without myelopathy or radiculopathy, cervical region: Secondary | ICD-10-CM | POA: Diagnosis not present

## 2019-12-19 ENCOUNTER — Other Ambulatory Visit: Payer: Self-pay | Admitting: Family Medicine

## 2019-12-19 ENCOUNTER — Ambulatory Visit: Payer: BC Managed Care – PPO | Admitting: Obstetrics and Gynecology

## 2019-12-19 ENCOUNTER — Encounter: Payer: Self-pay | Admitting: Obstetrics and Gynecology

## 2019-12-19 ENCOUNTER — Other Ambulatory Visit: Payer: Self-pay

## 2019-12-19 VITALS — BP 105/75 | HR 90 | Ht 68.0 in | Wt 194.6 lb

## 2019-12-19 DIAGNOSIS — L732 Hidradenitis suppurativa: Secondary | ICD-10-CM | POA: Diagnosis not present

## 2019-12-19 MED ORDER — AMOXICILLIN-POT CLAVULANATE 500-125 MG PO TABS: 500.0000 mg | ORAL_TABLET | Freq: Three times a day (TID) | ORAL | 0 refills | Status: DC

## 2019-12-19 NOTE — Progress Notes (Signed)
Patient ID: Aimee [name], female   DOB: May 26, 1971, 49 y.o.   MRN: [id number]    [facility] Visit  @DATE @            Patient name: Aimee [name] MRN [id number]  Date of birth: 1971/06/28  CC & HPI:  Aimee [name] is a 49 y.o. female presenting today for cysts around the vagina.  Hx hidradenitis suppurativa (HS)of genitalia, so it is unsure if a bartholins or HS She notes that she has three places. She went to [facility] because she notes that one cyst was extremely large.  She was placed on Amoxicillin.   Now out of Amoxicillin  ROS:  ROS   Pertinent History Reviewed:   Reviewed: Significant for recurrent lesions Medical         Past Medical History:  Diagnosis Date  . Arthritis   . Atypical mole 10/31/2012   R neck-mod. ([facility])  . Depression   . Diabetes mellitus    type II   . Family history of adverse reaction to anesthesia    mother- mother has lots of alleriges   . GERD (gastroesophageal reflux disease)   . Hidradenitis suppurativa   . Hidradenitis suppurativa   . Hypertension   . Hypertriglyceridemia   . Neuromuscular disorder ([place])    diabetic neuropathy   . PONV (postoperative nausea and vomiting)   . PTSD (post-traumatic stress disorder)   . PTSD (post-traumatic stress disorder)                               Surgical Hx:    Past Surgical History:  Procedure Laterality Date  . ABDOMINAL HYSTERECTOMY  2010  . ABDOMINOPLASTY  2006  . BREAST EXCISIONAL BIOPSY Right   . [place]   x2  . CHOLECYSTECTOMY N/A 01/11/2018   Procedure: LAPAROSCOPIC CHOLECYSTECTOMY;  Surgeon: [name], MD;  Location: [facility];  Service: General;  Laterality: N/A;  PT HAS PTSD AND IS AGGREGATED BY WAITING FOR LONG PERIODS OF TIME.  [facility] EAR SURGERY     12 surgeries on R ear starting in 1984; implant 06/2011  . left  carpal tunnel surgery     . LIVER BIOPSY N/A 01/11/2018   Procedure: LIVER BIOPSY;  Surgeon: [name], MD;  Location: [facility];   Service: General;  Laterality: N/A;  . SHOULDER ARTHROSCOPY Left 10/2015  . THORACIC [place] SURGERY  2011, 2012   x2  . TONSILLECTOMY  12/26/2017  . TUBAL LIGATION     Medications: Reviewed & Updated - see associated section                       Current Outpatient Medications:  .  amLODipine (NORVASC) 5 MG tablet, Take 1 tablet (5 mg total) by mouth daily., Disp: 90 tablet, Rfl: 0 .  aspirin 81 MG EC tablet, TAKE 1 TABLET BY MOUTH EVERY DAY, Disp: 90 tablet, Rfl: 1 .  atorvastatin (LIPITOR) 40 MG tablet, Take 1 tablet (40 mg total) by mouth daily., Disp: 90 tablet, Rfl: 1 .  baclofen (LIORESAL) 10 MG tablet, Take 1 tablet (10 mg total) by mouth 3 (three) times daily., Disp: 270 tablet, Rfl: 1 .  citalopram (CELEXA) 40 MG tablet, Take 1 tablet (40 mg total) by mouth daily. (Needs to be seen before next refill), Disp: 90 tablet, Rfl: 0 .  cyanocobalamin (,VITAMIN B-12,) 1000 MCG/ML injection, Inject  1 mL (1,000 mcg total) into the muscle every 30 (thirty) days., Disp: 3 mL, Rfl: 3 .  diclofenac (VOLTAREN) 75 MG EC tablet, TAKE 1 TABLET TWICE A DAY, Disp: 180 tablet, Rfl: 0 .  diclofenac sodium (VOLTAREN) 1 % GEL, Apply 4 g topically 4 (four) times daily. As needed for neck and back pain, Disp: 100 g, Rfl: 1 .  dicyclomine (BENTYL) 10 MG capsule, TAKE ONE CAPSULE THREE TIMES A DAY AS NEEDED FOR ABDOMINAL CRAMPING AND DIARRHEA, Disp: 270 capsule, Rfl: 1 .  empagliflozin (JARDIANCE) 10 MG TABS tablet, Take 10 mg by mouth daily. To control diabetes, Disp: 30 tablet, Rfl: 2 .  ezetimibe (ZETIA) 10 MG tablet, Take 1 tablet (10 mg total) by mouth daily., Disp: 90 tablet, Rfl: 1 .  fenofibrate 160 MG tablet, Take 1 tablet (160 mg total) by mouth daily. For cholesterol and triglyceride, Disp: 90 tablet, Rfl: 1 .  glucose blood test strip, Use as instructed, Disp: 200 each, Rfl: 3 .  hydrOXYzine (VISTARIL) 50 MG capsule, Take one in am for anxiety and two at bed time., Disp: 270 capsule, Rfl: 0 .   lamoTRIgine (LAMICTAL) 100 MG tablet, Take 1 tablet (100 mg total) by mouth daily., Disp: 90 tablet, Rfl: 0 .  Lancets MISC, Use to check blood sugars twice daily, Disp: 200 each, Rfl: 3 .  lidocaine (LIDODERM) 5 %, PLACE 1 PATCH ONTO THE SKIN DAILY. REMOVE & DISCARD PATCH WITHIN 12 HOURS OR AS DIRECTED BY MD, Disp: 30 patch, Rfl: 5 .  NARCAN 4 MG/0.1ML LIQD nasal spray kit, USE 1 (ONE) SPRAY AS NEEDED FOR ACCIDENTAL OVERDOSE, Disp: , Rfl: 0 .  omeprazole (PRILOSEC) 20 MG capsule, Take 1 capsule (20 mg total) by mouth daily., Disp: 90 capsule, Rfl: 1   Social History: Reviewed -  reports that she has been smoking cigarettes. She has a 5.75 pack-year smoking history. She has never used smokeless tobacco.  Objective Findings:  Vitals: Blood pressure 105/75, pulse 90, height 5' 8"  (1.727 m), weight 194 lb 9.6 oz (88.3 kg).  PHYSICAL EXAMINATION General appearance - alert, well appearing, and in no distress, oriented to person, place, and time, normal appearing weight and well hydrated Mental status - alert, oriented to person, place, and time, normal mood, behavior, speech, dress, motor activity, and thought processes, affect appropriate to mood Chest - not examined Heart - not examined Abdomen - not examined Breasts - not examined Skin - normal coloration and turgor, no rashes, no suspicious skin lesions noted  PELVIC External genitalia - Left labia majora just outside labia minor.  Vulva - multiple old scars,  Vagina - eroded 1 cm opening in area of introitus at 5 o'clock with deep fissure  8 mm deep, consistent with HS, but may be only bartholin's. Pt to complete 10 d augmenting and reassesses. Cervix - n/a  Uterus -   Adnexa -  [place] -  Rectal -     Assessment & Plan:   A:  1.  HSuppurativa  P:  1. Return in two weeks 2. Rx Augmentin 500 tid    By signing my name below, I, [employer], attest that this documentation has been prepared under the direction and in the  presence of Jonnie Kind, MD. Electronically Signed: [place]. 12/19/19. 11:31 AM.  I personally performed the services described in this documentation, which was SCRIBED in my presence. The recorded information has been reviewed and considered accurate. It has been edited  as necessary during review. Jonnie Kind, MD

## 2019-12-24 ENCOUNTER — Telehealth (HOSPITAL_COMMUNITY): Payer: Self-pay

## 2019-12-24 NOTE — Telephone Encounter (Signed)
Prior authorization sent for Citalopram 22m tablets. PA# [id number]: [id number]

## 2019-12-25 ENCOUNTER — Telehealth (HOSPITAL_COMMUNITY): Payer: Self-pay

## 2019-12-25 NOTE — Telephone Encounter (Signed)
[facility] BLUE CROSS AND BLUE SHIELD PRESCRIPTION COVERAGE DENIED  CITALOPRAM 40MG TABLET REFERENCE# [id number]

## 2019-12-25 NOTE — Telephone Encounter (Signed)
Spoke with [employer] and [facility] d/t prior authorization denial. Representative states prior authorization is approved for 30 tabs/30 days and pt is able to fill that prescription. Representative states medication was filled 12/24/19 for 45 tabs for 45 days. States pt does not need prior authorization unless wanting 90 day supply. If further issues, may call [phone].

## 2019-12-26 ENCOUNTER — Ambulatory Visit
Admission: RE | Admit: 2019-12-26 | Discharge: 2019-12-26 | Disposition: A | Discharge status: Home / Self Care | Payer: BC Managed Care – PPO | Source: Ambulatory Visit | Attending: Family Medicine | Admitting: Family Medicine

## 2019-12-26 ENCOUNTER — Other Ambulatory Visit: Payer: Self-pay

## 2019-12-26 DIAGNOSIS — Z1231 Encounter for screening mammogram for malignant neoplasm of breast: Secondary | ICD-10-CM

## 2019-12-31 DIAGNOSIS — M47812 Spondylosis without myelopathy or radiculopathy, cervical region: Secondary | ICD-10-CM | POA: Diagnosis not present

## 2020-01-01 ENCOUNTER — Other Ambulatory Visit: Payer: Self-pay

## 2020-01-01 ENCOUNTER — Ambulatory Visit: Payer: BC Managed Care – PPO | Admitting: Obstetrics and Gynecology

## 2020-01-01 ENCOUNTER — Encounter: Payer: Self-pay | Admitting: Obstetrics and Gynecology

## 2020-01-01 VITALS — BP 108/75 | HR 78 | Ht 68.0 in | Wt 195.0 lb

## 2020-01-01 DIAGNOSIS — L732 Hidradenitis suppurativa: Secondary | ICD-10-CM

## 2020-01-01 DIAGNOSIS — R102 Pelvic and perineal pain: Secondary | ICD-10-CM

## 2020-01-01 NOTE — Progress Notes (Signed)
Patient ID: Aimee [name], female   DOB: 12-08-1970, 49 y.o.   MRN: [id number]    [facility] Visit  01/01/20            Patient name: Aimee [name] MRN [id number]  Date of birth: Feb 11, 1971  CC & HPI:  Aimee [name] is a 49 y.o. female presenting today for re-evaluation of vulvar  Cysts from chronic hdradenitis of vulva. Also has lesions in arm and mons that have improved since she lost weight.(was size 24).   She has completed Augmentin 500 TID, 2 more pills to treat for any possible infections. She feels like her cysts have improved, but it hasn't gone away completely.  She notes that the areas in question have never been lanced.   ROS:  Review of Systems  Constitutional: Negative for diaphoresis, fever, malaise/fatigue and weight loss.  HENT: Negative for congestion and sore throat.   Eyes: Negative for blurred vision and double vision.  Respiratory: Negative for cough and shortness of breath.   Cardiovascular: Negative for chest pain, palpitations and leg swelling.  Gastrointestinal: Negative for constipation, diarrhea, nausea and vomiting.  Genitourinary: Negative for frequency and urgency.  Musculoskeletal: Negative for back pain, falls and myalgias.  Skin: Negative for rash.  Neurological: Negative for dizziness, weakness and headaches.  Psychiatric/Behavioral: Negative for depression. The patient is not nervous/anxious.      Pertinent History Reviewed:   Reviewed: Significant for hysterectomy, hidradenitis suppurativa Medical         Past Medical History:  Diagnosis Date  . Arthritis   . Atypical mole 10/31/2012   R neck-mod. ([facility])  . Depression   . Diabetes mellitus    type II   . Family history of adverse reaction to anesthesia    mother- mother has lots of alleriges   . GERD (gastroesophageal reflux disease)   . Hidradenitis suppurativa   . Hidradenitis suppurativa   . Hypertension   . Hypertriglyceridemia   . Neuromuscular disorder ([place])    diabetic neuropathy   . PONV (postoperative nausea and vomiting)   . PTSD (post-traumatic stress disorder)   . PTSD (post-traumatic stress disorder)                               Surgical Hx:    Past Surgical History:  Procedure Laterality Date  . ABDOMINAL HYSTERECTOMY  2010  . ABDOMINOPLASTY  2006  . BREAST EXCISIONAL BIOPSY Right   . [place]   x2  . CHOLECYSTECTOMY N/A 01/11/2018   Procedure: LAPAROSCOPIC CHOLECYSTECTOMY;  Surgeon: [name], MD;  Location: [facility];  Service: General;  Laterality: N/A;  PT HAS PTSD AND IS AGGREGATED BY WAITING FOR LONG PERIODS OF TIME.  [facility]     12 surgeries on R ear starting in 1984; implant 06/2011  . left  carpal tunnel surgery     . LIVER BIOPSY N/A 01/11/2018   Procedure: LIVER BIOPSY;  Surgeon: [name], MD;  Location: [facility];  Service: General;  Laterality: N/A;  . SHOULDER ARTHROSCOPY Left 10/2015  . THORACIC [place] SURGERY  2011, 2012   x2  . TONSILLECTOMY  12/26/2017  . TUBAL LIGATION     Medications: Reviewed & Updated - see associated section                       Current Outpatient Medications:  .  amLODipine (NORVASC)  5 MG tablet, Take 1 tablet (5 mg total) by mouth daily., Disp: 90 tablet, Rfl: 0 .  amoxicillin-clavulanate (AUGMENTIN) 500-125 MG tablet, Take 1 tablet (500 mg total) by mouth 3 (three) times daily., Disp: 30 tablet, Rfl: 0 .  aspirin 81 MG EC tablet, TAKE 1 TABLET BY MOUTH EVERY DAY, Disp: 90 tablet, Rfl: 1 .  atorvastatin (LIPITOR) 40 MG tablet, Take 1 tablet (40 mg total) by mouth daily., Disp: 90 tablet, Rfl: 1 .  baclofen (LIORESAL) 10 MG tablet, Take 1 tablet (10 mg total) by mouth 3 (three) times daily., Disp: 270 tablet, Rfl: 1 .  citalopram (CELEXA) 40 MG tablet, Take 1 tablet (40 mg total) by mouth daily. (Needs to be seen before next refill), Disp: 90 tablet, Rfl: 0 .  cyanocobalamin (,VITAMIN B-12,) 1000 MCG/ML injection, Inject 1 mL (1,000 mcg total) into the muscle  every 30 (thirty) days., Disp: 3 mL, Rfl: 3 .  diclofenac (VOLTAREN) 75 MG EC tablet, TAKE 1 TABLET TWICE A DAY, Disp: 180 tablet, Rfl: 0 .  diclofenac sodium (VOLTAREN) 1 % GEL, Apply 4 g topically 4 (four) times daily. As needed for neck and back pain, Disp: 100 g, Rfl: 1 .  dicyclomine (BENTYL) 10 MG capsule, TAKE ONE CAPSULE THREE TIMES A DAY AS NEEDED FOR ABDOMINAL CRAMPING AND DIARRHEA, Disp: 270 capsule, Rfl: 1 .  empagliflozin (JARDIANCE) 10 MG TABS tablet, Take 10 mg by mouth daily. To control diabetes, Disp: 30 tablet, Rfl: 2 .  ezetimibe (ZETIA) 10 MG tablet, Take 1 tablet (10 mg total) by mouth daily., Disp: 90 tablet, Rfl: 1 .  fenofibrate 160 MG tablet, Take 1 tablet (160 mg total) by mouth daily. For cholesterol and triglyceride, Disp: 90 tablet, Rfl: 1 .  glucose blood test strip, Use as instructed, Disp: 200 each, Rfl: 3 .  hydrOXYzine (VISTARIL) 50 MG capsule, Take one in am for anxiety and two at bed time., Disp: 270 capsule, Rfl: 0 .  lamoTRIgine (LAMICTAL) 100 MG tablet, Take 1 tablet (100 mg total) by mouth daily., Disp: 90 tablet, Rfl: 0 .  Lancets MISC, Use to check blood sugars twice daily, Disp: 200 each, Rfl: 3 .  lidocaine (LIDODERM) 5 %, PLACE 1 PATCH ONTO THE SKIN DAILY. REMOVE & DISCARD PATCH WITHIN 12 HOURS OR AS DIRECTED BY MD, Disp: 30 patch, Rfl: 5 .  NARCAN 4 MG/0.1ML LIQD nasal spray kit, USE 1 (ONE) SPRAY AS NEEDED FOR ACCIDENTAL OVERDOSE, Disp: , Rfl: 0 .  omeprazole (PRILOSEC) 20 MG capsule, Take 1 capsule (20 mg total) by mouth daily., Disp: 90 capsule, Rfl: 1   Social History: Reviewed -  reports that she has been smoking cigarettes. She has a 5.75 pack-year smoking history. She has never used smokeless tobacco.  Objective Findings:  Vitals: There were no vitals taken for this visit.  PHYSICAL EXAMINATION General appearance - alert, well appearing, and in no distress, oriented to person, place, and time, normal appearing weight and well hydrated Mental  status - alert, oriented to person, place, and time, normal mood, behavior, speech, dress, motor activity, and thought processes, affect appropriate to mood Chest - not examined Heart - not examined Abdomen - not examined Breasts - not examined Skin - normal coloration and turgor, no rashes, no suspicious skin lesions noted  PELVIC External genitalia - Left labia majora, just lateral to the posterior Fourchette. One ust above the clitorous 3 cm to the left of the clitorous hood and 1.5 cm of the left  inguinal crease that contains some purulence that expresses,  eveh though pt is on ABX.[place] - old scarring from prior infections several sites on vulva Vagina - normal  Cervix - surgically absent Uterus - surgically absent} Adnexa - not examined [place] - n/a Rectal - rectal exam not indicated    Assessment & Plan:   A:  1.  Persistent pelvic pain from chronic vulvar infections ( hidradenitis suppurativa.  P:  1.  Schedule removal for excisional surgery for vulvar  Chronic sebaceous chst.l removal; Pt would prefer a Thursday or Friday appointment for surgery.   as  By signing my name below, I, [employer], attest that this documentation has been prepared under the direction and in the presence of Jonnie Kind, MD. Electronically Signed: [place]. 01/01/20. 9:16 AM.  I personally performed the services described in this documentation, which was SCRIBED in my presence. The recorded information has been reviewed and considered accurate. It has been edited as necessary during review. Jonnie Kind, MD

## 2020-01-02 ENCOUNTER — Ambulatory Visit (HOSPITAL_COMMUNITY): Payer: BC Managed Care – PPO | Admitting: Psychology

## 2020-01-04 ENCOUNTER — Other Ambulatory Visit: Payer: Self-pay | Admitting: Family Medicine

## 2020-01-04 DIAGNOSIS — E1142 Type 2 diabetes mellitus with diabetic polyneuropathy: Secondary | ICD-10-CM

## 2020-01-04 DIAGNOSIS — E1165 Type 2 diabetes mellitus with hyperglycemia: Secondary | ICD-10-CM

## 2020-01-05 ENCOUNTER — Other Ambulatory Visit: Payer: Self-pay | Admitting: Family Medicine

## 2020-01-15 ENCOUNTER — Ambulatory Visit (INDEPENDENT_AMBULATORY_CARE_PROVIDER_SITE_OTHER): Payer: BC Managed Care – PPO | Admitting: Obstetrics and Gynecology

## 2020-01-15 ENCOUNTER — Encounter: Payer: Self-pay | Admitting: Obstetrics and Gynecology

## 2020-01-15 ENCOUNTER — Other Ambulatory Visit: Payer: Self-pay

## 2020-01-15 ENCOUNTER — Other Ambulatory Visit: Payer: Self-pay | Admitting: Obstetrics and Gynecology

## 2020-01-15 VITALS — BP 115/77 | HR 90 | Ht 68.0 in | Wt 198.2 lb

## 2020-01-15 DIAGNOSIS — N907 Vulvar cyst: Secondary | ICD-10-CM | POA: Diagnosis not present

## 2020-01-15 DIAGNOSIS — L732 Hidradenitis suppurativa: Secondary | ICD-10-CM

## 2020-01-15 IMAGING — DX DG LUMBAR SPINE 2-3V
2 series · 2 of 2 positions shown · non-contrast
Comparison: None.

CLINICAL DATA: 47-year-old female low back pain. Initial encounter.

EXAM:
LUMBAR SPINE - 2-3 VIEW

[l-spine ap]
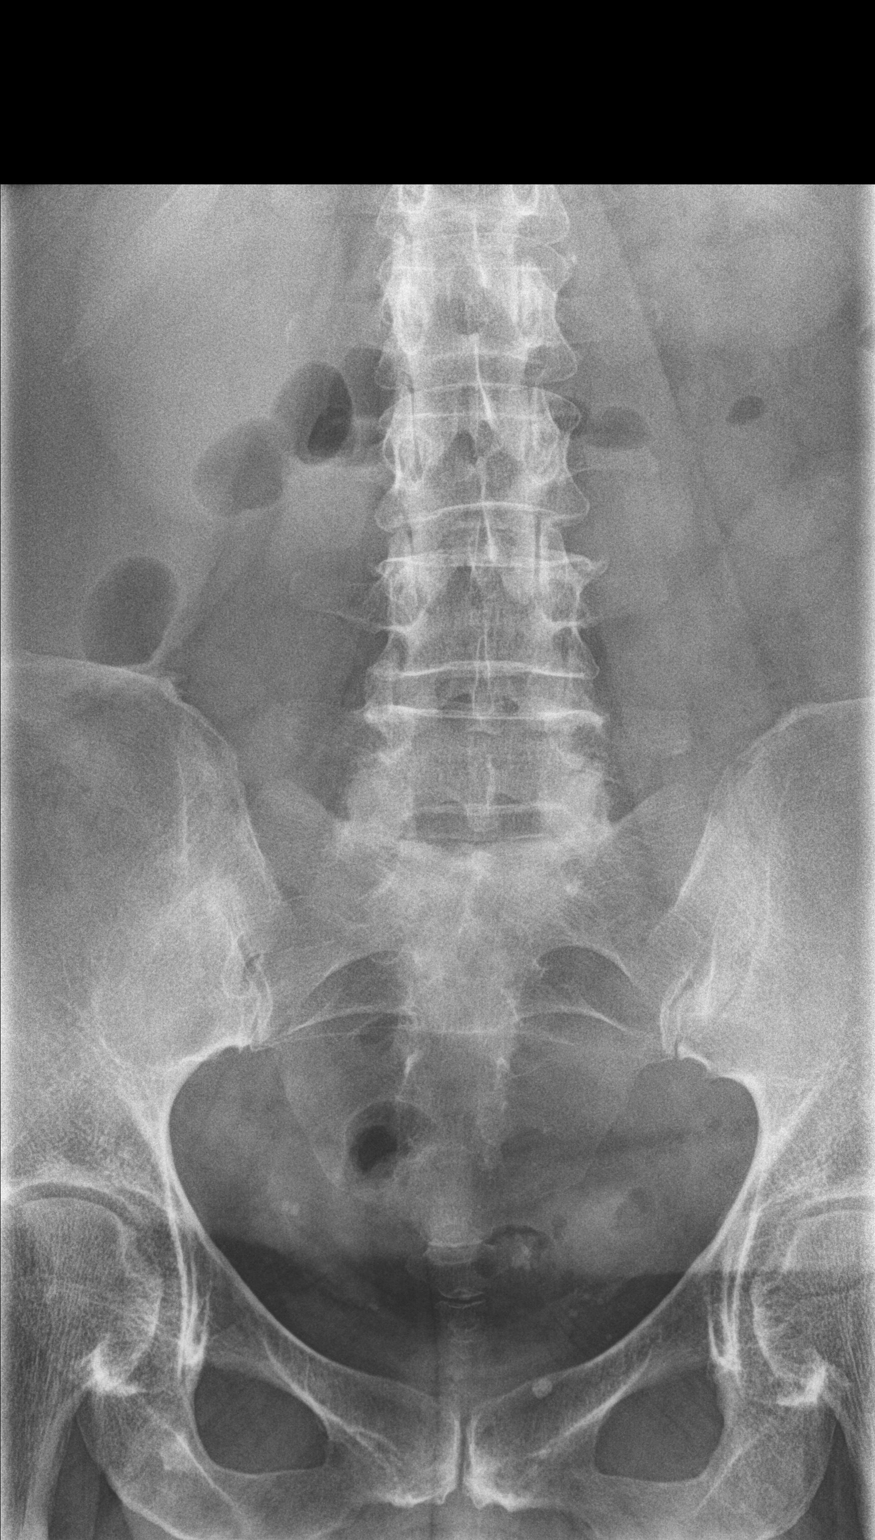

[l-spine lat]
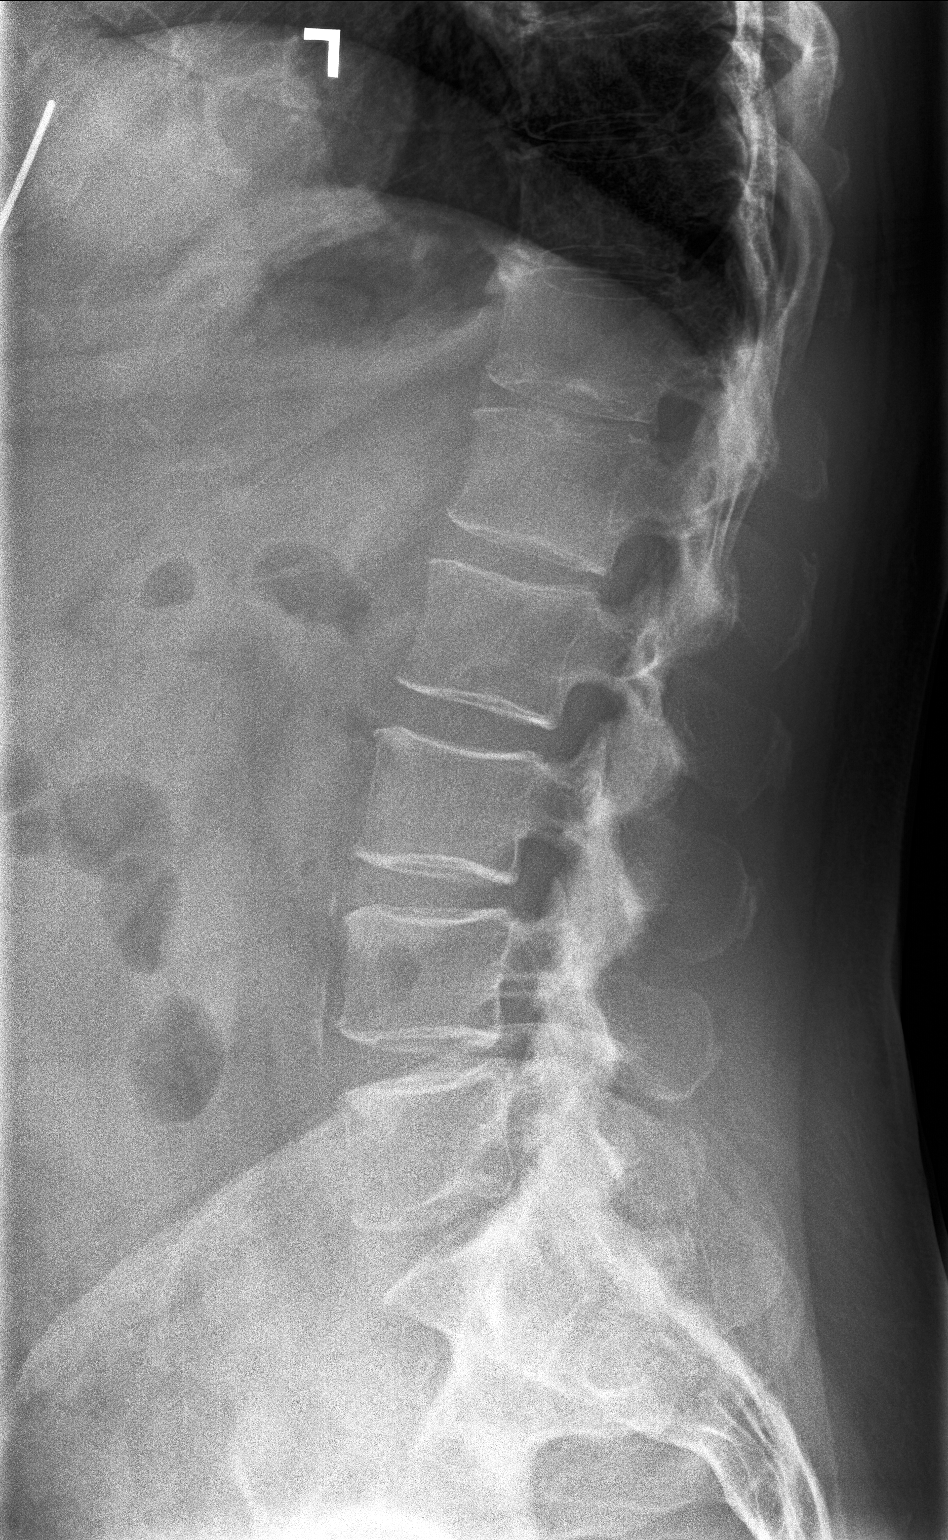

[2 of 2 positions shown; findings below may reference images not displayed]

FINDINGS: Normal alignment lumbar spine. No lumbar disc space narrowing.
Minimal Schmorl's node deformity inferior endplate L5. Minimal facet
degenerative changes lower lumbar spine. Possible minimal Schmorl's
node deformity T12-L1. No compression fracture or obvious pars
defect. Sacroiliac joints appear intact. Minimal degenerative
changes pubic symphysis. Aortic calcification.
IMPRESSION: No lumbar disc space narrowing.

Minimal facet degenerative changes lower lumbar spine.

Aortic Atherosclerosis ([id number]).

## 2020-01-15 MED ORDER — AMOXICILLIN-POT CLAVULANATE 500-125 MG PO TABS: 500.0000 mg | ORAL_TABLET | Freq: Three times a day (TID) | ORAL | 0 refills | Status: DC

## 2020-01-15 MED ORDER — FLUCONAZOLE 150 MG PO TABS: 150.0000 mg | ORAL_TABLET | ORAL | 1 refills | Status: DC

## 2020-01-15 NOTE — Progress Notes (Signed)
Preoperative History and Physical  Aimee [name] is a 49 y.o. No obstetric history on file. here for surgical management of recurrent vulvar sebaceous cysts She has a history of hidradenitis .   No significant preoperative concerns the areas are 3, all on the left labia majora. All 3 areas are minimally inflamed at this time. Will place pt on antibiotics 4 days before surgery as precaustion  Proposed surgery: Excision of vulvar sebaceous cysts  Past Medical History:  Diagnosis Date  . Arthritis   . Atypical mole 10/31/2012   R neck-mod. ([facility])  . Depression   . Diabetes mellitus    type II   . Family history of adverse reaction to anesthesia    mother- mother has lots of alleriges   . GERD (gastroesophageal reflux disease)   . Hidradenitis suppurativa   . Hidradenitis suppurativa   . Hypertension   . Hypertriglyceridemia   . Neuromuscular disorder ([place])    diabetic neuropathy   . PONV (postoperative nausea and vomiting)   . PTSD (post-traumatic stress disorder)   . PTSD (post-traumatic stress disorder)    Past Surgical History:  Procedure Laterality Date  . ABDOMINAL HYSTERECTOMY  2010  . ABDOMINOPLASTY  2006  . BREAST EXCISIONAL BIOPSY Right   . [place]   x2  . CHOLECYSTECTOMY N/A 01/11/2018   Procedure: LAPAROSCOPIC CHOLECYSTECTOMY;  Surgeon: [name], MD;  Location: [facility];  Service: General;  Laterality: N/A;  PT HAS PTSD AND IS AGGREGATED BY WAITING FOR LONG PERIODS OF TIME.  [facility]GERY     12 surgeries on R ear starting in 1984; implant 06/2011  . left  carpal tunnel surgery     . LIVER BIOPSY N/A 01/11/2018   Procedure: LIVER BIOPSY;  Surgeon: [name], MD;  Location: [facility];  Service: General;  Laterality: N/A;  . SHOULDER ARTHROSCOPY Left 10/2015  . THORACIC [place] SURGERY  2011, 2012   x2  . TONSILLECTOMY  12/26/2017  . TUBAL LIGATION     OB History  No obstetric history on file.  Patient denies any other pertinent gynecologic  issues.   Current Outpatient Medications on File Prior to Visit  Medication Sig Dispense Refill  . amLODipine (NORVASC) 5 MG tablet Take 1 tablet (5 mg total) by mouth daily. 90 tablet 0  . aspirin 81 MG EC tablet TAKE 1 TABLET BY MOUTH EVERY DAY 90 tablet 1  . atorvastatin (LIPITOR) 40 MG tablet Take 1 tablet (40 mg total) by mouth daily. 90 tablet 1  . Biotin 10000 MCG TABS Take 10,000 mcg by mouth daily.    . Calcium Carb-Cholecalciferol (CALCIUM 600 + D PO) Take 1 tablet by mouth daily.    . citalopram (CELEXA) 40 MG tablet Take 1 tablet (40 mg total) by mouth daily. (Needs to be seen before next refill) 90 tablet 0  . cyanocobalamin (,VITAMIN B-12,) 1000 MCG/ML injection Inject 1 mL (1,000 mcg total) into the muscle every 30 (thirty) days. 3 mL 3  . diclofenac (VOLTAREN) 75 MG EC tablet TAKE 1 TABLET TWICE A DAY (Patient taking differently: Take 75 mg by mouth 2 (two) times daily. ) 180 tablet 0  . dicyclomine (BENTYL) 10 MG capsule TAKE ONE CAPSULE THREE TIMES A DAY AS NEEDED FOR ABDOMINAL CRAMPING AND DIARRHEA (Patient taking differently: Take 10 mg by mouth 3 (three) times daily as needed for spasms (and diarrhea). ) 270 capsule 1  . ezetimibe (ZETIA) 10 MG tablet Take 1 tablet (  10 mg total) by mouth daily. 90 tablet 1  . fenofibrate 160 MG tablet Take 1 tablet (160 mg total) by mouth daily. For cholesterol and triglyceride 90 tablet 1  . glucose blood test strip Use as instructed 200 each 3  . hydrOXYzine (VISTARIL) 50 MG capsule Take one in am for anxiety and two at bed time. (Patient taking differently: Take 50-100 mg by mouth See admin instructions. Take 50 mg by mouth in the morning and 100 mg at bedtime) 270 capsule 0  . JARDIANCE 10 MG TABS tablet TAKE 1 TABLET BY MOUTH ONCE DAILY TO CONTROL DIABETES (Patient taking differently: Take 10 mg by mouth daily. ) 30 tablet 0  . lamoTRIgine (LAMICTAL) 100 MG tablet Take 1 tablet (100 mg total) by mouth daily. 90 tablet 0  . Lancets MISC  Use to check blood sugars twice daily 200 each 3  . lidocaine (LIDODERM) 5 % PLACE 1 PATCH ONTO THE SKIN DAILY. REMOVE & DISCARD PATCH WITHIN 12 HOURS OR AS DIRECTED BY MD (Patient taking differently: Place 1 patch onto the skin daily as needed (pain). ) 30 patch 5  . NARCAN 4 MG/0.1ML LIQD nasal spray kit Place 0.4 mg into the nose once.   0  . Omega-3 Fatty Acids (FISH OIL) 1200 MG CAPS Take 1,200 mg by mouth in the morning and at bedtime.    [place] omeprazole (PRILOSEC) 20 MG capsule Take 1 capsule (20 mg total) by mouth daily. 90 capsule 1  . amoxicillin-clavulanate (AUGMENTIN) 500-125 MG tablet Take 1 tablet (500 mg total) by mouth 3 (three) times daily. (Patient not taking: Reported on 01/07/2020) 30 tablet 0  . baclofen (LIORESAL) 10 MG tablet Take 1 tablet (10 mg total) by mouth 3 (three) times daily. 270 tablet 1  . diclofenac sodium (VOLTAREN) 1 % GEL Apply 4 g topically 4 (four) times daily. As needed for neck and back pain 100 g 1   No current facility-administered medications on file prior to visit.   Allergies  Allergen Reactions  . Hydrocodone Itching and Other (See Comments)    And family of drugs   . Oxycodone Itching  . Tramadol Itching  . Mirtazapine Other (See Comments)    xs hunger, foot tapping idiosynchronously  . Percocet [Oxycodone-Acetaminophen]   . Darvocet [Propoxyphene N-Acetaminophen] Itching    Social History:   reports that she has been smoking cigarettes. She has a 5.75 pack-year smoking history. She has never used smokeless tobacco. She reports that she does not drink alcohol or use drugs.  Family History  Problem Relation Age of Onset  . GI problems Mother   . Mental illness Mother   . Bipolar disorder Mother   . Diabetes Father   . Breast cancer Maternal Aunt        in 59's  . Liver cancer Maternal Aunt   . Bipolar disorder Maternal Aunt   . Schizophrenia Maternal Aunt   . Personality disorder Maternal Aunt   . Colon cancer Maternal Grandmother   .  Heart attack Maternal Grandfather   . Heart attack Paternal Grandmother   . Heart attack Paternal Grandfather   . Heart attack Paternal Uncle   . Lymphoma Paternal Uncle   . Bone cancer Cousin     Review of Systems: Noncontributory  PHYSICAL EXAM: Blood pressure 115/77, pulse 90, height 5' 8"  (1.727 m), weight 198 lb 3.2 oz (89.9 kg). General appearance - alert, well appearing, and in no distress Chest - clear to auscultation, no  wheezes, rales or rhonchi, symmetric air entry Heart - normal rate and regular rhythm Abdomen - soft, nontender, nondistended, no masses or organomegaly                     Well healed s/p abdominoplasty  Pelvic - examination 3 areas on left labia majora with chronic scarring over a deep nodule 1- 2 cm in maximud diameter, lowest is just left of posterior fouchette, middle lesions is even with intoritus, 3 oclock, and upper lesion is on left mons pubis. Extremities - peripheral pulses normal, no pedal edema, no clubbing or cyanosis  Labs:  Imaging Studies: MM 3D SCREEN BREAST BILATERAL  Result Date: 12/27/2019 CLINICAL DATA:  Screening. EXAM: DIGITAL SCREENING BILATERAL MAMMOGRAM WITH TOMO AND CAD COMPARISON:  Previous exam(s). ACR Breast Density Category a: The breast tissue is almost entirely fatty. FINDINGS: There are no findings suspicious for malignancy. Images were processed with CAD. IMPRESSION: No mammographic evidence of malignancy. A result letter of this screening mammogram will be mailed directly to the patient. RECOMMENDATION: Screening mammogram in one year. (Code:SM-B-01Y) BI-RADS CATEGORY  1: Negative. Electronically Signed   By: Ammie Ferrier M.D.   On: 12/27/2019 09:23    Assessment: Patient Active Problem List   Diagnosis Date Noted  . Hidradenitis suppurativa   . Unilateral primary osteoarthritis, left hip 10/22/2019  . Hx of fusion of cervical spine 02/27/2019  . Tendinopathy of rotator cuff, left 02/27/2019  . Aortic  atherosclerosis ([place]) 05/22/2018  . Ganglion cyst of finger 09/13/2017  . Status post placement of bone anchored hearing aid (BAHA) 08/14/2017  . Fibromyalgia 11/29/2016  . Lumbar radiculopathy 11/21/2016  . Essential hypertension 04/22/2016  . Severe obesity (BMI >= 40) ([place]) 10/08/2015  . PTSD (post-traumatic stress disorder) 09/10/2015  . Trapezius strain 08/19/2015  . Depression 08/19/2015  . Hypertriglyceridemia 11/12/2014  . Peripheral neuropathy 11/12/2014  . DDD (degenerative disc disease), lumbar 11/12/2014  . Varicose veins of leg with pain, bilateral 05/12/2014  . Diabetes ([place]) 03/05/2013  . Smoking 03/05/2013    Plan: Patient will undergo surgical management with wide excision of vulvar sebaceous cysts.Jonnie Kind, MD  .mec 01/15/2020 10:34 AM\

## 2020-01-15 NOTE — Progress Notes (Signed)
PATIENT ID: Aimee [name], female     DOB: Jan 13, 1971, 49 y.o.     MRN: [id number]    [facility] Visit  01/15/20          Patient name: Aimee [name] MRN [id number]  Date of birth: [birth year]  CC & HPI:  Aimee [name] is a 49 y.o. female presenting today for examination prior to excision of three labial vulvar sebaceous cyst.  She does note that she is somewhat difficult to keep under anesthesia.   ROS:  Review of Systems  Constitutional: Negative for diaphoresis, fever, malaise/fatigue and weight loss.  HENT: Negative for congestion and sore throat.   Eyes: Negative for blurred vision and double vision.  Respiratory: Negative for cough and shortness of breath.   Cardiovascular: Negative for chest pain, palpitations and leg swelling.  Gastrointestinal: Negative for constipation, diarrhea, nausea and vomiting.  Genitourinary: Negative for frequency and urgency.  Musculoskeletal: Negative for back pain, falls and myalgias.  Skin: Negative for rash.  Neurological: Negative for dizziness, weakness and headaches.  Psychiatric/Behavioral: Negative for depression. The patient is not nervous/anxious.      Pertinent History Reviewed:   Reviewed: Significant for  Medical         Past Medical History:  Diagnosis Date  . Arthritis   . Atypical mole 10/31/2012   R neck-mod. ([facility])  . Depression   . Diabetes mellitus    type II   . Family history of adverse reaction to anesthesia    mother- mother has lots of alleriges   . GERD (gastroesophageal reflux disease)   . Hidradenitis suppurativa   . Hidradenitis suppurativa   . Hypertension   . Hypertriglyceridemia   . Neuromuscular disorder ([place])    diabetic neuropathy   . PONV (postoperative nausea and vomiting)   . PTSD (post-traumatic stress disorder)   . PTSD (post-traumatic stress disorder)                               Surgical Hx:    Past Surgical History:  Procedure Laterality Date  . ABDOMINAL HYSTERECTOMY  2010   . ABDOMINOPLASTY  2006  . BREAST EXCISIONAL BIOPSY Right   . [place]   x2  . CHOLECYSTECTOMY N/A 01/11/2018   Procedure: LAPAROSCOPIC CHOLECYSTECTOMY;  Surgeon: [name], MD;  Location: [facility];  Service: General;  Laterality: N/A;  PT HAS PTSD AND IS AGGREGATED BY WAITING FOR LONG PERIODS OF TIME.  Aimee [name] EAR SURGERY     12 surgeries on R ear starting in 1984; implant 06/2011  . left  carpal tunnel surgery     . LIVER BIOPSY N/A 01/11/2018   Procedure: LIVER BIOPSY;  Surgeon: [name], MD;  Location: [facility];  Service: General;  Laterality: N/A;  . SHOULDER ARTHROSCOPY Left 10/2015  . THORACIC [place] SURGERY  2011, 2012   x2  . TONSILLECTOMY  12/26/2017  . TUBAL LIGATION     Medications: Reviewed & Updated - see associated section                       Current Outpatient Medications:  .  amLODipine (NORVASC) 5 MG tablet, Take 1 tablet (5 mg total) by mouth daily., Disp: 90 tablet, Rfl: 0 .  aspirin 81 MG EC tablet, TAKE 1 TABLET BY MOUTH EVERY DAY, Disp: 90 tablet, Rfl: 1 .  atorvastatin (LIPITOR) 40 MG tablet, Take  1 tablet (40 mg total) by mouth daily., Disp: 90 tablet, Rfl: 1 .  Biotin 10000 MCG TABS, Take 10,000 mcg by mouth daily., Disp: , Rfl:  .  Calcium Carb-Cholecalciferol (CALCIUM 600 + D PO), Take 1 tablet by mouth daily., Disp: , Rfl:  .  citalopram (CELEXA) 40 MG tablet, Take 1 tablet (40 mg total) by mouth daily. (Needs to be seen before next refill), Disp: 90 tablet, Rfl: 0 .  cyanocobalamin (,VITAMIN B-12,) 1000 MCG/ML injection, Inject 1 mL (1,000 mcg total) into the muscle every 30 (thirty) days., Disp: 3 mL, Rfl: 3 .  diclofenac (VOLTAREN) 75 MG EC tablet, TAKE 1 TABLET TWICE A DAY (Patient taking differently: Take 75 mg by mouth 2 (two) times daily. ), Disp: 180 tablet, Rfl: 0 .  dicyclomine (BENTYL) 10 MG capsule, TAKE ONE CAPSULE THREE TIMES A DAY AS NEEDED FOR ABDOMINAL CRAMPING AND DIARRHEA (Patient taking differently: Take 10 mg by mouth 3  (three) times daily as needed for spasms (and diarrhea). ), Disp: 270 capsule, Rfl: 1 .  ezetimibe (ZETIA) 10 MG tablet, Take 1 tablet (10 mg total) by mouth daily., Disp: 90 tablet, Rfl: 1 .  fenofibrate 160 MG tablet, Take 1 tablet (160 mg total) by mouth daily. For cholesterol and triglyceride, Disp: 90 tablet, Rfl: 1 .  glucose blood test strip, Use as instructed, Disp: 200 each, Rfl: 3 .  hydrOXYzine (VISTARIL) 50 MG capsule, Take one in am for anxiety and two at bed time. (Patient taking differently: Take 50-100 mg by mouth See admin instructions. Take 50 mg by mouth in the morning and 100 mg at bedtime), Disp: 270 capsule, Rfl: 0 .  JARDIANCE 10 MG TABS tablet, TAKE 1 TABLET BY MOUTH ONCE DAILY TO CONTROL DIABETES (Patient taking differently: Take 10 mg by mouth daily. ), Disp: 30 tablet, Rfl: 0 .  lamoTRIgine (LAMICTAL) 100 MG tablet, Take 1 tablet (100 mg total) by mouth daily., Disp: 90 tablet, Rfl: 0 .  Lancets MISC, Use to check blood sugars twice daily, Disp: 200 each, Rfl: 3 .  lidocaine (LIDODERM) 5 %, PLACE 1 PATCH ONTO THE SKIN DAILY. REMOVE & DISCARD PATCH WITHIN 12 HOURS OR AS DIRECTED BY MD (Patient taking differently: Place 1 patch onto the skin daily as needed (pain). ), Disp: 30 patch, Rfl: 5 .  NARCAN 4 MG/0.1ML LIQD nasal spray kit, Place 0.4 mg into the nose once. , Disp: , Rfl: 0 .  Omega-3 Fatty Acids (FISH OIL) 1200 MG CAPS, Take 1,200 mg by mouth in the morning and at bedtime., Disp: , Rfl:  .  omeprazole (PRILOSEC) 20 MG capsule, Take 1 capsule (20 mg total) by mouth daily., Disp: 90 capsule, Rfl: 1 .  amoxicillin-clavulanate (AUGMENTIN) 500-125 MG tablet, Take 1 tablet (500 mg total) by mouth 3 (three) times daily. (Patient not taking: Reported on 01/07/2020), Disp: 30 tablet, Rfl: 0 .  baclofen (LIORESAL) 10 MG tablet, Take 1 tablet (10 mg total) by mouth 3 (three) times daily., Disp: 270 tablet, Rfl: 1 .  diclofenac sodium (VOLTAREN) 1 % GEL, Apply 4 g topically 4  (four) times daily. As needed for neck and back pain, Disp: 100 g, Rfl: 1   Social History: Reviewed -  reports that she has been smoking cigarettes. She has a 5.75 pack-year smoking history. She has never used smokeless tobacco.  Objective Findings:  Vitals: Blood pressure 115/77, pulse 90, height 5' 8"  (1.727 m), weight 198 lb 3.2 oz (89.9 kg).  PHYSICAL  EXAMINATION General appearance - alert, well appearing, and in no distress, oriented to person, place, and time and normal appearing weight Mental status - alert, oriented to person, place, and time, normal mood, behavior, speech, dress, motor activity, and thought processes, affect appropriate to mood Chest - not examined Heart - not examined Abdomen - not examined Breasts - not examined Skin - normal coloration and turgor, no rashes, no suspicious skin lesions noted  PELVIC External genitalia - Left labia majora 2 cm to the left of the posteria vaginal entrance. Second on the left mons pubis.  Vulva - 3 sites Vagina - normal secretions Cervix -  Uterus -  Adnexa -  Wet [place] -  Rectal - rectal exam not indicated    Assessment & Plan:   A:  1.  Vulvar sebaceous cysts  P:  1. Rx antibiotic Augmentin post surgery 2. Rx diflucan for anticipated yeast infection    By signing my name below, I, [employer], attest that this documentation has been prepared under the direction and in the presence of Jonnie Kind, MD. Electronically Signed: [place]. 01/15/20. 10:19 AM.  I personally performed the services described in this documentation, which was SCRIBED in my presence. The recorded information has been reviewed and considered accurate. It has been edited as necessary during review. Jonnie Kind, MD

## 2020-01-16 DIAGNOSIS — M542 Cervicalgia: Secondary | ICD-10-CM | POA: Diagnosis not present

## 2020-01-16 DIAGNOSIS — M47812 Spondylosis without myelopathy or radiculopathy, cervical region: Secondary | ICD-10-CM | POA: Diagnosis not present

## 2020-01-20 ENCOUNTER — Encounter (HOSPITAL_COMMUNITY): Payer: Self-pay | Admitting: Psychology

## 2020-01-20 NOTE — Patient Instructions (Addendum)
Your procedure is scheduled on: 01/23/2020  Report to [facility] at  6:15   AM.  Call this number if you have problems the morning of surgery: [phone]   Remember:   Do not Eat or Drink after midnight         No Smoking the morning of surgery  :  Take these medicines the morning of surgery with A SIP OF WATER: Amlodipine and omeprazole No diabetic medication am of surgery   Do not wear jewelry, make-up or nail polish.  Do not wear lotions, powders, or perfumes. You may wear deodorant.  Do not shave 48 hours prior to surgery. Men may shave face and neck.  Do not bring valuables to the hospital.  Contacts, dentures or bridgework may not be worn into surgery.  Leave suitcase in the car. After surgery it may be brought to your room.  For patients admitted to the hospital, checkout time is 11:00 AM the day of discharge.   Patients discharged the day of surgery will not be allowed to drive home.    Special Instructions: Shower using CHG night before surgery and shower the day of surgery use CHG.  Use special wash - you have one bottle of CHG for all showers.  You should use approximately 1/2 of the bottle for each shower.  Wound Care, Adult Taking care of your wound properly can help to prevent pain, infection, and scarring. It can also help your wound to heal more quickly. How to care for your wound Wound care      Follow instructions from your health care provider about how to take care of your wound. Make sure you: ? Wash your hands with soap and water before you change the bandage (dressing). If soap and water are not available, use hand sanitizer. ? Change your dressing as told by your health care provider. ? Leave stitches (sutures), skin glue, or adhesive strips in place. These skin closures may need to stay in place for 2 weeks or longer. If adhesive strip edges start to loosen and curl up, you may trim the loose edges. Do not remove adhesive strips completely unless your  health care provider tells you to do that.  Check your wound area every day for signs of infection. Check for: ? Redness, swelling, or pain. ? Fluid or blood. ? Warmth. ? Pus or a bad smell.  Ask your health care provider if you should clean the wound with mild soap and water. Doing this may include: ? Using a clean towel to pat the wound dry after cleaning it. Do not rub or scrub the wound. ? Applying a cream or ointment. Do this only as told by your health care provider. ? Covering the incision with a clean dressing.  Ask your health care provider when you can leave the wound uncovered.  Keep the dressing dry until your health care provider says it can be removed. Do not take baths, swim, use a hot tub, or do anything that would put the wound underwater until your health care provider approves. Ask your health care provider if you can take showers. You may only be allowed to take sponge baths. Medicines   If you were prescribed an antibiotic medicine, cream, or ointment, take or use the antibiotic as told by your health care provider. Do not stop taking or using the antibiotic even if your condition improves.  Take over-the-counter and prescription medicines only as told by your health care provider. If you  were prescribed pain medicine, take it 30 or more minutes before you do any wound care or as told by your health care provider. General instructions  Return to your normal activities as told by your health care provider. Ask your health care provider what activities are safe.  Do not scratch or pick at the wound.  Do not use any products that contain nicotine or tobacco, such as cigarettes and e-cigarettes. These may delay wound healing. If you need help quitting, ask your health care provider.  Keep all follow-up visits as told by your health care provider. This is important.  Eat a diet that includes protein, vitamin A, vitamin C, and other nutrient-rich foods to help the wound  heal. ? Foods rich in protein include meat, dairy, beans, nuts, and other sources. ? Foods rich in vitamin A include carrots and dark green, leafy vegetables. ? Foods rich in vitamin C include citrus, tomatoes, and other fruits and vegetables. ? Nutrient-rich foods have protein, carbohydrates, fat, vitamins, or minerals. Eat a variety of healthy foods including vegetables, fruits, and whole grains. Contact a health care provider if:  You received a tetanus shot and you have swelling, severe pain, redness, or bleeding at the injection site.  Your pain is not controlled with medicine.  You have redness, swelling, or pain around the wound.  You have fluid or blood coming from the wound.  Your wound feels warm to the touch.  You have pus or a bad smell coming from the wound.  You have a fever or chills.  You are nauseous or you vomit.  You are dizzy. Get help right away if:  You have a red streak going away from your wound.  The edges of the wound open up and separate.  Your wound is bleeding, and the bleeding does not stop with gentle pressure.  You have a rash.  You faint.  You have trouble breathing. Summary  Always wash your hands with soap and water before changing your bandage (dressing).  To help with healing, eat foods that are rich in protein, vitamin A, vitamin C, and other nutrients.  Check your wound every day for signs of infection. Contact your health care provider if you suspect that your wound is infected. This information is not intended to replace advice given to you by your health care provider. Make sure you discuss any questions you have with your health care provider. Document Revised: 12/17/2018 Document Reviewed: 03/15/2016 Elsevier Patient Education  [place] Anesthesia, Adult, Care After This sheet gives you information about how to care for yourself after your procedure. Your health care provider may also give you more  specific instructions. If you have problems or questions, contact your health care provider. What can I expect after the procedure? After the procedure, the following side effects are common:  Pain or discomfort at the IV site.  Nausea.  Vomiting.  Sore throat.  Trouble concentrating.  Feeling cold or chills.  Weak or tired.  Sleepiness and fatigue.  Soreness and body aches. These side effects can affect parts of the body that were not involved in surgery. Follow these instructions at home:  For at least 24 hours after the procedure:  Have a responsible adult stay with you. It is important to have someone help care for you until you are awake and alert.  Rest as needed.  Do not: ? Participate in activities in which you could fall or become injured. ? Drive. ? Use  heavy machinery. ? Drink alcohol. ? Take sleeping pills or medicines that cause drowsiness. ? Make important decisions or sign legal documents. ? Take care of children on your own. Eating and drinking  Follow any instructions from your health care provider about eating or drinking restrictions.  When you feel hungry, start by eating small amounts of foods that are soft and easy to digest (bland), such as toast. Gradually return to your regular diet.  Drink enough fluid to keep your urine pale yellow.  If you vomit, rehydrate by drinking water, juice, or clear broth. General instructions  If you have sleep apnea, surgery and certain medicines can increase your risk for breathing problems. Follow instructions from your health care provider about wearing your sleep device: ? Anytime you are sleeping, including during daytime naps. ? While taking prescription pain medicines, sleeping medicines, or medicines that make you drowsy.  Return to your normal activities as told by your health care provider. Ask your health care provider what activities are safe for you.  Take over-the-counter and prescription  medicines only as told by your health care provider.  If you smoke, do not smoke without supervision.  Keep all follow-up visits as told by your health care provider. This is important. Contact a health care provider if:  You have nausea or vomiting that does not get better with medicine.  You cannot eat or drink without vomiting.  You have pain that does not get better with medicine.  You are unable to pass urine.  You develop a skin rash.  You have a fever.  You have redness around your IV site that gets worse. Get help right away if:  You have difficulty breathing.  You have chest pain.  You have blood in your urine or stool, or you vomit blood. Summary  After the procedure, it is common to have a sore throat or nausea. It is also common to feel tired.  Have a responsible adult stay with you for the first 24 hours after general anesthesia. It is important to have someone help care for you until you are awake and alert.  When you feel hungry, start by eating small amounts of foods that are soft and easy to digest (bland), such as toast. Gradually return to your regular diet.  Drink enough fluid to keep your urine pale yellow.  Return to your normal activities as told by your health care provider. Ask your health care provider what activities are safe for you. This information is not intended to replace advice given to you by your health care provider. Make sure you discuss any questions you have with your health care provider. Document Revised: 09/01/2017 Document Reviewed: 04/14/2017 Elsevier Patient Education  [place].

## 2020-01-21 ENCOUNTER — Encounter: Payer: Self-pay | Admitting: *Deleted

## 2020-01-21 ENCOUNTER — Other Ambulatory Visit: Payer: Self-pay | Admitting: *Deleted

## 2020-01-21 ENCOUNTER — Other Ambulatory Visit (HOSPITAL_COMMUNITY)
Admission: RE | Admit: 2020-01-21 | Discharge: 2020-01-21 | Disposition: A | Discharge status: Home / Self Care | Payer: BC Managed Care – PPO | Source: Ambulatory Visit | Attending: Obstetrics and Gynecology | Admitting: Obstetrics and Gynecology

## 2020-01-21 ENCOUNTER — Other Ambulatory Visit: Payer: Self-pay

## 2020-01-21 ENCOUNTER — Encounter (HOSPITAL_COMMUNITY): Payer: Self-pay

## 2020-01-21 ENCOUNTER — Encounter (HOSPITAL_COMMUNITY)
Admission: RE | Admit: 2020-01-21 | Discharge: 2020-01-21 | Disposition: A | Discharge status: Home / Self Care | Payer: BC Managed Care – PPO | Source: Ambulatory Visit | Attending: Obstetrics and Gynecology | Admitting: Obstetrics and Gynecology

## 2020-01-21 DIAGNOSIS — Z01812 Encounter for preprocedural laboratory examination: Secondary | ICD-10-CM | POA: Diagnosis not present

## 2020-01-21 DIAGNOSIS — Z20822 Contact with and (suspected) exposure to covid-19: Secondary | ICD-10-CM | POA: Insufficient documentation

## 2020-01-21 LAB — CBC
HCT: 48 % — ABNORMAL HIGH (ref 36.0–46.0)
Hemoglobin: 15.6 g/dL — ABNORMAL HIGH (ref 12.0–15.0)
MCH: 31.3 pg (ref 26.0–34.0)
MCHC: 32.5 g/dL (ref 30.0–36.0)
MCV: 96.4 fL (ref 80.0–100.0)
Platelets: 287 10*3/uL (ref 150–400)
RBC: 4.98 MIL/uL (ref 3.87–5.11)
RDW: 13.3 % (ref 11.5–15.5)
WBC: 8.8 10*3/uL (ref 4.0–10.5)
nRBC: 0 % (ref 0.0–0.2)

## 2020-01-21 LAB — URINALYSIS, ROUTINE W REFLEX MICROSCOPIC
Bilirubin Urine: NEGATIVE
Glucose, UA: >=500 mg/dL — AB
Hgb urine dipstick: NEGATIVE
Ketones, ur: NEGATIVE mg/dL
Leukocytes,Ua: NEGATIVE
Nitrite: NEGATIVE
Protein, ur: NEGATIVE mg/dL
Specific Gravity, Urine: >1.03 — ABNORMAL HIGH (ref 1.005–1.030)
pH: 5 (ref 5.0–8.0)

## 2020-01-21 LAB — HEMOGLOBIN A1C
Hgb A1c MFr Bld: 7.1 % — ABNORMAL HIGH (ref 4.8–5.6)
Mean Plasma Glucose: 157.07 mg/dL

## 2020-01-21 LAB — COMPREHENSIVE METABOLIC PANEL
ALT: 28 U/L (ref 0–44)
AST: 19 U/L (ref 15–41)
Albumin: 3.8 g/dL (ref 3.5–5.0)
Alkaline Phosphatase: 47 U/L (ref 38–126)
Anion gap: 11 (ref 5–15)
BUN: 18 mg/dL (ref 6–20)
CO2: 25 mmol/L (ref 22–32)
Calcium: 9.2 mg/dL (ref 8.9–10.3)
Chloride: 104 mmol/L (ref 98–111)
Creatinine, Ser: 0.7 mg/dL (ref 0.44–1.00)
GFR calc Af Amer: >60 mL/min (ref >60)
GFR calc non Af Amer: >60 mL/min (ref >60)
Glucose, Bld: 183 mg/dL — ABNORMAL HIGH (ref 70–99)
Potassium: 4.2 mmol/L (ref 3.5–5.1)
Sodium: 140 mmol/L (ref 135–145)
Total Bilirubin: 0.6 mg/dL (ref 0.3–1.2)
Total Protein: 7.1 g/dL (ref 6.5–8.1)

## 2020-01-21 LAB — SARS CORONAVIRUS 2 (TAT 6-24 HRS): SARS Coronavirus 2: NEGATIVE

## 2020-01-21 LAB — URINALYSIS, MICROSCOPIC (REFLEX)
Bacteria, UA: NONE SEEN
RBC / HPF: NONE SEEN RBC/hpf (ref 0–5)

## 2020-01-21 MED ORDER — AMLODIPINE BESYLATE 5 MG PO TABS: 5.0000 mg | ORAL_TABLET | Freq: Every day | ORAL | 0 refills | Status: AC

## 2020-01-21 MED ORDER — ATORVASTATIN CALCIUM 40 MG PO TABS: 40.0000 mg | ORAL_TABLET | Freq: Every day | ORAL | 0 refills | Status: AC

## 2020-01-23 ENCOUNTER — Encounter (HOSPITAL_COMMUNITY): Payer: Self-pay | Admitting: Obstetrics and Gynecology

## 2020-01-23 ENCOUNTER — Ambulatory Visit (HOSPITAL_COMMUNITY): Payer: BC Managed Care – PPO | Admitting: Anesthesiology

## 2020-01-23 ENCOUNTER — Ambulatory Visit (HOSPITAL_COMMUNITY)
Admission: RE | Admit: 2020-01-23 | Discharge: 2020-01-23 | Disposition: A | Discharge status: Home / Self Care | Payer: BC Managed Care – PPO | Attending: Obstetrics and Gynecology | Admitting: Obstetrics and Gynecology

## 2020-01-23 ENCOUNTER — Encounter (HOSPITAL_COMMUNITY)
Admission: RE | Disposition: A | Discharge status: Home / Self Care | Payer: Self-pay | Source: Home / Self Care | Attending: Obstetrics and Gynecology

## 2020-01-23 DIAGNOSIS — R102 Pelvic and perineal pain: Secondary | ICD-10-CM

## 2020-01-23 DIAGNOSIS — M5116 Intervertebral disc disorders with radiculopathy, lumbar region: Secondary | ICD-10-CM | POA: Insufficient documentation

## 2020-01-23 DIAGNOSIS — G709 Myoneural disorder, unspecified: Secondary | ICD-10-CM | POA: Insufficient documentation

## 2020-01-23 DIAGNOSIS — Z833 Family history of diabetes mellitus: Secondary | ICD-10-CM | POA: Insufficient documentation

## 2020-01-23 DIAGNOSIS — N907 Vulvar cyst: Secondary | ICD-10-CM

## 2020-01-23 DIAGNOSIS — Z818 Family history of other mental and behavioral disorders: Secondary | ICD-10-CM | POA: Insufficient documentation

## 2020-01-23 DIAGNOSIS — M1612 Unilateral primary osteoarthritis, left hip: Secondary | ICD-10-CM | POA: Insufficient documentation

## 2020-01-23 DIAGNOSIS — M1990 Unspecified osteoarthritis, unspecified site: Secondary | ICD-10-CM | POA: Insufficient documentation

## 2020-01-23 DIAGNOSIS — Z808 Family history of malignant neoplasm of other organs or systems: Secondary | ICD-10-CM | POA: Insufficient documentation

## 2020-01-23 DIAGNOSIS — Z6841 Body Mass Index (BMI) 40.0 and over, adult: Secondary | ICD-10-CM | POA: Insufficient documentation

## 2020-01-23 DIAGNOSIS — L732 Hidradenitis suppurativa: Secondary | ICD-10-CM

## 2020-01-23 DIAGNOSIS — E781 Pure hyperglyceridemia: Secondary | ICD-10-CM | POA: Insufficient documentation

## 2020-01-23 DIAGNOSIS — F17200 Nicotine dependence, unspecified, uncomplicated: Secondary | ICD-10-CM | POA: Insufficient documentation

## 2020-01-23 DIAGNOSIS — Z79899 Other long term (current) drug therapy: Secondary | ICD-10-CM | POA: Insufficient documentation

## 2020-01-23 DIAGNOSIS — Z90710 Acquired absence of both cervix and uterus: Secondary | ICD-10-CM | POA: Diagnosis not present

## 2020-01-23 DIAGNOSIS — F17210 Nicotine dependence, cigarettes, uncomplicated: Secondary | ICD-10-CM | POA: Insufficient documentation

## 2020-01-23 DIAGNOSIS — Z885 Allergy status to narcotic agent status: Secondary | ICD-10-CM | POA: Insufficient documentation

## 2020-01-23 DIAGNOSIS — Z791 Long term (current) use of non-steroidal anti-inflammatories (NSAID): Secondary | ICD-10-CM | POA: Diagnosis not present

## 2020-01-23 DIAGNOSIS — I10 Essential (primary) hypertension: Secondary | ICD-10-CM | POA: Diagnosis not present

## 2020-01-23 DIAGNOSIS — E1140 Type 2 diabetes mellitus with diabetic neuropathy, unspecified: Secondary | ICD-10-CM | POA: Insufficient documentation

## 2020-01-23 DIAGNOSIS — I700 Atherosclerosis of aorta: Secondary | ICD-10-CM | POA: Diagnosis not present

## 2020-01-23 DIAGNOSIS — L720 Epidermal cyst: Secondary | ICD-10-CM | POA: Diagnosis not present

## 2020-01-23 DIAGNOSIS — M797 Fibromyalgia: Secondary | ICD-10-CM | POA: Diagnosis not present

## 2020-01-23 DIAGNOSIS — N9489 Other specified conditions associated with female genital organs and menstrual cycle: Secondary | ICD-10-CM | POA: Diagnosis not present

## 2020-01-23 DIAGNOSIS — Z888 Allergy status to other drugs, medicaments and biological substances status: Secondary | ICD-10-CM | POA: Insufficient documentation

## 2020-01-23 DIAGNOSIS — F4310 Post-traumatic stress disorder, unspecified: Secondary | ICD-10-CM | POA: Diagnosis not present

## 2020-01-23 DIAGNOSIS — F419 Anxiety disorder, unspecified: Secondary | ICD-10-CM | POA: Diagnosis not present

## 2020-01-23 DIAGNOSIS — Z800 Family history of malignant neoplasm of digestive organs: Secondary | ICD-10-CM | POA: Insufficient documentation

## 2020-01-23 DIAGNOSIS — K219 Gastro-esophageal reflux disease without esophagitis: Secondary | ICD-10-CM | POA: Diagnosis not present

## 2020-01-23 DIAGNOSIS — Z7982 Long term (current) use of aspirin: Secondary | ICD-10-CM | POA: Diagnosis not present

## 2020-01-23 DIAGNOSIS — F329 Major depressive disorder, single episode, unspecified: Secondary | ICD-10-CM | POA: Diagnosis not present

## 2020-01-23 DIAGNOSIS — L723 Sebaceous cyst: Secondary | ICD-10-CM | POA: Diagnosis not present

## 2020-01-23 DIAGNOSIS — Z9049 Acquired absence of other specified parts of digestive tract: Secondary | ICD-10-CM | POA: Insufficient documentation

## 2020-01-23 DIAGNOSIS — Z803 Family history of malignant neoplasm of breast: Secondary | ICD-10-CM | POA: Insufficient documentation

## 2020-01-23 DIAGNOSIS — Z8249 Family history of ischemic heart disease and other diseases of the circulatory system: Secondary | ICD-10-CM | POA: Insufficient documentation

## 2020-01-23 HISTORY — PX: EXCISION VAGINAL CYST: SHX5825

## 2020-01-23 LAB — GLUCOSE, CAPILLARY
Glucose-Capillary: 218 mg/dL — ABNORMAL HIGH (ref 70–99)
Glucose-Capillary: 157 mg/dL — ABNORMAL HIGH (ref 70–99)

## 2020-01-23 SURGERY — EXCISION, CYST, VAGINA
Anesthesia: General | Site: Vulva | Laterality: Left

## 2020-01-23 MED ORDER — FENTANYL CITRATE (PF) 100 MCG/2ML IJ SOLN
INTRAMUSCULAR | Status: DC | PRN
  Administered 2020-01-23 (×2): 25 ug via INTRAVENOUS
  Administered 2020-01-23 (×2): 50 ug via INTRAVENOUS

## 2020-01-23 MED ORDER — BUPIVACAINE-EPINEPHRINE (PF) 0.5% -1:200000 IJ SOLN
INTRAMUSCULAR | Status: AC
  Filled 2020-01-23: qty 30

## 2020-01-23 MED ORDER — ONDANSETRON HCL 4 MG/2ML IJ SOLN
INTRAMUSCULAR | Status: AC
  Filled 2020-01-23: qty 4

## 2020-01-23 MED ORDER — CEFAZOLIN SODIUM-DEXTROSE 2-4 GM/100ML-% IV SOLN
2.0000 g | INTRAVENOUS | Status: AC
  Administered 2020-01-23: 2 g via INTRAVENOUS
  Filled 2020-01-23: qty 100

## 2020-01-23 MED ORDER — ONDANSETRON HCL 4 MG/2ML IJ SOLN
INTRAMUSCULAR | Status: AC
  Filled 2020-01-23: qty 2

## 2020-01-23 MED ORDER — MIDAZOLAM HCL 2 MG/2ML IJ SOLN
INTRAMUSCULAR | Status: AC
  Filled 2020-01-23: qty 2

## 2020-01-23 MED ORDER — LACTATED RINGERS IV SOLN
Freq: Once | INTRAVENOUS | Status: AC
  Administered 2020-01-23: 1000 mL via INTRAVENOUS

## 2020-01-23 MED ORDER — PROMETHAZINE HCL 25 MG/ML IJ SOLN: 6.2500 mg | INTRAMUSCULAR | Status: DC | PRN

## 2020-01-23 MED ORDER — 0.9 % SODIUM CHLORIDE (POUR BTL) OPTIME
TOPICAL | Status: DC | PRN
  Administered 2020-01-23: 1000 mL

## 2020-01-23 MED ORDER — LIDOCAINE 2% (20 MG/ML) 5 ML SYRINGE
INTRAMUSCULAR | Status: AC
  Filled 2020-01-23: qty 10

## 2020-01-23 MED ORDER — ONDANSETRON HCL 4 MG/2ML IJ SOLN
INTRAMUSCULAR | Status: DC | PRN
  Administered 2020-01-23: 4 mg via INTRAVENOUS

## 2020-01-23 MED ORDER — BACITRACIN-NEOMYCIN-POLYMYXIN 400-5-5000 EX OINT
TOPICAL_OINTMENT | CUTANEOUS | Status: AC
  Filled 2020-01-23: qty 2

## 2020-01-23 MED ORDER — BUPIVACAINE-EPINEPHRINE 0.5% -1:200000 IJ SOLN
INTRAMUSCULAR | Status: DC | PRN
  Administered 2020-01-23: 14 mL

## 2020-01-23 MED ORDER — GLYCOPYRROLATE PF 0.2 MG/ML IJ SOSY
PREFILLED_SYRINGE | INTRAMUSCULAR | Status: AC
  Filled 2020-01-23: qty 1

## 2020-01-23 MED ORDER — MIDAZOLAM HCL 2 MG/2ML IJ SOLN
2.0000 mg | Freq: Once | INTRAMUSCULAR | Status: AC
  Administered 2020-01-23: 2 mg via INTRAVENOUS

## 2020-01-23 MED ORDER — GLYCOPYRROLATE 0.2 MG/ML IJ SOLN
INTRAMUSCULAR | Status: DC | PRN
  Administered 2020-01-23 (×2): .1 mg via INTRAVENOUS

## 2020-01-23 MED ORDER — FENTANYL CITRATE (PF) 250 MCG/5ML IJ SOLN
INTRAMUSCULAR | Status: AC
  Filled 2020-01-23: qty 5

## 2020-01-23 MED ORDER — FENTANYL CITRATE (PF) 100 MCG/2ML IJ SOLN: 25.0000 ug | INTRAMUSCULAR | Status: DC | PRN

## 2020-01-23 MED ORDER — PROPOFOL 10 MG/ML IV BOLUS
INTRAVENOUS | Status: AC
  Filled 2020-01-23: qty 40

## 2020-01-23 MED ORDER — PROPOFOL 10 MG/ML IV BOLUS
INTRAVENOUS | Status: DC | PRN
  Administered 2020-01-23: 200 mg via INTRAVENOUS

## 2020-01-23 MED ORDER — SUCCINYLCHOLINE CHLORIDE 200 MG/10ML IV SOSY
PREFILLED_SYRINGE | INTRAVENOUS | Status: AC
  Filled 2020-01-23: qty 10

## 2020-01-23 MED ORDER — BACITRACIN-NEOMYCIN-POLYMYXIN 400-5-5000 EX OINT
TOPICAL_OINTMENT | CUTANEOUS | Status: DC | PRN
  Administered 2020-01-23: 1 via TOPICAL

## 2020-01-23 MED ORDER — LIDOCAINE HCL (CARDIAC) PF 100 MG/5ML IV SOSY
PREFILLED_SYRINGE | INTRAVENOUS | Status: DC | PRN
  Administered 2020-01-23: 100 mg via INTRAVENOUS

## 2020-01-23 MED ORDER — LACTATED RINGERS IV SOLN: INTRAVENOUS | Status: DC | PRN

## 2020-01-23 SURGICAL SUPPLY — 31 items
BANDAGE STRIP 1X3 FLEXIBLE (GAUZE/BANDAGES/DRESSINGS) ×4 IMPLANT
BLADE SURG 15 STRL LF DISP TIS (BLADE) IMPLANT
BLADE SURG 15 STRL SS (BLADE) ×3
CLOSURE WOUND 1/2 X4 (GAUZE/BANDAGES/DRESSINGS) ×1
CLOTH BEACON ORANGE TIMEOUT ST (SAFETY) ×3 IMPLANT
COVER LIGHT HANDLE STERIS (MISCELLANEOUS) ×6 IMPLANT
COVER WAND RF STERILE (DRAPES) ×3 IMPLANT
DECANTER SPIKE VIAL GLASS SM (MISCELLANEOUS) ×3 IMPLANT
DRAPE HALF SHEET 40X57 (DRAPES) ×3 IMPLANT
DRAPE STERI URO 9X17 APER PCH (DRAPES) ×3 IMPLANT
ELECT REM PT RETURN 9FT ADLT (ELECTROSURGICAL) ×3
ELECTRODE REM PT RTRN 9FT ADLT (ELECTROSURGICAL) ×1 IMPLANT
GAUZE SPONGE 4X4 16PLY XRAY LF (GAUZE/BANDAGES/DRESSINGS) ×3 IMPLANT
GLOVE BIO SURGEON STRL SZ7 (GLOVE) ×2 IMPLANT
GLOVE BIOGEL PI IND STRL 7.0 (GLOVE) ×2 IMPLANT
GLOVE BIOGEL PI IND STRL 9 (GLOVE) ×1 IMPLANT
GLOVE BIOGEL PI INDICATOR 7.0 (GLOVE) ×4
GLOVE BIOGEL PI INDICATOR 9 (GLOVE) ×2
GLOVE ECLIPSE 9.0 STRL (GLOVE) ×3 IMPLANT
GOWN SPEC L3 XXLG W/TWL (GOWN DISPOSABLE) ×3 IMPLANT
GOWN STRL REUS W/TWL LRG LVL3 (GOWN DISPOSABLE) ×3 IMPLANT
KIT TURNOVER KIT A (KITS) ×3 IMPLANT
MANIFOLD NEPTUNE II (INSTRUMENTS) ×3 IMPLANT
NS IRRIG 1000ML POUR BTL (IV SOLUTION) ×3 IMPLANT
PACK PERI GYN (CUSTOM PROCEDURE TRAY) ×3 IMPLANT
PAD ARMBOARD 7.5X6 YLW CONV (MISCELLANEOUS) ×3 IMPLANT
SET BASIN LINEN APH (SET/KITS/TRAYS/PACK) ×3 IMPLANT
STRIP CLOSURE SKIN 1/2X4 (GAUZE/BANDAGES/DRESSINGS) ×1 IMPLANT
SUT MON AB 3-0 SH 27 (SUTURE) ×2 IMPLANT
SUT VIC AB 4-0 PS2 27 (SUTURE) ×3 IMPLANT
SYR CONTROL 10ML LL (SYRINGE) ×2 IMPLANT

## 2020-01-23 NOTE — Anesthesia Preprocedure Evaluation (Addendum)
Anesthesia Evaluation  Patient identified by MRN, date of birth, ID band Patient awake    Reviewed: Allergy & Precautions, NPO status , Patient's Chart, lab work & pertinent test results  History of Anesthesia Complications (+) PONV, Family history of anesthesia reaction and history of anesthetic complications  Airway Mallampati: II  TM Distance: >3 FB Neck ROM: Full   Comment: Neck fusionx2 Dental  (+) Teeth Intact, Caps, Dental Advisory Given   Pulmonary Current Smoker and Patient abstained from smoking.,    Pulmonary exam normal breath sounds clear to auscultation       Cardiovascular Exercise Tolerance: Good hypertension, Pt. on medications Normal cardiovascular exam Rhythm:Regular Rate:Normal  09-Jan-2018 10:58:18 [facility] ROUTINE RECORD Normal sinus rhythm Normal ECG No previous tracing Confirmed by Shelva Majestic [id number]) on 01/09/2018 11:13:54 AM   Neuro/Psych PSYCHIATRIC DISORDERS Anxiety Depression  Neuromuscular disease    GI/Hepatic Neg liver ROS, GERD  Medicated and Controlled,  Endo/Other  diabetes, Well Controlled, Type 2  Renal/GU negative Renal ROS     Musculoskeletal  (+) Arthritis , Fibromyalgia -Neck sx x2   Abdominal   Peds  Hematology negative hematology ROS (+)   Anesthesia Other Findings   Reproductive/Obstetrics negative OB ROS                                                            Anesthesia Evaluation  Patient identified by MRN, date of birth, ID band Patient awake    Reviewed: Allergy & Precautions, NPO status , Patient's Chart, lab work & pertinent test results  Airway Mallampati: III  TM Distance: >3 FB Neck ROM: Full    Dental no notable dental hx.    Pulmonary Current Smoker,    Pulmonary exam normal breath sounds clear to auscultation       Cardiovascular hypertension, Pt. on medications Normal  cardiovascular exam Rhythm:Regular Rate:Normal  ECG: NSR, rate 79   Neuro/Psych PSYCHIATRIC DISORDERS Anxiety Depression PTSD Neuromuscular disease    GI/Hepatic Neg liver ROS, GERD  Medicated and Controlled,  Endo/Other  diabetes, Oral Hypoglycemic Agents  Renal/GU negative Renal ROS     Musculoskeletal  (+) Fibromyalgia -  Abdominal (+) + obese,   Peds  Hematology HLD   Anesthesia Other Findings RUQ pain  Reproductive/Obstetrics                            Anesthesia Physical Anesthesia Plan  ASA: III  Anesthesia Plan: General   Post-op Pain Management:    Induction: Intravenous  PONV Risk Score and Plan: 3 and Midazolam, Scopolamine patch - Pre-op, Dexamethasone, Ondansetron and Treatment may vary due to age or medical condition  Airway Management Planned: Oral ETT and Video Laryngoscope Planned  Additional Equipment:   Intra-op Plan:   Post-operative Plan: Extubation in OR  Informed Consent: I have reviewed the patients History and Physical, chart, labs and discussed the procedure including the risks, benefits and alternatives for the proposed anesthesia with the patient or authorized representative who has indicated his/her understanding and acceptance.   Dental advisory given  Plan Discussed with: CRNA  Anesthesia Plan Comments:        Anesthesia Quick Evaluation  Anesthesia Physical Anesthesia Plan  ASA: II  Anesthesia Plan: General  Post-op Pain Management:    Induction: Intravenous  PONV Risk Score and Plan: 4 or greater and Ondansetron, Midazolam and Metaclopromide  Airway Management Planned: LMA  Additional Equipment:   Intra-op Plan:   Post-operative Plan: Extubation in OR  Informed Consent: I have reviewed the patients History and Physical, chart, labs and discussed the procedure including the risks, benefits and alternatives for the proposed anesthesia with the patient or authorized representative  who has indicated his/her understanding and acceptance.     Dental advisory given  Plan Discussed with: CRNA and Surgeon  Anesthesia Plan Comments:       Anesthesia Quick Evaluation

## 2020-01-23 NOTE — Discharge Instructions (Signed)
Dr. Glo Herring may be reached for concerns at [phone] over the next week.  This is a cell phone number Please maintain good blood sugar control over the next week as it will improve healing Notify my office for fever increasing pain.  You should be able to take ibuprofen or the diclofenac and that should be adequate pain control.  Ice pack may be used every 2 hours for the first 24 hours for any local discomfort     Monitored Anesthesia Care, Care After These instructions provide you with information about caring for yourself after your procedure. Your health care provider may also give you more specific instructions. Your treatment has been planned according to current medical practices, but problems sometimes occur. Call your health care provider if you have any problems or questions after your procedure. What can I expect after the procedure? After your procedure, you may:  Feel sleepy for several hours.  Feel clumsy and have poor balance for several hours.  Feel forgetful about what happened after the procedure.  Have poor judgment for several hours.  Feel nauseous or vomit.  Have a sore throat if you had a breathing tube during the procedure. Follow these instructions at home: For at least 24 hours after the procedure:      Have a responsible adult stay with you. It is important to have someone help care for you until you are awake and alert.  Rest as needed.  Do not: ? Participate in activities in which you could fall or become injured. ? Drive. ? Use heavy machinery. ? Drink alcohol. ? Take sleeping pills or medicines that cause drowsiness. ? Make important decisions or sign legal documents. ? Take care of children on your own. Eating and drinking  Follow the diet that is recommended by your health care provider.  If you vomit, drink water, juice, or soup when you can drink without vomiting.  Make sure you have little or no nausea before eating solid  foods. General instructions  Take over-the-counter and prescription medicines only as told by your health care provider.  If you have sleep apnea, surgery and certain medicines can increase your risk for breathing problems. Follow instructions from your health care provider about wearing your sleep device: ? Anytime you are sleeping, including during daytime naps. ? While taking prescription pain medicines, sleeping medicines, or medicines that make you drowsy.  If you smoke, do not smoke without supervision.  Keep all follow-up visits as told by your health care provider. This is important. Contact a health care provider if:  You keep feeling nauseous or you keep vomiting.  You feel light-headed.  You develop a rash.  You have a fever. Get help right away if:  You have trouble breathing. Summary  For several hours after your procedure, you may feel sleepy and have poor judgment.  Have a responsible adult stay with you for at least 24 hours or until you are awake and alert. This information is not intended to replace advice given to you by your health care provider. Make sure you discuss any questions you have with your health care provider. Document Revised: 11/27/2017 Document Reviewed: 12/20/2015 Elsevier Patient Education  [place].

## 2020-01-23 NOTE — Interval H&P Note (Signed)
History and Physical Interval Note:  01/23/2020 7:20 AM  Aimee [name]  has presented today for surgery, with the diagnosis of sebaceous cyst pelvic pain.  The various methods of treatment have been discussed with the patient and family. After consideration of risks, benefits and other options for treatment, the patient has consented to  Procedure(s): EXCISION OF LABIAL VULVAR SEBCEOUS CYST (Left) as a surgical intervention.  The patient's history has been reviewed, patient examined, no change in status, stable for surgery.  I have reviewed the patient's chart and labs.  Questions were answered to the patient's satisfaction.   She did take the antibiotics. CBG is elevated due to eating half a watermelon last night.   Jonnie Kind

## 2020-01-23 NOTE — Anesthesia Postprocedure Evaluation (Signed)
Anesthesia Post Note  Patient: Aimee [name]  Procedure(s) Performed: EXCISION OF LABIAL VULVAR SEBCEOUS CYSTS (Left Vulva)  Patient location during evaluation: PACU Anesthesia Type: General Level of consciousness: awake and alert Pain management: pain level controlled Vital Signs Assessment: post-procedure vital signs reviewed and stable Respiratory status: spontaneous breathing Cardiovascular status: stable Postop Assessment: no apparent nausea or vomiting Anesthetic complications: no     Last Vitals:  Vitals:   01/23/20 0935 01/23/20 0942  BP:  104/75  Pulse: 82 84  Resp: 16 20  Temp:  36.7 C  SpO2: 100% 99%    Last Pain:  Vitals:   01/23/20 0942  TempSrc: Oral  PainSc: 4                  Everette Rank

## 2020-01-23 NOTE — H&P (Signed)
Preoperative History and Physical  Aimee [name] is a 49 y.o. No obstetric history on file. here for surgical management of recurrent vulvar sebaceous cysts She has a history of hidradenitis . No significant preoperative concerns the areas are 3, all on the left labia majora. All 3 areas are minimally inflamed at this time. Will place pt on antibiotics 4 days before surgery as precaustion  Proposed surgery: Excision of vulvar sebaceous cysts      Past Medical History:  Diagnosis Date  . Arthritis   . Atypical mole 10/31/2012   R neck-mod. ([place])  . Depression   . Diabetes mellitus    type II   . Family history of adverse reaction to anesthesia    mother- mother has lots of alleriges   . GERD (gastroesophageal reflux disease)   . Hidradenitis suppurativa   . Hidradenitis suppurativa   . Hypertension   . Hypertriglyceridemia   . Neuromuscular disorder ([place])    diabetic neuropathy   . PONV (postoperative nausea and vomiting)   . PTSD (post-traumatic stress disorder)   . PTSD (post-traumatic stress disorder)         Past Surgical History:  Procedure Laterality Date  . ABDOMINAL HYSTERECTOMY  2010  . ABDOMINOPLASTY  2006  . BREAST EXCISIONAL BIOPSY Right   . [place]   x2  . CHOLECYSTECTOMY N/A 01/11/2018   Procedure: LAPAROSCOPIC CHOLECYSTECTOMY; Surgeon: [name], MD; Location: [facility]; Service: General; Laterality: N/A; PT HAS PTSD AND IS AGGREGATED BY WAITING FOR LONG PERIODS OF TIME.  [facility] EAR SURGERY     12 surgeries on R ear starting in 1984; implant 06/2011  . left carpal tunnel surgery     . LIVER BIOPSY N/A 01/11/2018   Procedure: LIVER BIOPSY; Surgeon: [name], MD; Location: [facility]; Service: General; Laterality: N/A;  . SHOULDER ARTHROSCOPY Left 10/2015  . THORACIC [place] SURGERY  2011, 2012   x2  . TONSILLECTOMY  12/26/2017  . TUBAL LIGATION     OB History  No obstetric history on file.  Patient denies any other pertinent gynecologic  issues.        Current Outpatient Medications on File Prior to Visit  Medication Sig Dispense Refill  . amLODipine (NORVASC) 5 MG tablet Take 1 tablet (5 mg total) by mouth daily. 90 tablet 0  . aspirin 81 MG EC tablet TAKE 1 TABLET BY MOUTH EVERY DAY 90 tablet 1  . atorvastatin (LIPITOR) 40 MG tablet Take 1 tablet (40 mg total) by mouth daily. 90 tablet 1  . Biotin 10000 MCG TABS Take 10,000 mcg by mouth daily.    . Calcium Carb-Cholecalciferol (CALCIUM 600 + D PO) Take 1 tablet by mouth daily.    . citalopram (CELEXA) 40 MG tablet Take 1 tablet (40 mg total) by mouth daily. (Needs to be seen before next refill) 90 tablet 0  . cyanocobalamin (,VITAMIN B-12,) 1000 MCG/ML injection Inject 1 mL (1,000 mcg total) into the muscle every 30 (thirty) days. 3 mL 3  . diclofenac (VOLTAREN) 75 MG EC tablet TAKE 1 TABLET TWICE A DAY (Patient taking differently: Take 75 mg by mouth 2 (two) times daily. ) 180 tablet 0  . dicyclomine (BENTYL) 10 MG capsule TAKE ONE CAPSULE THREE TIMES A DAY AS NEEDED FOR ABDOMINAL CRAMPING AND DIARRHEA (Patient taking differently: Take 10 mg by mouth 3 (three) times daily as needed for spasms (and diarrhea). ) 270 capsule 1  . ezetimibe (ZETIA) 10 MG tablet Take  1 tablet (10 mg total) by mouth daily. 90 tablet 1  . fenofibrate 160 MG tablet Take 1 tablet (160 mg total) by mouth daily. For cholesterol and triglyceride 90 tablet 1  . glucose blood test strip Use as instructed 200 each 3  . hydrOXYzine (VISTARIL) 50 MG capsule Take one in am for anxiety and two at bed time. (Patient taking differently: Take 50-100 mg by mouth See admin instructions. Take 50 mg by mouth in the morning and 100 mg at bedtime) 270 capsule 0  . JARDIANCE 10 MG TABS tablet TAKE 1 TABLET BY MOUTH ONCE DAILY TO CONTROL DIABETES (Patient taking differently: Take 10 mg by mouth daily. ) 30 tablet 0  . lamoTRIgine (LAMICTAL) 100 MG tablet Take 1 tablet (100 mg total) by mouth daily. 90 tablet 0  . Lancets  MISC Use to check blood sugars twice daily 200 each 3  . lidocaine (LIDODERM) 5 % PLACE 1 PATCH ONTO THE SKIN DAILY. REMOVE & DISCARD PATCH WITHIN 12 HOURS OR AS DIRECTED BY MD (Patient taking differently: Place 1 patch onto the skin daily as needed (pain). ) 30 patch 5  . NARCAN 4 MG/0.1ML LIQD nasal spray kit Place 0.4 mg into the nose once.   0  . Omega-3 Fatty Acids (FISH OIL) 1200 MG CAPS Take 1,200 mg by mouth in the morning and at bedtime.    Marland Kitchen omeprazole (PRILOSEC) 20 MG capsule Take 1 capsule (20 mg total) by mouth daily. 90 capsule 1  . amoxicillin-clavulanate (AUGMENTIN) 500-125 MG tablet Take 1 tablet (500 mg total) by mouth 3 (three) times daily. (Patient not taking: Reported on 01/07/2020) 30 tablet 0  . baclofen (LIORESAL) 10 MG tablet Take 1 tablet (10 mg total) by mouth 3 (three) times daily. 270 tablet 1  . diclofenac sodium (VOLTAREN) 1 % GEL Apply 4 g topically 4 (four) times daily. As needed for neck and back pain 100 g 1   No current facility-administered medications on file prior to visit.        Allergies  Allergen Reactions  . Hydrocodone Itching and Other (See Comments)    And family of drugs   . Oxycodone Itching  . Tramadol Itching  . Mirtazapine Other (See Comments)    xs hunger, foot tapping idiosynchronously  . Percocet [Oxycodone-Acetaminophen]   . Darvocet [Propoxyphene N-Acetaminophen] Itching   Social History: reports that she has been smoking cigarettes. She has a 5.75 pack-year smoking history. She has never used smokeless tobacco. She reports that she does not drink alcohol or use drugs.       Family History  Problem Relation Age of Onset  . GI problems Mother   . Mental illness Mother   . Bipolar disorder Mother   . Diabetes Father   . Breast cancer Maternal Aunt    in 10's  . Liver cancer Maternal Aunt   . Bipolar disorder Maternal Aunt   . Schizophrenia Maternal Aunt   . Personality disorder Maternal Aunt   . Colon cancer Maternal  Grandmother   . Heart attack Maternal Grandfather   . Heart attack Paternal Grandmother   . Heart attack Paternal Grandfather   . Heart attack Paternal Uncle   . Lymphoma Paternal Uncle   . Bone cancer Cousin    Review of Systems: Noncontributory  PHYSICAL EXAM:  Blood pressure 115/77, pulse 90, height 5' 8"  (1.727 m), weight 198 lb 3.2 oz (89.9 kg).  General appearance - alert, well appearing, and in no distress  Chest - clear to auscultation, no wheezes, rales or rhonchi, symmetric air entry  Heart - normal rate and regular rhythm  Abdomen - soft, nontender, nondistended, no masses or organomegaly  Well healed s/p abdominoplasty  Pelvic - examination 3 areas on left labia majora with chronic scarring over a deep nodule 1- 2 cm in maximal diameter, lowest is just left of posterior fouchette, middle lesions is even with introitus, 3 oclock, and upper lesion is on left mons pubis.  Extremities - peripheral pulses normal, no pedal edema, no clubbing or cyanosis  Labs:  CBC    Component Value Date/Time   WBC 8.8 01/21/2020 1118   RBC 4.98 01/21/2020 1118   HGB 15.6 (H) 01/21/2020 1118   HGB 16.1 (H) 10/15/2019 1153   HCT 48.0 (H) 01/21/2020 1118   HCT 45.9 10/15/2019 1153   PLT 287 01/21/2020 1118   PLT 347 10/15/2019 1153   MCV 96.4 01/21/2020 1118   MCV 94 10/15/2019 1153   MCH 31.3 01/21/2020 1118   MCHC 32.5 01/21/2020 1118   RDW 13.3 01/21/2020 1118   RDW 12.2 10/15/2019 1153   LYMPHSABS 3.4 (H) 10/15/2019 1153   MONOABS 0.4 02/12/2018 1122   EOSABS 0.2 10/15/2019 1153   BASOSABS 0.1 10/15/2019 1153   CMP     Component Value Date/Time   NA 140 01/21/2020 1118   NA 143 10/15/2019 1153   K 4.2 01/21/2020 1118   CL 104 01/21/2020 1118   CO2 25 01/21/2020 1118   GLUCOSE 183 (H) 01/21/2020 1118   BUN 18 01/21/2020 1118   BUN 11 10/15/2019 1153   CREATININE 0.70 01/21/2020 1118   CREATININE 0.61 07/25/2013 1027   CALCIUM 9.2 01/21/2020 1118   PROT 7.1 01/21/2020  1118   PROT 7.6 10/15/2019 1153   ALBUMIN 3.8 01/21/2020 1118   ALBUMIN 4.5 10/15/2019 1153   AST 19 01/21/2020 1118   ALT 28 01/21/2020 1118   ALKPHOS 47 01/21/2020 1118   BILITOT 0.6 01/21/2020 1118   BILITOT 0.3 10/15/2019 1153   GFRNONAA >60 01/21/2020 1118   GFRNONAA >89 07/25/2013 1027   GFRAA >60 01/21/2020 1118   GFRAA >89 07/25/2013 1027    CBG this morning is elevated at 218, as pt ate half a watermelon last night, stress eating, and did not adjust her medications.She did take her antibiotics.   Imaging Studies:  Imaging Results    Assessment:      Patient Active Problem List   Diagnosis Date Noted  . Hidradenitis suppurativa   . Unilateral primary osteoarthritis, left hip 10/22/2019  . Hx of fusion of cervical spine 02/27/2019  . Tendinopathy of rotator cuff, left 02/27/2019  . Aortic atherosclerosis ([place]) 05/22/2018  . Ganglion cyst of finger 09/13/2017  . Status post placement of bone anchored hearing aid (BAHA) 08/14/2017  . Fibromyalgia 11/29/2016  . Lumbar radiculopathy 11/21/2016  . Essential hypertension 04/22/2016  . Severe obesity (BMI >= 40) ([place]) 10/08/2015  . PTSD (post-traumatic stress disorder) 09/10/2015  . Trapezius strain 08/19/2015  . Depression 08/19/2015  . Hypertriglyceridemia 11/12/2014  . Peripheral neuropathy 11/12/2014  . DDD (degenerative disc disease), lumbar 11/12/2014  . Varicose veins of leg with pain, bilateral 05/12/2014  . Diabetes ([place]) 03/05/2013  . Smoking 03/05/2013   Plan:  Patient will undergo surgical management with wide excision of vulvar sebaceous cysts.Jonnie Kind, MD  .mec  01/15/2020 10:34 AM\

## 2020-01-23 NOTE — Anesthesia Procedure Notes (Signed)
Procedure Name: LMA Insertion Date/Time: 01/23/2020 9:54 AM Performed by: Georgeanne Nim, CRNA Pre-anesthesia Checklist: Patient identified, Emergency Drugs available, Suction available, Patient being monitored and Timeout performed Patient Re-evaluated:Patient Re-evaluated prior to induction Oxygen Delivery Method: Circle system utilized Preoxygenation: Pre-oxygenation with 100% oxygen Induction Type: IV induction Ventilation: Mask ventilation without difficulty LMA: LMA inserted LMA Size: 4.0 Number of attempts: 1 Placement Confirmation: positive ETCO2,  CO2 detector and breath sounds checked- equal and bilateral Tube secured with: Tape Dental Injury: Teeth and Oropharynx as per pre-operative assessment

## 2020-01-23 NOTE — Op Note (Signed)
01/23/2020  9:06 AM  PATIENT:  Aimee [name]  49 y.o. female  PRE-OPERATIVE DIAGNOSIS:  sebaceous cysts, history of hidradenitis pelvic pain  POST-OPERATIVE DIAGNOSIS:  sebaceous cysts, history of hidradenitis pelvic pain  PROCEDURE:  Procedure(s): EXCISION OF LABIAL VULVAR SEBCEOUS CYSTS (Left) x3  SURGEON:  Surgeon(s) and Role:    Jonnie Kind, MD - Primary  PHYSICIAN ASSISTANT:   ASSISTANTS: none   ANESTHESIA:   local and general  EBL:  10 mL   BLOOD ADMINISTERED:none  DRAINS: none   LOCAL MEDICATIONS USED:  MARCAINE    and Amount: 10 ml  SPECIMEN:  Source of Specimen:  Vulvar biopsies  DISPOSITION OF SPECIMEN:  PATHOLOGY  COUNTS:  YES  TOURNIQUET:  * No tourniquets in log *  DICTATION: .Dragon Dictation  PLAN OF CARE: Discharge to home after PACU  PATIENT DISPOSITION:  PACU - hemodynamically stable.   Delay start of Pharmacological VTE agent (>24hrs) due to surgical blood loss or risk of bleeding: not applicable Details of procedure: Patient taken operating room prepped and draped for vaginal procedure, timeout conducted, Ancef administered.  Procedure confirmed by operative team.  Vulvar lesions were identified.  A chronic scarred area to the left of the posterior fourchette, small sebaceous cyst in the thigh crease on the left crease, and a chronic scarring area on the mons pubis to the left of the midline. Starting at the posterior fourchette lesion, an elliptical incision was made approximately 2 cm in length encircling the 2 sinus tract openings, and then once the ellipse was through the skin it was dissected down to the base and under the old chronic scarring.  Point cautery was used as necessary to achieve hemostasis.  Deep layer was closed with 2 interrupted sutures of 3-0 Monocryl absorbable suture, Subcuticular 4-0 Vicryl was used to close the skin. The middle sebaceous cyst was grasped and a small ellipse of skin removed and the sebaceous cyst  beneath it this was obviously benign and was discarded.  The subcuticular 4-0 Vicryl closure of the skin was with a single stitch.  The third area of concern was the left mons pubis which was again excised in an elliptical incision along the parallel approximately 2 cm from the inguinal crease.  This was sharply dissected down into the normal healthy appearing fatty tissue and excised in 1 intact specimen.  Healthy tissues on either side were confirmed.  The deep layer was again closed with a running continuous suture of 3-0 Monocryl absorbable suture. Subcuticular 4-0 Vicryl was used to close the skin in a running fashion Steri-Strips were applied to the upper lesion Band-Aid applied and patient allowed to go to recovery room in stable condition.  Topical Neosporin was applied to the smaller perineal incisions.  Sponge and needle counts correct

## 2020-01-23 NOTE — Brief Op Note (Signed)
01/23/2020  9:06 AM  PATIENT:  Aimee [name]  49 y.o. female  PRE-OPERATIVE DIAGNOSIS:  sebaceous cysts, history of hidradenitis pelvic pain  POST-OPERATIVE DIAGNOSIS:  sebaceous cysts, history of hidradenitis pelvic pain  PROCEDURE:  Procedure(s): EXCISION OF LABIAL VULVAR SEBCEOUS CYSTS (Left) x3  SURGEON:  Surgeon(s) and Role:    Jonnie Kind, MD - Primary  PHYSICIAN ASSISTANT:   ASSISTANTS: none   ANESTHESIA:   local and general  EBL:  10 mL   BLOOD ADMINISTERED:none  DRAINS: none   LOCAL MEDICATIONS USED:  MARCAINE    and Amount: 10 ml  SPECIMEN:  Source of Specimen:  Vulvar biopsies  DISPOSITION OF SPECIMEN:  PATHOLOGY  COUNTS:  YES  TOURNIQUET:  * No tourniquets in log *  DICTATION: .Dragon Dictation  PLAN OF CARE: Discharge to home after PACU  PATIENT DISPOSITION:  PACU - hemodynamically stable.   Delay start of Pharmacological VTE agent (>24hrs) due to surgical blood loss or risk of bleeding: not applicable Details of procedure: Patient taken operating room prepped and draped for vaginal procedure, timeout conducted, Ancef administered.  Procedure confirmed by operative team.  Vulvar lesions were identified.  A chronic scarred area to the left of the posterior fourchette, small sebaceous cyst in the thigh crease on the left crease, and a chronic scarring area on the mons pubis to the left of the midline. Starting at the posterior fourchette lesion, an elliptical incision was made approximately 2 cm in length encircling the 2 sinus tract openings, and then once the ellipse was through the skin it was dissected down to the base and under the old chronic scarring.  Point cautery was used as necessary to achieve hemostasis.  Deep layer was closed with 2 interrupted sutures of 3-0 Monocryl absorbable suture, Subcuticular 4-0 Vicryl was used to close the skin. The middle sebaceous cyst was grasped and a small ellipse of skin removed and the sebaceous cyst  beneath it this was obviously benign and was discarded.  The subcuticular 4-0 Vicryl closure of the skin was with a single stitch.  The third area of concern was the left mons pubis which was again excised in an elliptical incision along the parallel approximately 2 cm from the inguinal crease.  This was sharply dissected down into the normal healthy appearing fatty tissue and excised in 1 intact specimen.  Healthy tissues on either side were confirmed.  The deep layer was again closed with a running continuous suture of 3-0 Monocryl absorbable suture. Subcuticular 4-0 Vicryl was used to close the skin in a running fashion Steri-Strips were applied to the upper lesion Band-Aid applied and patient allowed to go to recovery room in stable condition.  Topical Neosporin was applied to the smaller perineal incisions.  Sponge and needle counts correct

## 2020-01-23 NOTE — Transfer of Care (Signed)
Immediate Anesthesia Transfer of Care Note  Patient: Aimee [name]  Procedure(s) Performed: EXCISION OF LABIAL VULVAR SEBCEOUS CYSTS (Left Vulva)  Patient Location: PACU  Anesthesia Type:General  Level of Consciousness: drowsy and patient cooperative  Airway & Oxygen Therapy: Patient Spontanous Breathing and Patient connected to nasal cannula oxygen  Post-op Assessment: Report given to RN and Post -op Vital signs reviewed and stable  Post vital signs: Reviewed and stable  Last Vitals:  Vitals Value Taken Time  BP 91/62 01/23/20 0900  Temp    Pulse 89 01/23/20 0900  Resp 17 01/23/20 0900  SpO2 88 % 01/23/20 0900  Vitals shown include unvalidated device data.  Last Pain:  Vitals:   01/23/20 0649  TempSrc: Oral  PainSc: 2       Patients Stated Pain Goal: 6 (03/49/61 1643)  Complications: No apparent anesthesia complications

## 2020-01-24 LAB — SURGICAL PATHOLOGY

## 2020-01-27 ENCOUNTER — Other Ambulatory Visit: Payer: Self-pay | Admitting: Family Medicine

## 2020-01-27 DIAGNOSIS — I700 Atherosclerosis of aorta: Secondary | ICD-10-CM

## 2020-01-30 NOTE — Addendum Note (Signed)
Addended by: Robyne Askew R on: 01/30/2020 01:42 PM   Modules accepted: Level of Service

## 2020-02-03 ENCOUNTER — Ambulatory Visit (INDEPENDENT_AMBULATORY_CARE_PROVIDER_SITE_OTHER): Payer: BC Managed Care – PPO | Admitting: Obstetrics and Gynecology

## 2020-02-03 ENCOUNTER — Encounter: Payer: Self-pay | Admitting: Obstetrics and Gynecology

## 2020-02-03 VITALS — BP 115/82 | HR 89 | Wt 194.0 lb

## 2020-02-03 DIAGNOSIS — Z09 Encounter for follow-up examination after completed treatment for conditions other than malignant neoplasm: Secondary | ICD-10-CM

## 2020-02-03 DIAGNOSIS — T8131XD Disruption of external operation (surgical) wound, not elsewhere classified, subsequent encounter: Secondary | ICD-10-CM

## 2020-02-03 DIAGNOSIS — L732 Hidradenitis suppurativa: Secondary | ICD-10-CM

## 2020-02-03 DIAGNOSIS — Z48816 Encounter for surgical aftercare following surgery on the genitourinary system: Secondary | ICD-10-CM

## 2020-02-03 MED ORDER — AMOXICILLIN-POT CLAVULANATE 500-125 MG PO TABS: 1.0000 | ORAL_TABLET | Freq: Three times a day (TID) | ORAL | 0 refills | Status: DC

## 2020-02-03 MED ORDER — FLUCONAZOLE 150 MG PO TABS: 150.0000 mg | ORAL_TABLET | ORAL | 1 refills | Status: DC

## 2020-02-03 NOTE — Progress Notes (Signed)
PATIENT ID: Aimee [name], female     DOB: [birth year], 49 y.o.     MRN: [id number]  Subjective:  Aimee [name] is a 49 y.o. female now 2 weeks status post excision of labial vulvar sebacious cysts.    she had 3 lesions removed, and had an area on left just between the labia majora and minora that was unable to be identified even with exam under anesthesia, that has flared up now.  We wlll allow current healing to complete, then readdress the untreated lesion. She had separation of the lower lesion on the posterior fourchette, and the sub q suture is visible, intact nearest posterior fourchette and loose at lateral end of lesion.  ABLE to place a single suture of 4-0 vicryl near lateral end of separated skin, and then tie the sub q 4-0 monocryl to that anchor  Review of Systems Negative except tender, will need abx and Diflucan as precaution   Diet:   reg   Bowel movements : normal.  Pain is controlled without any medications.  Objective:  There were no vitals taken for this visit. General:Well developed, well nourished.  No acute distress. Abdomen: Bowel sounds normal, soft, non-tender. Pelvic Exam:    External Genitalia:  Normal. See note above    Vagina: Normal      Incision(s): Healing we;; at mons pubis and lateral labia majora, , no drainage, no erythema, no hernia, no swelling, no dehiscence The inferior lesion on left is reapproximated Suture separated at the upper aspects and a single stitch of vicryl put in and attached at the end of the suture that was previously placed.     Assessment:  Post-Op 2 weeks s/p excision of labial vulvar sebacious cysts   Wound separation of lower defect postoperatively.  Closed at this time Plan:  1. Wound care discussed  May tub bache 2. Current medications: Oral antibiotics. Diflucan 3. Activity restrictions: no bending, stooping, or squatting 4. return to work: 2-3 weeks. 5. Follow up in 3 weeks.   By signing my name below, I, [employer], attest that this documentation has been prepared under the direction and in the presence of Jonnie Kind, MD. Electronically Signed: [place]. 02/03/20. 11:12 AM.  I personally performed the services described in this documentation, which was SCRIBED in my presence. The recorded information has been reviewed and considered accurate. It has been edited as necessary during review. Jonnie Kind, MD

## 2020-02-04 ENCOUNTER — Other Ambulatory Visit: Payer: Self-pay | Admitting: Family Medicine

## 2020-02-04 DIAGNOSIS — E1165 Type 2 diabetes mellitus with hyperglycemia: Secondary | ICD-10-CM

## 2020-02-04 DIAGNOSIS — E1142 Type 2 diabetes mellitus with diabetic polyneuropathy: Secondary | ICD-10-CM

## 2020-02-06 ENCOUNTER — Other Ambulatory Visit: Payer: BC Managed Care – PPO

## 2020-02-06 ENCOUNTER — Other Ambulatory Visit: Payer: Self-pay

## 2020-02-06 DIAGNOSIS — E781 Pure hyperglyceridemia: Secondary | ICD-10-CM | POA: Diagnosis not present

## 2020-02-06 DIAGNOSIS — I10 Essential (primary) hypertension: Secondary | ICD-10-CM | POA: Diagnosis not present

## 2020-02-06 DIAGNOSIS — E1142 Type 2 diabetes mellitus with diabetic polyneuropathy: Secondary | ICD-10-CM | POA: Diagnosis not present

## 2020-02-06 DIAGNOSIS — M542 Cervicalgia: Secondary | ICD-10-CM | POA: Diagnosis not present

## 2020-02-06 LAB — BAYER DCA HB A1C WAIVED: HB A1C (BAYER DCA - WAIVED): 7.1 % — ABNORMAL HIGH (ref <7.0)

## 2020-02-07 LAB — CMP14+EGFR
ALT: 17 IU/L (ref 0–32)
AST: 13 IU/L (ref 0–40)
Albumin/Globulin Ratio: 1.4 (ref 1.2–2.2)
Albumin: 4.1 g/dL (ref 3.8–4.8)
Alkaline Phosphatase: 64 IU/L (ref 48–121)
BUN/Creatinine Ratio: 19 (ref 9–23)
BUN: 13 mg/dL (ref 6–24)
Bilirubin Total: <0.2 mg/dL (ref 0.0–1.2)
CO2: 22 mmol/L (ref 20–29)
Calcium: 9.4 mg/dL (ref 8.7–10.2)
Chloride: 102 mmol/L (ref 96–106)
Creatinine, Ser: 0.67 mg/dL (ref 0.57–1.00)
GFR calc Af Amer: 119 mL/min/{1.73_m2} (ref >59)
GFR calc non Af Amer: 104 mL/min/{1.73_m2} (ref >59)
Globulin, Total: 2.9 g/dL (ref 1.5–4.5)
Glucose: 152 mg/dL — ABNORMAL HIGH (ref 65–99)
Potassium: 4.3 mmol/L (ref 3.5–5.2)
Sodium: 142 mmol/L (ref 134–144)
Total Protein: 7 g/dL (ref 6.0–8.5)

## 2020-02-07 LAB — CBC WITH DIFFERENTIAL/PLATELET
Basophils Absolute: 0.1 10*3/uL (ref 0.0–0.2)
Basos: 1 %
EOS (ABSOLUTE): 0.3 10*3/uL (ref 0.0–0.4)
Eos: 3 %
Hematocrit: 45.9 % (ref 34.0–46.6)
Hemoglobin: 15.6 g/dL (ref 11.1–15.9)
Immature Grans (Abs): 0 10*3/uL (ref 0.0–0.1)
Immature Granulocytes: 0 %
Lymphocytes Absolute: 4.1 10*3/uL — ABNORMAL HIGH (ref 0.7–3.1)
Lymphs: 40 %
MCH: 31.6 pg (ref 26.6–33.0)
MCHC: 34 g/dL (ref 31.5–35.7)
MCV: 93 fL (ref 79–97)
Monocytes Absolute: 0.4 10*3/uL (ref 0.1–0.9)
Monocytes: 4 %
Neutrophils Absolute: 5.4 10*3/uL (ref 1.4–7.0)
Neutrophils: 52 %
Platelets: 294 10*3/uL (ref 150–450)
RBC: 4.94 x10E6/uL (ref 3.77–5.28)
RDW: 12.5 % (ref 11.7–15.4)
WBC: 10.1 10*3/uL (ref 3.4–10.8)

## 2020-02-07 LAB — LIPID PANEL
Chol/HDL Ratio: 3.5 ratio (ref 0.0–4.4)
Cholesterol, Total: 127 mg/dL (ref 100–199)
HDL: 36 mg/dL — ABNORMAL LOW (ref >39)
LDL Chol Calc (NIH): 52 mg/dL (ref 0–99)
Triglycerides: 246 mg/dL — ABNORMAL HIGH (ref 0–149)
VLDL Cholesterol Cal: 39 mg/dL (ref 5–40)

## 2020-02-13 ENCOUNTER — Ambulatory Visit: Payer: BC Managed Care – PPO | Admitting: Family Medicine

## 2020-02-13 ENCOUNTER — Encounter: Payer: Self-pay | Admitting: Family Medicine

## 2020-02-13 ENCOUNTER — Other Ambulatory Visit: Payer: Self-pay

## 2020-02-13 VITALS — BP 116/78 | HR 80 | Temp 98.2°F | Ht 68.0 in | Wt 195.6 lb

## 2020-02-13 DIAGNOSIS — E1142 Type 2 diabetes mellitus with diabetic polyneuropathy: Secondary | ICD-10-CM | POA: Diagnosis not present

## 2020-02-13 DIAGNOSIS — E782 Mixed hyperlipidemia: Secondary | ICD-10-CM | POA: Diagnosis not present

## 2020-02-16 ENCOUNTER — Encounter: Payer: Self-pay | Admitting: Family Medicine

## 2020-02-17 ENCOUNTER — Telehealth: Payer: Self-pay | Admitting: Obstetrics and Gynecology

## 2020-02-17 DIAGNOSIS — Z9689 Presence of other specified functional implants: Secondary | ICD-10-CM | POA: Diagnosis not present

## 2020-02-17 DIAGNOSIS — Z981 Arthrodesis status: Secondary | ICD-10-CM | POA: Diagnosis not present

## 2020-02-17 DIAGNOSIS — M961 Postlaminectomy syndrome, not elsewhere classified: Secondary | ICD-10-CM | POA: Diagnosis not present

## 2020-02-17 DIAGNOSIS — G894 Chronic pain syndrome: Secondary | ICD-10-CM | POA: Diagnosis not present

## 2020-02-17 DIAGNOSIS — M5136 Other intervertebral disc degeneration, lumbar region: Secondary | ICD-10-CM | POA: Diagnosis not present

## 2020-02-17 DIAGNOSIS — M5030 Other cervical disc degeneration, unspecified cervical region: Secondary | ICD-10-CM | POA: Diagnosis not present

## 2020-02-17 DIAGNOSIS — M47817 Spondylosis without myelopathy or radiculopathy, lumbosacral region: Secondary | ICD-10-CM | POA: Diagnosis not present

## 2020-02-17 DIAGNOSIS — M542 Cervicalgia: Secondary | ICD-10-CM | POA: Diagnosis not present

## 2020-02-17 NOTE — Telephone Encounter (Signed)
Photos in my chart found and reviewed.  I am unable to discern the photo clearly but there seems to be breakdown of wound healing.  Left message for pt with my cell.

## 2020-02-18 ENCOUNTER — Encounter: Payer: Self-pay | Admitting: Family Medicine

## 2020-02-18 NOTE — Progress Notes (Signed)
Subjective:  Patient ID: Aimee [name], female    DOB: 1971/01/23  Age: 49 y.o. MRN: [id number]  CC: Follow-up (3 month)   HPI Aimee [name] presents forFollow-up of diabetes. Patient checks blood sugar at home.  Patient denies symptoms such as polyuria, polydipsia, excessive hunger, nausea No significant hypoglycemic spells noted. Medications reviewed. Pt reports taking them regularly without complication/adverse reaction being reported today.  Labs done in advance of this visit show A1c to be 7.1   in for follow-up of elevated cholesterol. Doing well without complaints on current medication. Denies side effects of statin including myalgia and arthralgia and nausea. Currently no chest pain, shortness of breath or other cardiovascular related symptoms noted.   History Aimee [name] has a past medical history of Arthritis, Atypical mole (10/31/2012), Depression, Diabetes mellitus, Family history of adverse reaction to anesthesia, GERD (gastroesophageal reflux disease), Hidradenitis suppurativa, Hidradenitis suppurativa, Hypertension, Hypertriglyceridemia, Neuromuscular disorder ([place]), PONV (postoperative nausea and vomiting), PTSD (post-traumatic stress disorder), and PTSD (post-traumatic stress disorder).   She has a past surgical history that includes Abdominoplasty (2006); Inner ear surgery; Thoracic disc surgery (2011, 2012); Abdominal hysterectomy (2010); Cesarean section (1991, 1995); Shoulder arthroscopy (Left, 10/2015); Breast excisional biopsy (Right); Tubal ligation; left  carpal tunnel surgery ; Tonsillectomy (12/26/2017); Cholecystectomy (N/A, 01/11/2018); Liver biopsy (N/A, 01/11/2018); and Excision vaginal cyst (Left, 01/23/2020).   Her family history includes Bipolar disorder in her maternal aunt and mother; Bone cancer in her cousin; Breast cancer in her maternal aunt; Colon cancer in her maternal grandmother; Diabetes in her father; GI problems in her mother; Heart attack in her maternal  grandfather, paternal grandfather, paternal grandmother, and paternal uncle; Liver cancer in her maternal aunt; Lymphoma in her paternal uncle; Mental illness in her mother; Personality disorder in her maternal aunt; Schizophrenia in her maternal aunt.She reports that she has been smoking cigarettes. She has a 5.75 pack-year smoking history. She has never used smokeless tobacco. She reports that she does not drink alcohol or use drugs.  Current Outpatient Medications on File Prior to Visit  Medication Sig Dispense Refill  . amLODipine (NORVASC) 5 MG tablet Take 1 tablet (5 mg total) by mouth daily. 90 tablet 0  . aspirin 81 MG EC tablet TAKE 1 TABLET BY MOUTH EVERY DAY 90 tablet 0  . atorvastatin (LIPITOR) 40 MG tablet Take 1 tablet (40 mg total) by mouth daily. 90 tablet 0  . baclofen (LIORESAL) 10 MG tablet Take 1 tablet (10 mg total) by mouth 3 (three) times daily. 270 tablet 1  . Biotin 10000 MCG TABS Take 10,000 mcg by mouth daily.    . Calcium Carb-Cholecalciferol (CALCIUM 600 + D PO) Take 1 tablet by mouth daily.    . citalopram (CELEXA) 40 MG tablet Take 1 tablet (40 mg total) by mouth daily. (Needs to be seen before next refill) 90 tablet 0  . cyanocobalamin (,VITAMIN B-12,) 1000 MCG/ML injection Inject 1 mL (1,000 mcg total) into the muscle every 30 (thirty) days. 3 mL 3  . diclofenac (VOLTAREN) 75 MG EC tablet TAKE 1 TABLET TWICE A DAY (Patient taking differently: Take 75 mg by mouth 2 (two) times daily. ) 180 tablet 0  . diclofenac sodium (VOLTAREN) 1 % GEL Apply 4 g topically 4 (four) times daily. As needed for neck and back pain 100 g 1  . dicyclomine (BENTYL) 10 MG capsule TAKE ONE CAPSULE THREE TIMES A DAY AS NEEDED FOR ABDOMINAL CRAMPING AND DIARRHEA (Patient taking differently: Take 10 mg by mouth 3 (three)  times daily as needed for spasms (and diarrhea). ) 270 capsule 1  . ezetimibe (ZETIA) 10 MG tablet Take 1 tablet (10 mg total) by mouth daily. 90 tablet 1  . fenofibrate 160 MG  tablet Take 1 tablet (160 mg total) by mouth daily. For cholesterol and triglyceride 90 tablet 1  . fluconazole (DIFLUCAN) 150 MG tablet Take 1 tablet (150 mg total) by mouth every 3 (three) days. 2 tablet 1  . glucose blood test strip Use as instructed 200 each 3  . hydrOXYzine (VISTARIL) 50 MG capsule Take one in am for anxiety and two at bed time. (Patient taking differently: Take 50-100 mg by mouth See admin instructions. Take 50 mg by mouth in the morning and 100 mg at bedtime) 270 capsule 0  . JARDIANCE 10 MG TABS tablet TAKE 1 TABLET BY MOUTH ONCE DAILY TO CONTROL DIABETES 30 tablet 0  . lamoTRIgine (LAMICTAL) 100 MG tablet Take 1 tablet (100 mg total) by mouth daily. 90 tablet 0  . Lancets MISC Use to check blood sugars twice daily 200 each 3  . lidocaine (LIDODERM) 5 % PLACE 1 PATCH ONTO THE SKIN DAILY. REMOVE & DISCARD PATCH WITHIN 12 HOURS OR AS DIRECTED BY MD (Patient taking differently: Place 1 patch onto the skin daily as needed (pain). ) 30 patch 5  . NARCAN 4 MG/0.1ML LIQD nasal spray kit Place 0.4 mg into the nose once.   0  . Omega-3 Fatty Acids (FISH OIL) 1200 MG CAPS Take 1,200 mg by mouth in the morning and at bedtime.    Marland Kitchen omeprazole (PRILOSEC) 20 MG capsule Take 1 capsule (20 mg total) by mouth daily. 90 capsule 1   No current facility-administered medications on file prior to visit.    ROS Review of Systems  Constitutional: Negative.   HENT: Negative.   Eyes: Negative for visual disturbance.  Respiratory: Negative for shortness of breath.   Cardiovascular: Negative for chest pain.  Gastrointestinal: Negative for abdominal pain.  Musculoskeletal: Negative for arthralgias.    Objective:  BP 116/78   Pulse 80   Temp 98.2 F (36.8 C) (Temporal)   Ht _0  (1.727 m)   Wt 195 lb 9.6 oz (88.7 kg)   BMI 29.74 kg/m   BP Readings from Last 3 Encounters:  02/13/20 116/78  02/03/20 115/82  01/23/20 104/75    Wt Readings from Last 3 Encounters:  02/13/20 195 lb  9.6 oz (88.7 kg)  02/03/20 194 lb (88 kg)  01/21/20 198 lb (89.8 kg)     Physical Exam Constitutional:      General: She is not in acute distress.    Appearance: She is well-developed.  Cardiovascular:     Rate and Rhythm: Normal rate and regular rhythm.  Pulmonary:     Breath sounds: Normal breath sounds.  Skin:    General: Skin is warm and dry.  Neurological:     General: No focal deficit present.     Mental Status: She is alert and oriented to person, place, and time.  Psychiatric:        Mood and Affect: Mood normal.        Behavior: Behavior normal.    Results for orders placed or performed in visit on 02/06/20  Bayer DCA Hb A1c Waived  Result Value Ref Range   HB A1C (BAYER DCA - WAIVED) 7.1 (H) <7.0 %  CBC with Differential/Platelet  Result Value Ref Range   WBC 10.1 3.4 - 10.8 x10E3/uL  RBC 4.94 3.77 - 5.28 x10E6/uL   Hemoglobin 15.6 11.1 - 15.9 g/dL   Hematocrit 45.9 34.0 - 46.6 %   MCV 93 79 - 97 fL   MCH 31.6 26.6 - 33.0 pg   MCHC 34.0 31.5 - 35.7 g/dL   RDW 12.5 11.7 - 15.4 %   Platelets 294 150 - 450 x10E3/uL   Neutrophils 52 Not Estab. %   Lymphs 40 Not Estab. %   Monocytes 4 Not Estab. %   Eos 3 Not Estab. %   Basos 1 Not Estab. %   Neutrophils Absolute 5.4 1.4 - 7.0 x10E3/uL   Lymphocytes Absolute 4.1 (H) 0.7 - 3.1 x10E3/uL   Monocytes Absolute 0.4 0.1 - 0.9 x10E3/uL   EOS (ABSOLUTE) 0.3 0.0 - 0.4 x10E3/uL   Basophils Absolute 0.1 0.0 - 0.2 x10E3/uL   Immature Granulocytes 0 Not Estab. %   Immature Grans (Abs) 0.0 0.0 - 0.1 x10E3/uL  CMP14+EGFR  Result Value Ref Range   Glucose 152 (H) 65 - 99 mg/dL   BUN 13 6 - 24 mg/dL   Creatinine, Ser 0.67 0.57 - 1.00 mg/dL   GFR calc non Af Amer 104 >59 mL/min/1.73   GFR calc Af Amer 119 >59 mL/min/1.73   BUN/Creatinine Ratio 19 9 - 23   Sodium 142 134 - 144 mmol/L   Potassium 4.3 3.5 - 5.2 mmol/L   Chloride 102 96 - 106 mmol/L   CO2 22 20 - 29 mmol/L   Calcium 9.4 8.7 - 10.2 mg/dL   Total Protein  7.0 6.0 - 8.5 g/dL   Albumin 4.1 3.8 - 4.8 g/dL   Globulin, Total 2.9 1.5 - 4.5 g/dL   Albumin/Globulin Ratio 1.4 1.2 - 2.2   Bilirubin Total <0.2 0.0 - 1.2 mg/dL   Alkaline Phosphatase 64 48 - 121 IU/L   AST 13 0 - 40 IU/L   ALT 17 0 - 32 IU/L  Lipid panel  Result Value Ref Range   Cholesterol, Total 127 100 - 199 mg/dL   Triglycerides 246 (H) 0 - 149 mg/dL   HDL 36 (L) >39 mg/dL   VLDL Cholesterol Cal 39 5 - 40 mg/dL   LDL Chol Calc (NIH) 52 0 - 99 mg/dL   Chol/HDL Ratio 3.5 0.0 - 4.4 ratio      Assessment & Plan:   Myriam was seen today for follow-up.  Diagnoses and all orders for this visit:  Type 2 diabetes mellitus with diabetic polyneuropathy, without long-term current use of insulin ([place])  Mixed hyperlipidemia   I have discontinued Sharni Aung "Nickie"'s amoxicillin-clavulanate. I am also having her maintain her Lancets, glucose blood, lidocaine, Narcan, diclofenac sodium, cyanocobalamin, dicyclomine, baclofen, diclofenac, ezetimibe, fenofibrate, omeprazole, lamoTRIgine, hydrOXYzine, citalopram, Biotin, Calcium Carb-Cholecalciferol (CALCIUM 600 + D PO), Fish Oil, amLODipine, atorvastatin, aspirin, fluconazole, and Jardiance.     Follow-up: Return in about 3 months (around 05/15/2020).  Claretta Fraise, M.D.

## 2020-02-24 ENCOUNTER — Ambulatory Visit (INDEPENDENT_AMBULATORY_CARE_PROVIDER_SITE_OTHER): Payer: BC Managed Care – PPO | Admitting: Obstetrics and Gynecology

## 2020-02-24 ENCOUNTER — Encounter: Payer: Self-pay | Admitting: Obstetrics and Gynecology

## 2020-02-24 VITALS — BP 113/81 | HR 80 | Ht 68.0 in | Wt 194.0 lb

## 2020-02-24 DIAGNOSIS — L732 Hidradenitis suppurativa: Secondary | ICD-10-CM

## 2020-02-24 DIAGNOSIS — Z09 Encounter for follow-up examination after completed treatment for conditions other than malignant neoplasm: Secondary | ICD-10-CM

## 2020-02-24 NOTE — Progress Notes (Signed)
**Note Aimee-Identified via Obfuscation** Patient ID: Aimee [name], female   DOB: [birth year], 49 y.o.   MRN: [id number]   Subjective:  Aimee [name] is a 49 y.o. female now 4 weeks status post excision of labial vulvar sebaceous cysts. 3 cysts were removed.  Apparently there was a fourth that was the one in the mid area that was supposed to be removed , but at the time of surgery there was no palpable abnormality and no expressible d/c from that area. She reports that she has an additional lesion that has shown drainage, the one that has been intermittently activeTwice. It is located on the left side, at mid vagina, in the crease between labia minora and majora at 3 oclock. The patient works as an Optometrist.  Review of Systems Negative except one of the lesions has popped open twice.   Diet: negative   Bowel movements: normal.  The patient is not having any pain.  Objective:  There were no vitals taken for this visit. General:Well developed, well nourished.  No acute distress. Abdomen: Bowel sounds normal, soft, non-tender. Pelvic Exam:    External Genitalia: incision at left mons pubis is healing well,  Small incision at left panty line is healed Third incision  Incision(s): One lesion is 85% healed. Re-epithelialized over 3/4 of the site of the inferior lesion on the left, lateral to the posterior fourchette.  Assessment:  Post-Op 4 weeks s/p excision of labial vulvar sebaceous cysts.which are healing more consistent with Hidradenitis than a simple infected sebaceous cyst that was infected.Excellent healing at mons pubis site.  Drainage from a tiny pinhole at the crease on the left between the left labia majora and labia minora.It has recurred twice since the excision of the other sites.  Doing moderately well postoperatively at other sites. The posterior left site has broken open and is almost completely healed by secondary intention.   Plan:  Wound care discussed. Keep site covered with diaper cream or neosporin. 2.    Current medications unchanged. 3.   Activity restrictions: none 4.   Return to work: now. 5.   Follow up by phone on later this week. I will speak to insurance person Carloyn Lahue Giovanni about how to go ahead with removing the lesion that was left behind from previous surgery.  By signing my name below, I, Aimee [name], attest that this documentation has been prepared under the direction and in the presence of Jonnie Kind, MD. Electronically Signed: De [name], Medical Scribe. 02/24/20. 11:00 AM.  I personally performed the services described in this documentation, which was SCRIBED in my presence. The recorded information has been reviewed and considered accurate. It has been edited as necessary during review. Jonnie Kind, MD

## 2020-02-26 ENCOUNTER — Other Ambulatory Visit: Payer: Self-pay

## 2020-02-26 ENCOUNTER — Ambulatory Visit (HOSPITAL_COMMUNITY): Payer: BC Managed Care – PPO | Admitting: Psychiatry

## 2020-02-28 ENCOUNTER — Other Ambulatory Visit: Payer: Self-pay | Admitting: Family Medicine

## 2020-02-28 DIAGNOSIS — E1142 Type 2 diabetes mellitus with diabetic polyneuropathy: Secondary | ICD-10-CM

## 2020-02-28 DIAGNOSIS — E1165 Type 2 diabetes mellitus with hyperglycemia: Secondary | ICD-10-CM

## 2020-03-11 ENCOUNTER — Encounter: Payer: Self-pay | Admitting: Family Medicine

## 2020-03-12 ENCOUNTER — Other Ambulatory Visit (HOSPITAL_COMMUNITY): Payer: Self-pay | Admitting: *Deleted

## 2020-03-12 ENCOUNTER — Telehealth (HOSPITAL_COMMUNITY): Payer: Self-pay | Admitting: *Deleted

## 2020-03-12 ENCOUNTER — Other Ambulatory Visit: Payer: Self-pay | Admitting: Family Medicine

## 2020-03-12 DIAGNOSIS — F4310 Post-traumatic stress disorder, unspecified: Secondary | ICD-10-CM

## 2020-03-12 DIAGNOSIS — F319 Bipolar disorder, unspecified: Secondary | ICD-10-CM

## 2020-03-12 MED ORDER — LAMOTRIGINE 100 MG PO TABS: 100.0000 mg | ORAL_TABLET | Freq: Every day | ORAL | 0 refills | Status: AC

## 2020-03-12 MED ORDER — HYDROXYZINE PAMOATE 50 MG PO CAPS: ORAL_CAPSULE | ORAL | 0 refills | Status: AC

## 2020-03-12 MED ORDER — CITALOPRAM HYDROBROMIDE 40 MG PO TABS: 40.0000 mg | ORAL_TABLET | Freq: Every day | ORAL | 0 refills | Status: DC

## 2020-03-12 MED ORDER — GABAPENTIN 600 MG PO TABS: ORAL_TABLET | ORAL | 1 refills | Status: AC

## 2020-03-12 NOTE — Telephone Encounter (Signed)
Please provide a 90-day prescription with instruction that she has to keep appointment for future refills.

## 2020-03-12 NOTE — Telephone Encounter (Signed)
Pt called asking for refills on all meds and could they be filled foor 90 days? Pt missed her last appt. And has no future ones in place she admits. Pt now has an appointment on the books for 7/28. Please review and advise.

## 2020-03-17 ENCOUNTER — Other Ambulatory Visit: Payer: Self-pay | Admitting: *Deleted

## 2020-03-17 DIAGNOSIS — E1142 Type 2 diabetes mellitus with diabetic polyneuropathy: Secondary | ICD-10-CM

## 2020-03-17 DIAGNOSIS — E1165 Type 2 diabetes mellitus with hyperglycemia: Secondary | ICD-10-CM

## 2020-03-17 MED ORDER — EMPAGLIFLOZIN 10 MG PO TABS
ORAL_TABLET | ORAL | 0 refills | Status: AC
Start: 2020-03-17 — End: ?

## 2020-03-23 ENCOUNTER — Telehealth: Payer: Self-pay | Admitting: *Deleted

## 2020-03-23 NOTE — Telephone Encounter (Signed)
ZETIA PA CAME IN TODAY Key: [id number]  ID was not found - pt was called and she states that she no longer has any insurance - was stopped last Friday.  She will update [place] once she has new coverage.  I have faxed request back to pharm [facility] to let them know she will not be filling this med currently , nor do we need to do the PA

## 2020-04-08 ENCOUNTER — Telehealth (HOSPITAL_COMMUNITY): Payer: BC Managed Care – PPO | Admitting: Psychiatry

## 2020-04-10 ENCOUNTER — Encounter: Payer: Self-pay | Admitting: Family Medicine

## 2020-04-20 NOTE — Telephone Encounter (Signed)
Please do a handicap sticker for Ms. Branca & I will sign. Thanks!

## 2020-04-21 ENCOUNTER — Other Ambulatory Visit: Payer: Self-pay | Admitting: Adult Health

## 2020-04-21 DIAGNOSIS — Z0289 Encounter for other administrative examinations: Secondary | ICD-10-CM

## 2020-04-21 MED ORDER — SULFAMETHOXAZOLE-TRIMETHOPRIM 800-160 MG PO TABS
1.0000 | ORAL_TABLET | Freq: Two times a day (BID) | ORAL | 0 refills | Status: DC
Start: 2020-04-21 — End: 2020-05-07

## 2020-04-21 NOTE — Progress Notes (Signed)
Will rx septra ds

## 2020-04-22 ENCOUNTER — Other Ambulatory Visit: Payer: Self-pay | Admitting: Family Medicine

## 2020-04-22 DIAGNOSIS — I700 Atherosclerosis of aorta: Secondary | ICD-10-CM

## 2020-04-23 ENCOUNTER — Encounter: Payer: Self-pay | Admitting: Family Medicine

## 2020-04-27 ENCOUNTER — Other Ambulatory Visit: Payer: Self-pay | Admitting: Obstetrics and Gynecology

## 2020-04-28 ENCOUNTER — Other Ambulatory Visit: Payer: Self-pay | Admitting: Family Medicine

## 2020-04-28 DIAGNOSIS — F4310 Post-traumatic stress disorder, unspecified: Secondary | ICD-10-CM

## 2020-05-04 ENCOUNTER — Encounter: Payer: Self-pay | Admitting: Obstetrics & Gynecology

## 2020-05-04 ENCOUNTER — Ambulatory Visit: Payer: BLUE CROSS/BLUE SHIELD | Admitting: Obstetrics & Gynecology

## 2020-05-04 VITALS — BP 125/84 | HR 80 | Ht 68.0 in | Wt 198.0 lb

## 2020-05-04 DIAGNOSIS — L723 Sebaceous cyst: Secondary | ICD-10-CM | POA: Diagnosis not present

## 2020-05-04 MED ORDER — SILVER SULFADIAZINE 1 % EX CREA
TOPICAL_CREAM | CUTANEOUS | 11 refills | Status: DC
Start: 2020-05-04 — End: 2021-11-03

## 2020-05-04 NOTE — Progress Notes (Signed)
Sebaceous Cyst Excision Procedure Note  Pre-operative Diagnosis: Sebaceous cyst of the left vulva  Post-operative Diagnosis: same  Locations:left labia minor   Indications: draining tender  Anesthesia: bupivicaine plain 0.5%   Procedure Details  History of allergy to iodine: no  Patient informed of the risks (including bleeding and infection) and benefits of the  procedure and Written informed consent obtained.  The lesion and surrounding area was given a sterile prep using betadyne and draped in the usual sterile fashion. An incision was made over the cyst, which was dissected free of the surrounding tissue and removed.  The cyst was filled with typical sebaceous material.  The wound was closed with 4-0 Vicryl using interuupted simple ann D figure of eight. Antibiotic ointment and a sterile dressing applied.  The specimen was sent for pathologic examination. The patient tolerated the procedure well.  EBL: 25 ml  Findings: Normal tissue  Condition: Stable  Complications: swelling.  Plan: 1. Instructed to keep the wound dry and covered for 24-48h and clean thereafter. 2. Warning signs of infection were reviewed.   3. Recommended that the patient use OTC analgesics as needed for pain.  4. Return for suture removal in 8 days .

## 2020-05-04 NOTE — Addendum Note (Signed)
Addended by: Janece Canterbury on: 05/04/2020 04:03 PM   Modules accepted: Orders

## 2020-05-06 ENCOUNTER — Telehealth: Payer: Self-pay | Admitting: Obstetrics & Gynecology

## 2020-05-06 NOTE — Telephone Encounter (Signed)
Patient will like a call back about the procedure she had in the office the other day.

## 2020-05-06 NOTE — Telephone Encounter (Signed)
Patient called stating that she is worried about the surgical site from Monday. Patient reports that the area has grown to 2-3 times the size it was Monday night. It is extremely painful and swelling. Advised patient that we close at 5 but she may want to go to urgent care or emergency room. Pt states that she would rather see Dr. Elonda Husky. I scheduled her to see him tomorrow morning. Pt advised to keep ice on the area. No other questions or concerns.

## 2020-05-07 ENCOUNTER — Encounter: Payer: Self-pay | Admitting: Obstetrics & Gynecology

## 2020-05-07 ENCOUNTER — Ambulatory Visit (INDEPENDENT_AMBULATORY_CARE_PROVIDER_SITE_OTHER): Payer: BLUE CROSS/BLUE SHIELD | Admitting: Obstetrics & Gynecology

## 2020-05-07 VITALS — BP 119/79 | HR 99 | Wt 198.0 lb

## 2020-05-07 DIAGNOSIS — Z9889 Other specified postprocedural states: Secondary | ICD-10-CM

## 2020-05-07 MED ORDER — LIDOCAINE HCL URETHRAL/MUCOSAL 2 % EX GEL: 1.0000 "application " | CUTANEOUS | 1 refills | Status: DC | PRN

## 2020-05-07 MED ORDER — KETOROLAC TROMETHAMINE 10 MG PO TABS
10.0000 mg | ORAL_TABLET | Freq: Three times a day (TID) | ORAL | 0 refills | Status: DC | PRN
Start: 2020-05-07 — End: 2020-05-21

## 2020-05-07 NOTE — Progress Notes (Signed)
HPI: Patient returns for routine postoperative follow-up having undergone sebaceous cyst excision  on 8/23.  The patient's immediate postoperative recovery has been unremarkable. Since hospital discharge the patient reports swelling bruising.   Current Outpatient Medications: amLODipine (NORVASC) 5 MG tablet, Take 1 tablet (5 mg total) by mouth daily., Disp: 90 tablet, Rfl: 0 aspirin 81 MG EC tablet, TAKE 1 TABLET BY MOUTH EVERY DAY, Disp: 90 tablet, Rfl: 0 atorvastatin (LIPITOR) 40 MG tablet, Take 1 tablet (40 mg total) by mouth daily., Disp: 90 tablet, Rfl: 0 baclofen (LIORESAL) 10 MG tablet, Take 1 tablet (10 mg total) by mouth 3 (three) times daily., Disp: 270 tablet, Rfl: 1 Biotin 10000 MCG TABS, Take 10,000 mcg by mouth daily., Disp: , Rfl:  Calcium Carb-Cholecalciferol (CALCIUM 600 + D PO), Take 1 tablet by mouth daily., Disp: , Rfl:  ciprofloxacin (CILOXAN) 0.3 % ophthalmic solution, SMARTSIG:In Ear(s), Disp: , Rfl:  citalopram (CELEXA) 40 MG tablet, Take 1 tablet (40 mg total) by mouth daily. (Needs to be seen before next refill), Disp: 90 tablet, Rfl: 0 cyanocobalamin (,VITAMIN B-12,) 1000 MCG/ML injection, INJECT 1 ML (1,000 MCG TOTAL) INTO THE MUSCLE EVERY 30 (THIRTY) DAYS., Disp: 3 mL, Rfl: 3 diclofenac (VOLTAREN) 75 MG EC tablet, TAKE 1 TABLET TWICE A DAY (Patient taking differently: Take 75 mg by mouth 2 (two) times daily. ), Disp: 180 tablet, Rfl: 0 diclofenac sodium (VOLTAREN) 1 % GEL, Apply 4 g topically 4 (four) times daily. As needed for neck and back pain, Disp: 100 g, Rfl: 1 dicyclomine (BENTYL) 10 MG capsule, TAKE ONE CAPSULE THREE TIMES A DAY AS NEEDED FOR ABDOMINAL CRAMPING AND DIARRHEA (Patient taking differently: Take 10 mg by mouth 3 (three) times daily as needed for spasms (and diarrhea). ), Disp: 270 capsule, Rfl: 1 empagliflozin (JARDIANCE) 10 MG TABS tablet, TAKE 1 TABLET BY MOUTH ONCE DAILY TO CONTROL DIABETES, Disp: 90 tablet, Rfl: 0 ezetimibe (ZETIA) 10 MG  tablet, Take 1 tablet (10 mg total) by mouth daily., Disp: 90 tablet, Rfl: 1 fenofibrate 160 MG tablet, Take 1 tablet (160 mg total) by mouth daily. For cholesterol and triglyceride, Disp: 90 tablet, Rfl: 1 gabapentin (NEURONTIN) 600 MG tablet, Take 1 each morning two each afternoon and 3 at bedtime, Disp: 540 tablet, Rfl: 1 glucose blood test strip, Use as instructed, Disp: 200 each, Rfl: 3 hydrOXYzine (VISTARIL) 50 MG capsule, Take one in am for anxiety and two at bed time., Disp: 270 capsule, Rfl: 0 lamoTRIgine (LAMICTAL) 100 MG tablet, Take 1 tablet (100 mg total) by mouth daily., Disp: 90 tablet, Rfl: 0 Lancets MISC, Use to check blood sugars twice daily, Disp: 200 each, Rfl: 3 lidocaine (LIDODERM) 5 %, PLACE 1 PATCH ONTO THE SKIN DAILY. REMOVE & DISCARD PATCH WITHIN 12 HOURS OR AS DIRECTED BY MD (Patient taking differently: Place 1 patch onto the skin daily as needed (pain). ), Disp: 30 patch, Rfl: 5 Omega-3 Fatty Acids (FISH OIL) 1200 MG CAPS, Take 1,200 mg by mouth in the morning and at bedtime., Disp: , Rfl:  omeprazole (PRILOSEC) 20 MG capsule, Take 1 capsule (20 mg total) by mouth daily., Disp: 90 capsule, Rfl: 1 silver sulfADIAZINE (SILVADENE) 1 % cream, Use to area 3-4 times daily, Disp: 50 g, Rfl: 11 ketorolac (TORADOL) 10 MG tablet, Take 1 tablet (10 mg total) by mouth every 8 (eight) hours as needed., Disp: 15 tablet, Rfl: 0 lidocaine (XYLOCAINE) 2 % jelly, Apply 1 application topically as needed., Disp: 15 mL, Rfl: 1 NARCAN 4 MG/0.1ML  LIQD nasal spray kit, Place 0.4 mg into the nose once.  (Patient not taking: Reported on 05/07/2020), Disp: , Rfl: 0  No current facility-administered medications for this visit.    Blood pressure 119/79, pulse 99, weight 198 lb (89.8 kg).  Physical Exam: Typical post vulvectomy w/brsuising hematoma formation no infection  Diagnostic Tests:   Pathology:   Impression: S/p partial vulvaectomy for sebaceous cyst excision  Plan: Meds  ordered this encounter  Medications  . ketorolac (TORADOL) 10 MG tablet    Sig: Take 1 tablet (10 mg total) by mouth every 8 (eight) hours as needed.    Dispense:  15 tablet    Refill:  0  . lidocaine (XYLOCAINE) 2 % jelly    Sig: Apply 1 application topically as needed.    Dispense:  15 mL    Refill:  1     Follow up: Keep scheduled    Florian Buff, MD

## 2020-05-12 ENCOUNTER — Encounter: Payer: Self-pay | Admitting: Obstetrics & Gynecology

## 2020-05-12 ENCOUNTER — Other Ambulatory Visit: Payer: Self-pay | Admitting: Obstetrics & Gynecology

## 2020-05-12 ENCOUNTER — Ambulatory Visit (INDEPENDENT_AMBULATORY_CARE_PROVIDER_SITE_OTHER): Payer: BLUE CROSS/BLUE SHIELD | Admitting: Obstetrics & Gynecology

## 2020-05-12 VITALS — BP 133/98 | HR 94 | Ht 68.0 in | Wt 199.0 lb

## 2020-05-12 DIAGNOSIS — Z09 Encounter for follow-up examination after completed treatment for conditions other than malignant neoplasm: Secondary | ICD-10-CM

## 2020-05-12 MED ORDER — FLUCONAZOLE 150 MG PO TABS
ORAL_TABLET | ORAL | 1 refills | Status: DC
Start: 2020-05-12 — End: 2021-11-03

## 2020-05-12 MED ORDER — CLINDAMYCIN HCL 300 MG PO CAPS
300.0000 mg | ORAL_CAPSULE | Freq: Three times a day (TID) | ORAL | 0 refills | Status: DC
Start: 2020-05-12 — End: 2021-11-03

## 2020-05-12 NOTE — Progress Notes (Signed)
HPI: Patient returns for routine postoperative follow-up having undergone excision of vulvar sebaceous cyst on 05/04/20.  The patient's immediate postoperative recovery has been unremarkable. Since hospital discharge the patient reports improving symtpoms.   Current Outpatient Medications: amLODipine (NORVASC) 5 MG tablet, Take 1 tablet (5 mg total) by mouth daily., Disp: 90 tablet, Rfl: 0 aspirin 81 MG EC tablet, TAKE 1 TABLET BY MOUTH EVERY DAY, Disp: 90 tablet, Rfl: 0 atorvastatin (LIPITOR) 40 MG tablet, Take 1 tablet (40 mg total) by mouth daily., Disp: 90 tablet, Rfl: 0 baclofen (LIORESAL) 10 MG tablet, Take 1 tablet (10 mg total) by mouth 3 (three) times daily., Disp: 270 tablet, Rfl: 1 Biotin 10000 MCG TABS, Take 10,000 mcg by mouth daily., Disp: , Rfl:  Calcium Carb-Cholecalciferol (CALCIUM 600 + D PO), Take 1 tablet by mouth daily., Disp: , Rfl:  ciprofloxacin (CILOXAN) 0.3 % ophthalmic solution, SMARTSIG:In Ear(s), Disp: , Rfl:  cyanocobalamin (,VITAMIN B-12,) 1000 MCG/ML injection, INJECT 1 ML (1,000 MCG TOTAL) INTO THE MUSCLE EVERY 30 (THIRTY) DAYS., Disp: 3 mL, Rfl: 3 diclofenac (VOLTAREN) 75 MG EC tablet, TAKE 1 TABLET TWICE A DAY, Disp: 180 tablet, Rfl: 0 diclofenac sodium (VOLTAREN) 1 % GEL, Apply 4 g topically 4 (four) times daily. As needed for neck and back pain, Disp: 100 g, Rfl: 1 dicyclomine (BENTYL) 10 MG capsule, TAKE ONE CAPSULE THREE TIMES A DAY AS NEEDED FOR ABDOMINAL CRAMPING AND DIARRHEA (Patient taking differently: Take 10 mg by mouth 3 (three) times daily as needed for spasms (and diarrhea). ), Disp: 270 capsule, Rfl: 1 empagliflozin (JARDIANCE) 10 MG TABS tablet, TAKE 1 TABLET BY MOUTH ONCE DAILY TO CONTROL DIABETES, Disp: 90 tablet, Rfl: 0 ezetimibe (ZETIA) 10 MG tablet, Take 1 tablet (10 mg total) by mouth daily., Disp: 90 tablet, Rfl: 1 fenofibrate 160 MG tablet, Take 1 tablet (160 mg total) by mouth daily. For cholesterol and triglyceride, Disp: 90 tablet,  Rfl: 1 gabapentin (NEURONTIN) 600 MG tablet, Take 1 each morning two each afternoon and 3 at bedtime, Disp: 540 tablet, Rfl: 1 glucose blood test strip, Use as instructed, Disp: 200 each, Rfl: 3 hydrOXYzine (VISTARIL) 50 MG capsule, Take one in am for anxiety and two at bed time., Disp: 270 capsule, Rfl: 0 ketorolac (TORADOL) 10 MG tablet, Take 1 tablet (10 mg total) by mouth every 8 (eight) hours as needed., Disp: 15 tablet, Rfl: 0 lamoTRIgine (LAMICTAL) 100 MG tablet, Take 1 tablet (100 mg total) by mouth daily., Disp: 90 tablet, Rfl: 0 Lancets MISC, Use to check blood sugars twice daily, Disp: 200 each, Rfl: 3 NARCAN 4 MG/0.1ML LIQD nasal spray kit, Place 0.4 mg into the nose once. , Disp: , Rfl: 0 Omega-3 Fatty Acids (FISH OIL) 1200 MG CAPS, Take 1,200 mg by mouth in the morning and at bedtime., Disp: , Rfl:  omeprazole (PRILOSEC) 20 MG capsule, Take 1 capsule (20 mg total) by mouth daily., Disp: 90 capsule, Rfl: 1 silver sulfADIAZINE (SILVADENE) 1 % cream, Use to area 3-4 times daily, Disp: 50 g, Rfl: 11 clindamycin (CLEOCIN) 300 MG capsule, Take 1 capsule (300 mg total) by mouth 3 (three) times daily. (Patient not taking: Reported on 05/12/2020), Disp: 30 capsule, Rfl: 0 lidocaine (XYLOCAINE) 2 % jelly, Apply 1 application topically as needed. (Patient not taking: Reported on 05/12/2020), Disp: 15 mL, Rfl: 1  No current facility-administered medications for this visit.    Blood pressure (!) 133/98, pulse 94, height 5' 8"  (1.727 m), weight 199 lb (90.3 kg).  Physical Exam:  Bruising and hematoma present local care given, will evacuate hematoma I the coming days  Diagnostic Tests:   Pathology:   Impression: S/P excision of sebaceous cyst in the left vulva, improving Local care reviewed  Meds ordered this encounter  Medications  . fluconazole (DIFLUCAN) 150 MG tablet    Sig: Take 1 tablet today and repeat in 3 days    Dispense:  2 tablet    Refill:  1  Cleocin 300 TID x 10  days   Plan:   Follow up: 1  weeks  Florian Buff, MD

## 2020-05-15 ENCOUNTER — Ambulatory Visit: Payer: BC Managed Care – PPO | Admitting: Family Medicine

## 2020-05-21 ENCOUNTER — Encounter: Payer: Self-pay | Admitting: Obstetrics & Gynecology

## 2020-05-21 ENCOUNTER — Ambulatory Visit (INDEPENDENT_AMBULATORY_CARE_PROVIDER_SITE_OTHER): Payer: BLUE CROSS/BLUE SHIELD | Admitting: Obstetrics & Gynecology

## 2020-05-21 VITALS — BP 123/85 | HR 90 | Ht 68.0 in | Wt 194.0 lb

## 2020-05-21 DIAGNOSIS — Z09 Encounter for follow-up examination after completed treatment for conditions other than malignant neoplasm: Secondary | ICD-10-CM

## 2020-05-21 NOTE — Progress Notes (Signed)
HPI: Patient returns for routine postoperative follow-up having undergone excision of vulvar sebaceous cyst on 05/04/20.  The patient's immediate postoperative recovery has been unremarkable. Since hospital discharge the patient reports improving symtpoms.   Current Outpatient Medications: amLODipine (NORVASC) 5 MG tablet, Take 1 tablet (5 mg total) by mouth daily., Disp: 90 tablet, Rfl: 0 aspirin 81 MG EC tablet, TAKE 1 TABLET BY MOUTH EVERY DAY, Disp: 90 tablet, Rfl: 0 atorvastatin (LIPITOR) 40 MG tablet, Take 1 tablet (40 mg total) by mouth daily., Disp: 90 tablet, Rfl: 0 baclofen (LIORESAL) 10 MG tablet, Take 1 tablet (10 mg total) by mouth 3 (three) times daily., Disp: 270 tablet, Rfl: 1 Biotin 10000 MCG TABS, Take 10,000 mcg by mouth daily., Disp: , Rfl:  Calcium Carb-Cholecalciferol (CALCIUM 600 + D PO), Take 1 tablet by mouth daily., Disp: , Rfl:  ciprofloxacin (CILOXAN) 0.3 % ophthalmic solution, SMARTSIG:In Ear(s), Disp: , Rfl:  cyanocobalamin (,VITAMIN B-12,) 1000 MCG/ML injection, INJECT 1 ML (1,000 MCG TOTAL) INTO THE MUSCLE EVERY 30 (THIRTY) DAYS., Disp: 3 mL, Rfl: 3 diclofenac (VOLTAREN) 75 MG EC tablet, TAKE 1 TABLET TWICE A DAY, Disp: 180 tablet, Rfl: 0 diclofenac sodium (VOLTAREN) 1 % GEL, Apply 4 g topically 4 (four) times daily. As needed for neck and back pain, Disp: 100 g, Rfl: 1 dicyclomine (BENTYL) 10 MG capsule, TAKE ONE CAPSULE THREE TIMES A DAY AS NEEDED FOR ABDOMINAL CRAMPING AND DIARRHEA (Patient taking differently: Take 10 mg by mouth 3 (three) times daily as needed for spasms (and diarrhea). ), Disp: 270 capsule, Rfl: 1 empagliflozin (JARDIANCE) 10 MG TABS tablet, TAKE 1 TABLET BY MOUTH ONCE DAILY TO CONTROL DIABETES, Disp: 90 tablet, Rfl: 0 ezetimibe (ZETIA) 10 MG tablet, Take 1 tablet (10 mg total) by mouth daily., Disp: 90 tablet, Rfl: 1 fenofibrate 160 MG tablet, Take 1 tablet (160 mg total) by mouth daily. For cholesterol and triglyceride, Disp: 90 tablet,  Rfl: 1 gabapentin (NEURONTIN) 600 MG tablet, Take 1 each morning two each afternoon and 3 at bedtime, Disp: 540 tablet, Rfl: 1 glucose blood test strip, Use as instructed, Disp: 200 each, Rfl: 3 hydrOXYzine (VISTARIL) 50 MG capsule, Take one in am for anxiety and two at bed time., Disp: 270 capsule, Rfl: 0 ketorolac (TORADOL) 10 MG tablet, Take 1 tablet (10 mg total) by mouth every 8 (eight) hours as needed., Disp: 15 tablet, Rfl: 0 lamoTRIgine (LAMICTAL) 100 MG tablet, Take 1 tablet (100 mg total) by mouth daily., Disp: 90 tablet, Rfl: 0 Lancets MISC, Use to check blood sugars twice daily, Disp: 200 each, Rfl: 3 NARCAN 4 MG/0.1ML LIQD nasal spray kit, Place 0.4 mg into the nose once. , Disp: , Rfl: 0 Omega-3 Fatty Acids (FISH OIL) 1200 MG CAPS, Take 1,200 mg by mouth in the morning and at bedtime., Disp: , Rfl:  omeprazole (PRILOSEC) 20 MG capsule, Take 1 capsule (20 mg total) by mouth daily., Disp: 90 capsule, Rfl: 1 silver sulfADIAZINE (SILVADENE) 1 % cream, Use to area 3-4 times daily, Disp: 50 g, Rfl: 11 clindamycin (CLEOCIN) 300 MG capsule, Take 1 capsule (300 mg total) by mouth 3 (three) times daily. (Patient not taking: Reported on 05/12/2020), Disp: 30 capsule, Rfl: 0 lidocaine (XYLOCAINE) 2 % jelly, Apply 1 application topically as needed. (Patient not taking: Reported on 05/12/2020), Disp: 15 mL, Rfl: 1  No current facility-administered medications for this visit.    Blood pressure (!) 133/98, pulse 94, height 5' 8"  (1.727 m), weight 199 lb (90.3 kg).  Physical Exam:  Bruising and hematoma present local care given, will evacuate hematoma I the coming days  Diagnostic Tests:   Pathology:   Impression: S/P excision of sebaceous cyst in the left vulva, improving Local care reviewed  No orders of the defined types were placed in this encounter.    Plan:   Follow up: prn  Florian Buff, MD

## 2020-07-14 ENCOUNTER — Other Ambulatory Visit: Payer: Self-pay | Admitting: Family Medicine

## 2020-07-14 ENCOUNTER — Other Ambulatory Visit (HOSPITAL_COMMUNITY): Payer: Self-pay | Admitting: Psychiatry

## 2020-07-14 DIAGNOSIS — I700 Atherosclerosis of aorta: Secondary | ICD-10-CM

## 2020-07-14 DIAGNOSIS — F4310 Post-traumatic stress disorder, unspecified: Secondary | ICD-10-CM

## 2020-08-10 ENCOUNTER — Other Ambulatory Visit (HOSPITAL_COMMUNITY): Payer: Self-pay | Admitting: Psychiatry

## 2020-08-10 DIAGNOSIS — F4310 Post-traumatic stress disorder, unspecified: Secondary | ICD-10-CM

## 2020-08-17 ENCOUNTER — Ambulatory Visit: Payer: BC Managed Care – PPO | Admitting: Family Medicine

## 2021-01-13 ENCOUNTER — Other Ambulatory Visit: Payer: Self-pay | Admitting: Family Medicine

## 2021-01-13 DIAGNOSIS — I700 Atherosclerosis of aorta: Secondary | ICD-10-CM

## 2021-01-26 ENCOUNTER — Other Ambulatory Visit: Payer: Self-pay | Admitting: Family Medicine

## 2021-01-26 DIAGNOSIS — I700 Atherosclerosis of aorta: Secondary | ICD-10-CM

## 2021-05-14 ENCOUNTER — Other Ambulatory Visit: Payer: Self-pay | Admitting: Family Medicine

## 2021-05-14 DIAGNOSIS — I700 Atherosclerosis of aorta: Secondary | ICD-10-CM

## 2021-07-27 ENCOUNTER — Other Ambulatory Visit: Payer: Self-pay | Admitting: Neurosurgery

## 2021-07-27 DIAGNOSIS — M542 Cervicalgia: Secondary | ICD-10-CM

## 2021-07-30 ENCOUNTER — Other Ambulatory Visit: Payer: BLUE CROSS/BLUE SHIELD

## 2021-08-01 ENCOUNTER — Other Ambulatory Visit: Payer: Self-pay

## 2021-08-01 ENCOUNTER — Ambulatory Visit
Admission: RE | Admit: 2021-08-01 | Discharge: 2021-08-01 | Disposition: A | Discharge status: Home / Self Care | Payer: Medicare (Managed Care) | Source: Ambulatory Visit | Attending: Neurosurgery | Admitting: Neurosurgery

## 2021-08-01 DIAGNOSIS — M542 Cervicalgia: Secondary | ICD-10-CM

## 2021-10-25 ENCOUNTER — Other Ambulatory Visit: Payer: Self-pay | Admitting: Neurosurgery

## 2021-11-08 NOTE — Pre-Procedure Instructions (Signed)
Surgical Instructions    Your procedure is scheduled on Friday, March 3rd.  Report to [facility] Main Entrance "A" at 9:45 A.M., then check in with the Admitting office.  Call this number if you have problems the morning of surgery:  [phone]   If you have any questions prior to your surgery date call [phone]: Open Monday-Friday 8am-4pm    Remember:  Do not eat or drink after midnight the night before your surgery    Take these medicines the morning of surgery with A SIP OF WATER  amLODipine (NORVASC) atorvastatin (LIPITOR)  baclofen (LIORESAL)  ezetimibe (ZETIA)  fenofibrate  gabapentin (NEURONTIN)  hydrOXYzine (VISTARIL)  lamoTRIgine (LAMICTAL)  omeprazole (PRILOSEC)  dicyclomine (BENTYL)-as needed  Follow your surgeon's instructions on when to stop Aspirin.  If no instructions were given by your surgeon then you will need to call the office to get those instructions.    As of today, STOP taking any Aleve, Naproxen, Ibuprofen, Motrin, Advil, Goody's, BC's, all herbal medications, fish oil, and all vitamins. This includes: diclofenac (VOLTAREN).  WHAT DO I DO ABOUT MY DIABETES MEDICATION?   Do not take empagliflozin (JARDIANCE) on Thursday, (3/2) or the morning of surgery (3/3).  The day of surgery, do not take other diabetes injectables, including OZEMPIC.   HOW TO MANAGE YOUR DIABETES BEFORE AND AFTER SURGERY  Why is it important to control my blood sugar before and after surgery? Improving blood sugar levels before and after surgery helps healing and can limit problems. A way of improving blood sugar control is eating a healthy diet by:  Eating less sugar and carbohydrates  Increasing activity/exercise  Talking with your doctor about reaching your blood sugar goals High blood sugars (greater than 180 mg/dL) can raise your risk of infections and slow your recovery, so you will need to focus on controlling your diabetes during the weeks before surgery. Make  sure that the doctor who takes care of your diabetes knows about your planned surgery including the date and location.  How do I manage my blood sugar before surgery? Check your blood sugar at least 4 times a day, starting 2 days before surgery, to make sure that the level is not too high or low.  Check your blood sugar the morning of your surgery when you wake up and every 2 hours until you get to the Short Stay unit.  If your blood sugar is less than 70 mg/dL, you will need to treat for low blood sugar: Do not take insulin. Treat a low blood sugar (less than 70 mg/dL) with  cup of clear juice (cranberry or apple), 4 glucose tablets, OR glucose gel. Recheck blood sugar in 15 minutes after treatment (to make sure it is greater than 70 mg/dL). If your blood sugar is not greater than 70 mg/dL on recheck, call [phone] for further instructions. Report your blood sugar to the short stay nurse when you get to Short Stay.  If you are admitted to the hospital after surgery: Your blood sugar will be checked by the staff and you will probably be given insulin after surgery (instead of oral diabetes medicines) to make sure you have good blood sugar levels. The goal for blood sugar control after surgery is 80-180 mg/dL.                      Do NOT Smoke (Tobacco/Vaping) for 24 hours prior to your procedure.  If you use a CPAP at night, you may  bring your mask/headgear for your overnight stay.   Contacts, glasses, piercing's, hearing aid's, dentures or partials may not be worn into surgery, please bring cases for these belongings.    For patients admitted to the hospital, discharge time will be determined by your treatment team.   Patients discharged the day of surgery will not be allowed to drive home, and someone needs to stay with them for 24 hours.  NO VISITORS WILL BE ALLOWED IN PRE-OP WHERE PATIENTS ARE PREPPED FOR SURGERY.  ONLY 1 SUPPORT PERSON MAY BE PRESENT IN THE WAITING ROOM WHILE  YOU ARE IN SURGERY.  IF YOU ARE TO BE ADMITTED, ONCE YOU ARE IN YOUR ROOM YOU WILL BE ALLOWED TWO (2) VISITORS. (1) VISITOR MAY STAY OVERNIGHT BUT MUST ARRIVE TO THE ROOM BY 8pm.  Minor children may have two parents present. Special consideration for safety and communication needs will be reviewed on a case by case basis.   Special instructions:   [place]- Preparing For Surgery  Before surgery, you can play an important role. Because skin is not sterile, your skin needs to be as free of germs as possible. You can reduce the number of germs on your skin by washing with CHG (chlorahexidine gluconate) Soap before surgery.  CHG is an antiseptic cleaner which kills germs and bonds with the skin to continue killing germs even after washing.    Oral Hygiene is also important to reduce your risk of infection.  Remember - BRUSH YOUR TEETH THE MORNING OF SURGERY WITH YOUR REGULAR TOOTHPASTE  Please do not use if you have an allergy to CHG or antibacterial soaps. If your skin becomes reddened/irritated stop using the CHG.  Do not shave (including legs and underarms) for at least 48 hours prior to first CHG shower. It is OK to shave your face.  Please follow these instructions carefully.   Shower the NIGHT BEFORE SURGERY and the MORNING OF SURGERY  If you chose to wash your hair, wash your hair first as usual with your normal shampoo.  After you shampoo, rinse your hair and body thoroughly to remove the shampoo.  Use CHG Soap as you would any other liquid soap. You can apply CHG directly to the skin and wash gently with a scrungie or a clean washcloth.   Apply the CHG Soap to your body ONLY FROM THE NECK DOWN.  Do not use on open wounds or open sores. Avoid contact with your eyes, ears, mouth and genitals (private parts). Wash Face and genitals (private parts)  with your normal soap.   Wash thoroughly, paying special attention to the area where your surgery will be performed.  Thoroughly rinse  your body with warm water from the neck down.  DO NOT shower/wash with your normal soap after using and rinsing off the CHG Soap.  Pat yourself dry with a CLEAN TOWEL.  Wear CLEAN PAJAMAS to bed the night before surgery  Place CLEAN SHEETS on your bed the night before your surgery  DO NOT SLEEP WITH PETS.   Day of Surgery: Shower with CHG soap. Do not wear jewelry, make up, nail polish, gel polish, artificial nails, or any other type of covering on natural nails including finger and toenails. If patients have artificial nails, gel coating, etc. that need to be removed by a nail salon please have this removed prior to surgery. Surgery may need to be canceled/delayed if the surgeon/anesthesiologist feels like the patient is unable to be adequately monitored. Do not wear  lotions, powders, perfumes, or deodorant. Do not shave 48 hours prior to surgery.  Do not bring valuables to the hospital. [facility] is not responsible for any belongings or valuables. Wear Clean/Comfortable clothing the morning of surgery Remember to brush your teeth WITH YOUR REGULAR TOOTHPASTE.   Please read over the following fact sheets that you were given.   3 days prior to your procedure or After your COVID test   You are not required to quarantine however you are required to wear a well-fitting mask when you are out and around people not in your household. If your mask becomes wet or soiled, replace with a new one.   Wash your hands often with soap and water for 20 seconds or clean your hands with an alcohol-based hand sanitizer that contains at least 60% alcohol.   Do not share personal items.   Notify your provider:  o if you are in close contact with someone who has COVID  o or if you develop a fever of 100.4 or greater, sneezing, cough, sore throat, shortness of breath or body aches.

## 2021-11-09 ENCOUNTER — Encounter (HOSPITAL_COMMUNITY)
Admission: RE | Admit: 2021-11-09 | Discharge: 2021-11-09 | Disposition: A | Discharge status: Home / Self Care | Payer: Medicare (Managed Care) | Source: Ambulatory Visit | Attending: Neurosurgery | Admitting: Neurosurgery

## 2021-11-09 ENCOUNTER — Encounter (HOSPITAL_COMMUNITY): Payer: Self-pay

## 2021-11-09 ENCOUNTER — Other Ambulatory Visit: Payer: Self-pay

## 2021-11-09 VITALS — BP 113/94 | HR 90 | Temp 98.2°F | Resp 18 | Ht 68.0 in | Wt 185.3 lb

## 2021-11-09 DIAGNOSIS — Z01818 Encounter for other preprocedural examination: Secondary | ICD-10-CM | POA: Insufficient documentation

## 2021-11-09 DIAGNOSIS — K219 Gastro-esophageal reflux disease without esophagitis: Secondary | ICD-10-CM | POA: Insufficient documentation

## 2021-11-09 DIAGNOSIS — E781 Pure hyperglyceridemia: Secondary | ICD-10-CM | POA: Insufficient documentation

## 2021-11-09 DIAGNOSIS — E1140 Type 2 diabetes mellitus with diabetic neuropathy, unspecified: Secondary | ICD-10-CM | POA: Insufficient documentation

## 2021-11-09 DIAGNOSIS — F17200 Nicotine dependence, unspecified, uncomplicated: Secondary | ICD-10-CM | POA: Insufficient documentation

## 2021-11-09 DIAGNOSIS — M47819 Spondylosis without myelopathy or radiculopathy, site unspecified: Secondary | ICD-10-CM | POA: Insufficient documentation

## 2021-11-09 DIAGNOSIS — Z9621 Cochlear implant status: Secondary | ICD-10-CM | POA: Insufficient documentation

## 2021-11-09 DIAGNOSIS — H9191 Unspecified hearing loss, right ear: Secondary | ICD-10-CM | POA: Insufficient documentation

## 2021-11-09 DIAGNOSIS — M4802 Spinal stenosis, cervical region: Secondary | ICD-10-CM | POA: Insufficient documentation

## 2021-11-09 DIAGNOSIS — Z20822 Contact with and (suspected) exposure to covid-19: Secondary | ICD-10-CM | POA: Insufficient documentation

## 2021-11-09 DIAGNOSIS — Z7982 Long term (current) use of aspirin: Secondary | ICD-10-CM | POA: Insufficient documentation

## 2021-11-09 LAB — SURGICAL PCR SCREEN
MRSA, PCR: NEGATIVE
Staphylococcus aureus: NEGATIVE

## 2021-11-09 LAB — SARS CORONAVIRUS 2 BY RT PCR (HOSPITAL ORDER, PERFORMED IN ~~LOC~~ HOSPITAL LAB): SARS Coronavirus 2: NEGATIVE

## 2021-11-09 LAB — GLUCOSE, CAPILLARY: Glucose-Capillary: 129 mg/dL — ABNORMAL HIGH (ref 70–99)

## 2021-11-09 NOTE — Progress Notes (Signed)
PCP - Dr. Rayann Heman Cardiologist - denies  PPM/ICD - n/a  Chest x-ray - n/a EKG - 11/09/21 Stress Test - 15+ years ago.  ECHO - denies Cardiac Cath - denies  Sleep Study - denies CPAP - denies  Fasting Blood Sugar - does not check fasting levels Checks Blood Sugar 3-4 times a week at random times of the day. Mostly after a meal has been consumed. Last A1C-7.4 on 10/27/21. CBG in PAT 129   Blood Thinner Instructions: n/a Aspirin Instructions: LD 11/01/21  NPO at MD.  COVID TEST-11/09/21, done in PAT.  Anesthesia review: Yes, abnormal EKG. Unable to get a good reading because of spinal cord stimulator.   Patient denies shortness of breath, fever, cough and chest pain at PAT appointment   All instructions explained to the patient, with a verbal understanding of the material. Patient agrees to go over the instructions while at home for a better understanding. Patient also instructed to self quarantine after being tested for COVID-19. The opportunity to ask questions was provided.

## 2021-11-10 NOTE — Progress Notes (Signed)
Anesthesia Chart Review:  Case: [id number] Date/Time: 11/12/21 1130   Procedure: ACDF - C6-C7- removal of hardware C4-C6   Anesthesia type: General   Pre-op diagnosis: Stenosis   Location: [facility] OR ROOM 21 / [facility]   Surgeons: Kary Kos, MD       DISCUSSION: Patient is a 51 year old Aimee [name] scheduled for the above procedure.  History includes smoking, post-operative N/V, DM2 (with neuropathy), GERD, hypertriglyceridemia, spinal surgery (C4-6 ACDF 09/18/09; C3-4 ACDF ~ 2012; spinal cord implant 07/09/19), TAH/LSO (07/02/08), cholecystectomy (01/11/18), right hearing loss (cochlear implant).   A1c on 10/27/21 was 7.4% (Care Everywhere).  Last ASA 11/01/21.  11/09/2021 presurgical COVID-19 test negative.  Anesthesia team to evaluate on the day of surgery.   VS: BP (!) 113/94    Pulse 90    Temp 36.8 C (Oral)    Resp 18    Ht 5' 8"  (1.727 m)    Wt 84.1 kg    SpO2 99%    BMI 28.17 kg/m   PROVIDERS:  Lazoff, Shawn, DO is PCP    LABS: She had labs on 10/27/21 through [facility] including A1c, BMP, and CBC with diff. Results can be viewed in Care Everywhere and include: A1c 7.4%, sodium 140, potassium 4.3, BUN 18, glucose 129, creatinine 0.73, calcium 9.5, anion gap 7, WBC 9.6, hemoglobin 16.8, hematocrit 50.2, platelet count 274.  LFTs were normal by 07/26/2021 CMP.  PCP attributed elevated HGB to her smoking. Labs Reviewed  GLUCOSE, CAPILLARY - Abnormal; Notable for the following components:      Result Value   Glucose-Capillary 129 (*)    All other components within normal limits  SURGICAL PCR SCREEN  SARS CORONAVIRUS 2 BY RT PCR (HOSPITAL ORDER, [facility])   Spirometry > 5 years ago.    IMAGES: MRI C-spine 08/02/21: IMPRESSION: 1. Status post ACDF C3-C6 without spinal canal stenosis. Mild left neural foraminal narrowing at C3-C4 and mild right neural foraminal narrowing at C5-C6. 2. C2-C3 mild right neural foraminal narrowing. No spinal canal stenosis. 3.  Multilevel degenerative facet arthropathy, which can cause pain. IMPRESSION: 1. Status post ACDF C3-C6 without spinal canal stenosis. Mild left neural foraminal narrowing at C3-C4 and mild right neural foraminal narrowing at C5-C6. 2. C2-C3 mild right neural foraminal narrowing. No spinal canal stenosis. 3. Multilevel degenerative facet arthropathy, which can cause pain.   EKG: 11/09/21:  Normal sinus rhythm Left anterior fascicular block - She has had LAD or borderline LAD dating back to 02/02/15 EKG tracings. There is baseline artifact related to spinal cord stimulator.    CV: Reported stress test > 15 years ago.   Past Medical History:  Diagnosis Date   Arthritis    Atypical mole 10/31/2012   R neck-mod. ([facility])   Depression    Diabetes mellitus    type II    Family history of adverse reaction to anesthesia    mother- mother has lots of alleriges    GERD (gastroesophageal reflux disease)    Hidradenitis suppurativa    Hidradenitis suppurativa    Hypertension    Hypertriglyceridemia    Neuromuscular disorder ([place])    diabetic neuropathy    PONV (postoperative nausea and vomiting)    PTSD (post-traumatic stress disorder)    PTSD (post-traumatic stress disorder)     Past Surgical History:  Procedure Laterality Date   ABDOMINAL HYSTERECTOMY  2010   ABDOMINOPLASTY  2006   BREAST EXCISIONAL BIOPSY Right    [place]  x2   CHOLECYSTECTOMY N/A 01/11/2018   Procedure: LAPAROSCOPIC CHOLECYSTECTOMY;  Surgeon: [name], MD;  Location: [facility] ORS;  Service: General;  Laterality: N/A;  PT HAS PTSD AND IS AGGREGATED BY WAITING FOR LONG PERIODS OF TIME.   COCHLEAR IMPLANT Right    Bone Anchored Hearing Aid, x2   EXCISION VAGINAL CYST Left 01/23/2020   Procedure: EXCISION OF LABIAL VULVAR SEBCEOUS CYSTS;  Surgeon: Jonnie Kind, MD;  Location: [facility] ORS;  Service: Gynecology;  Laterality: Left;   INNER EAR SURGERY     12 surgeries on R ear starting in 1984;  implant 06/2011   left  carpal tunnel surgery      LIVER BIOPSY N/A 01/11/2018   Procedure: LIVER BIOPSY;  Surgeon: [name], MD;  Location: [facility] ORS;  Service: General;  Laterality: N/A;   SHOULDER ARTHROSCOPY Left 10/2015   SPINAL CORD STIMULATOR IMPLANT  2019   THORACIC [place] SURGERY  2011, 2012   x2   TONSILLECTOMY  12/26/2017   TUBAL LIGATION      MEDICATIONS:  amLODipine (NORVASC) 5 MG tablet   aspirin 81 MG EC tablet   atorvastatin (LIPITOR) 40 MG tablet   baclofen (LIORESAL) 10 MG tablet   Biotin 10000 MCG TABS   Calcium Carb-Cholecalciferol (CALCIUM 600 + D PO)   ciprofloxacin (CILOXAN) 0.3 % ophthalmic solution   diclofenac (VOLTAREN) 75 MG EC tablet   diclofenac sodium (VOLTAREN) 1 % GEL   dicyclomine (BENTYL) 10 MG capsule   empagliflozin (JARDIANCE) 10 MG TABS tablet   ezetimibe (ZETIA) 10 MG tablet   fenofibrate 160 MG tablet   gabapentin (NEURONTIN) 600 MG tablet   glucose blood test strip   hydrOXYzine (VISTARIL) 50 MG capsule   lamoTRIgine (LAMICTAL) 100 MG tablet   Lancets MISC   Omega-3 Fatty Acids (FISH OIL) 1200 MG CAPS   omeprazole (PRILOSEC) 20 MG capsule   OZEMPIC, 0.25 OR 0.5 MG/DOSE, 2 MG/1.5ML SOPN   No current facility-administered medications for this encounter.    Aimee Gianotti, PA-C Surgical Short Stay/Anesthesiology [facility] Phone [phone] [facility] Phone [phone] 11/10/2021 9:23 AM

## 2021-11-10 NOTE — Progress Notes (Signed)
Request for spouse to accompany patient to pre-op approved. ?

## 2021-11-10 NOTE — Anesthesia Preprocedure Evaluation (Addendum)
Anesthesia Evaluation  ?Patient identified by MRN, date of birth, ID band ?Patient awake ? ? ? ?Reviewed: ?Allergy & Precautions, NPO status , Patient's Chart, lab work & pertinent test results ? ?History of Anesthesia Complications ?(+) PONV and history of anesthetic complications ? ?Airway ?Mallampati: II ? ?TM Distance: >3 FB ?Neck ROM: Limited ? ? ? Dental ? ?(+) Teeth Intact, Dental Advisory Given ?  ?Pulmonary ?neg shortness of breath, neg COPD, neg recent URI, Current SmokerPatient did not abstain from smoking.,  ?  ?breath sounds clear to auscultation ? ? ? ? ? ? Cardiovascular ?hypertension, Pt. on medications ?(-) angina(-) Past MI and (-) CHF (-) dysrhythmias  ?Rhythm:Regular  ? ?  ?Neuro/Psych ?PSYCHIATRIC DISORDERS Anxiety Depression  Neuromuscular disease   ? GI/Hepatic ?Neg liver ROS, GERD  Medicated and Controlled,  ?Endo/Other  ?diabetes, Type 2Lab Results ?     Component                Value               Date                 ?     HGBA1C                   7.1 (H)             02/06/2020           ? ? Renal/GU ?negative Renal ROSLab Results ?     Component                Value               Date                 ?     WBC                      10.1                02/06/2020           ?     HGB                      15.6                02/06/2020           ?     HCT                      45.9                02/06/2020           ?     MCV                      93                  02/06/2020           ?     PLT                      294                 02/06/2020           ?  ? ?  ?Musculoskeletal ? ?(+) Arthritis , Fibromyalgia - ? Abdominal ?  ?Peds ?  Hematology ?Lab Results ?     Component                Value               Date                 ?     WBC                      10.1                02/06/2020           ?     HGB                      15.6                02/06/2020           ?     HCT                      45.9                02/06/2020           ?     MCV                       93                  02/06/2020           ?     PLT                      294                 02/06/2020           ?   ?Anesthesia Other Findings ? ? Reproductive/Obstetrics ? ?  ? ? ? ? ? ? ? ? ? ? ? ? ? ?  ?  ? ? ? ? ? ?Anesthesia Physical ?Anesthesia Plan ? ?ASA: 2 ? ?Anesthesia Plan: General  ? ?Post-op Pain Management: Ofirmev IV (intra-op)*  ? ?Induction: Intravenous ? ?PONV Risk Score and Plan: 3 and Ondansetron, Dexamethasone, Propofol infusion and Treatment may vary due to age or medical condition ? ?Airway Management Planned: Oral ETT ? ?Additional Equipment: None ? ?Intra-op Plan:  ? ?Post-operative Plan: Extubation in OR ? ?Informed Consent: I have reviewed the patients History and Physical, chart, labs and discussed the procedure including the risks, benefits and alternatives for the proposed anesthesia with the patient or authorized representative who has indicated his/her understanding and acceptance.  ? ? ? ?Dental advisory given ? ?Plan Discussed with: CRNA and Anesthesiologist ? ?Anesthesia Plan Comments: (PAT note written 11/10/2021 by Myra Gianotti, PA-C. ?)  ? ? ? ? ? ?Anesthesia Quick Evaluation ? ?

## 2021-11-12 ENCOUNTER — Inpatient Hospital Stay (HOSPITAL_COMMUNITY): Payer: Medicare (Managed Care) | Admitting: Vascular Surgery

## 2021-11-12 ENCOUNTER — Other Ambulatory Visit: Payer: Self-pay

## 2021-11-12 ENCOUNTER — Inpatient Hospital Stay (HOSPITAL_COMMUNITY)
Admission: RE | Disposition: A | Discharge status: Home / Self Care | Payer: Self-pay | Source: Home / Self Care | Attending: Neurosurgery

## 2021-11-12 ENCOUNTER — Inpatient Hospital Stay (HOSPITAL_COMMUNITY): Payer: Medicare (Managed Care)

## 2021-11-12 ENCOUNTER — Encounter (HOSPITAL_COMMUNITY): Payer: Self-pay | Admitting: Neurosurgery

## 2021-11-12 ENCOUNTER — Inpatient Hospital Stay (HOSPITAL_COMMUNITY)
Admission: RE | Admit: 2021-11-12 | Discharge: 2021-11-12 | DRG: 473 | Disposition: A | Discharge status: Home / Self Care | Payer: Medicare (Managed Care) | Attending: Neurosurgery | Admitting: Neurosurgery

## 2021-11-12 DIAGNOSIS — E781 Pure hyperglyceridemia: Secondary | ICD-10-CM | POA: Diagnosis present

## 2021-11-12 DIAGNOSIS — Z885 Allergy status to narcotic agent status: Secondary | ICD-10-CM

## 2021-11-12 DIAGNOSIS — Z888 Allergy status to other drugs, medicaments and biological substances status: Secondary | ICD-10-CM | POA: Diagnosis not present

## 2021-11-12 DIAGNOSIS — M4722 Other spondylosis with radiculopathy, cervical region: Principal | ICD-10-CM | POA: Diagnosis present

## 2021-11-12 DIAGNOSIS — Z8249 Family history of ischemic heart disease and other diseases of the circulatory system: Secondary | ICD-10-CM | POA: Diagnosis not present

## 2021-11-12 DIAGNOSIS — F17210 Nicotine dependence, cigarettes, uncomplicated: Secondary | ICD-10-CM | POA: Diagnosis present

## 2021-11-12 DIAGNOSIS — Z807 Family history of other malignant neoplasms of lymphoid, hematopoietic and related tissues: Secondary | ICD-10-CM

## 2021-11-12 DIAGNOSIS — I10 Essential (primary) hypertension: Secondary | ICD-10-CM | POA: Diagnosis present

## 2021-11-12 DIAGNOSIS — Z800 Family history of malignant neoplasm of digestive organs: Secondary | ICD-10-CM | POA: Diagnosis not present

## 2021-11-12 DIAGNOSIS — F32A Depression, unspecified: Secondary | ICD-10-CM | POA: Diagnosis present

## 2021-11-12 DIAGNOSIS — Z419 Encounter for procedure for purposes other than remedying health state, unspecified: Secondary | ICD-10-CM

## 2021-11-12 DIAGNOSIS — E1140 Type 2 diabetes mellitus with diabetic neuropathy, unspecified: Secondary | ICD-10-CM | POA: Diagnosis present

## 2021-11-12 DIAGNOSIS — Z803 Family history of malignant neoplasm of breast: Secondary | ICD-10-CM

## 2021-11-12 DIAGNOSIS — M4802 Spinal stenosis, cervical region: Secondary | ICD-10-CM | POA: Diagnosis present

## 2021-11-12 DIAGNOSIS — Z833 Family history of diabetes mellitus: Secondary | ICD-10-CM | POA: Diagnosis not present

## 2021-11-12 DIAGNOSIS — M532X2 Spinal instabilities, cervical region: Secondary | ICD-10-CM | POA: Diagnosis not present

## 2021-11-12 DIAGNOSIS — Z20822 Contact with and (suspected) exposure to covid-19: Secondary | ICD-10-CM | POA: Diagnosis present

## 2021-11-12 HISTORY — PX: ANTERIOR CERVICAL DECOMP/DISCECTOMY FUSION: SHX1161

## 2021-11-12 LAB — GLUCOSE, CAPILLARY
Glucose-Capillary: 112 mg/dL — ABNORMAL HIGH (ref 70–99)
Glucose-Capillary: 106 mg/dL — ABNORMAL HIGH (ref 70–99)
Glucose-Capillary: 135 mg/dL — ABNORMAL HIGH (ref 70–99)

## 2021-11-12 LAB — HEMOGLOBIN A1C
Hgb A1c MFr Bld: 7 % — ABNORMAL HIGH (ref 4.8–5.6)
Mean Plasma Glucose: 154.2 mg/dL

## 2021-11-12 SURGERY — ANTERIOR CERVICAL DECOMPRESSION/DISCECTOMY FUSION 1 LEVEL/HARDWARE REMOVAL
Anesthesia: General | Site: Spine Cervical

## 2021-11-12 MED ORDER — INSULIN ASPART 100 UNIT/ML IJ SOLN: 0.0000 [IU] | INTRAMUSCULAR | Status: DC | PRN

## 2021-11-12 MED ORDER — FENTANYL CITRATE (PF) 100 MCG/2ML IJ SOLN
INTRAMUSCULAR | Status: AC
  Filled 2021-11-12: qty 2

## 2021-11-12 MED ORDER — HEMOSTATIC AGENTS (NO CHARGE) OPTIME
TOPICAL | Status: DC | PRN
  Administered 2021-11-12: 1 via TOPICAL

## 2021-11-12 MED ORDER — ONDANSETRON HCL 4 MG/2ML IJ SOLN
INTRAMUSCULAR | Status: AC
  Filled 2021-11-12: qty 2

## 2021-11-12 MED ORDER — AMLODIPINE BESYLATE 5 MG PO TABS: 5.0000 mg | ORAL_TABLET | Freq: Every day | ORAL | Status: DC

## 2021-11-12 MED ORDER — LIDOCAINE 2% (20 MG/ML) 5 ML SYRINGE
INTRAMUSCULAR | Status: AC
  Filled 2021-11-12: qty 5

## 2021-11-12 MED ORDER — ORAL CARE MOUTH RINSE: 15.0000 mL | Freq: Once | OROMUCOSAL | Status: AC

## 2021-11-12 MED ORDER — ROCURONIUM BROMIDE 10 MG/ML (PF) SYRINGE
PREFILLED_SYRINGE | INTRAVENOUS | Status: DC | PRN
Start: 2021-11-12 — End: 2021-11-12
  Administered 2021-11-12: 80 mg via INTRAVENOUS

## 2021-11-12 MED ORDER — PROPOFOL 10 MG/ML IV BOLUS
INTRAVENOUS | Status: DC | PRN
  Administered 2021-11-12: 160 mg via INTRAVENOUS
  Administered 2021-11-12: 40 mg via INTRAVENOUS

## 2021-11-12 MED ORDER — ONDANSETRON HCL 4 MG/2ML IJ SOLN: 4.0000 mg | Freq: Four times a day (QID) | INTRAMUSCULAR | Status: DC | PRN

## 2021-11-12 MED ORDER — ASPIRIN EC 81 MG PO TBEC: 81.0000 mg | DELAYED_RELEASE_TABLET | Freq: Every day | ORAL | Status: DC

## 2021-11-12 MED ORDER — MIDAZOLAM HCL 2 MG/2ML IJ SOLN
INTRAMUSCULAR | Status: AC
  Filled 2021-11-12: qty 2

## 2021-11-12 MED ORDER — FENTANYL CITRATE (PF) 100 MCG/2ML IJ SOLN
25.0000 ug | INTRAMUSCULAR | Status: DC | PRN
  Administered 2021-11-12 (×2): 50 ug via INTRAVENOUS

## 2021-11-12 MED ORDER — LACTATED RINGERS IV SOLN: INTRAVENOUS | Status: DC

## 2021-11-12 MED ORDER — SUGAMMADEX SODIUM 200 MG/2ML IV SOLN
INTRAVENOUS | Status: DC | PRN
  Administered 2021-11-12: 200 mg via INTRAVENOUS

## 2021-11-12 MED ORDER — CEFAZOLIN SODIUM-DEXTROSE 2-4 GM/100ML-% IV SOLN
2.0000 g | INTRAVENOUS | Status: AC
  Administered 2021-11-12: 2 g via INTRAVENOUS
  Filled 2021-11-12: qty 100

## 2021-11-12 MED ORDER — DEXAMETHASONE SODIUM PHOSPHATE 10 MG/ML IJ SOLN
INTRAMUSCULAR | Status: AC
  Filled 2021-11-12: qty 1

## 2021-11-12 MED ORDER — KETOROLAC TROMETHAMINE 30 MG/ML IJ SOLN
INTRAMUSCULAR | Status: AC
  Filled 2021-11-12: qty 1

## 2021-11-12 MED ORDER — GELATIN ABSORBABLE MT POWD: OROMUCOSAL | Status: DC | PRN

## 2021-11-12 MED ORDER — DICYCLOMINE HCL 10 MG PO CAPS: 10.0000 mg | ORAL_CAPSULE | Freq: Three times a day (TID) | ORAL | Status: DC | PRN

## 2021-11-12 MED ORDER — SODIUM CHLORIDE 0.9 % IV SOLN
250.0000 mL | INTRAVENOUS | Status: DC
  Administered 2021-11-12: 250 mL via INTRAVENOUS

## 2021-11-12 MED ORDER — MIDAZOLAM HCL 5 MG/5ML IJ SOLN
INTRAMUSCULAR | Status: DC | PRN
  Administered 2021-11-12: 2 mg via INTRAVENOUS

## 2021-11-12 MED ORDER — ROCURONIUM BROMIDE 10 MG/ML (PF) SYRINGE
PREFILLED_SYRINGE | INTRAVENOUS | Status: AC
  Filled 2021-11-12: qty 10

## 2021-11-12 MED ORDER — CHLORHEXIDINE GLUCONATE CLOTH 2 % EX PADS: 6.0000 | MEDICATED_PAD | Freq: Once | CUTANEOUS | Status: DC

## 2021-11-12 MED ORDER — OMEGA-3-ACID ETHYL ESTERS 1 G PO CAPS: 1.0000 g | ORAL_CAPSULE | Freq: Every day | ORAL | Status: DC

## 2021-11-12 MED ORDER — ATORVASTATIN CALCIUM 40 MG PO TABS: 40.0000 mg | ORAL_TABLET | Freq: Every day | ORAL | Status: DC

## 2021-11-12 MED ORDER — EMPAGLIFLOZIN 10 MG PO TABS: 10.0000 mg | ORAL_TABLET | Freq: Every day | ORAL | Status: DC

## 2021-11-12 MED ORDER — 0.9 % SODIUM CHLORIDE (POUR BTL) OPTIME
TOPICAL | Status: DC | PRN
Start: 2021-11-12 — End: 2021-11-12
  Administered 2021-11-12: 1000 mL

## 2021-11-12 MED ORDER — ALBUMIN HUMAN 5 % IV SOLN
INTRAVENOUS | Status: DC | PRN
Start: 2021-11-12 — End: 2021-11-12

## 2021-11-12 MED ORDER — PHENOL 1.4 % MT LIQD: 1.0000 | OROMUCOSAL | Status: DC | PRN

## 2021-11-12 MED ORDER — PANTOPRAZOLE SODIUM 40 MG IV SOLR: 40.0000 mg | Freq: Every day | INTRAVENOUS | Status: DC

## 2021-11-12 MED ORDER — ONDANSETRON HCL 4 MG/2ML IJ SOLN
INTRAMUSCULAR | Status: DC | PRN
Start: 2021-11-12 — End: 2021-11-12
  Administered 2021-11-12: 4 mg via INTRAVENOUS

## 2021-11-12 MED ORDER — FENTANYL CITRATE (PF) 100 MCG/2ML IJ SOLN
25.0000 ug | INTRAMUSCULAR | Status: DC | PRN
  Administered 2021-11-12 (×3): 50 ug via INTRAVENOUS

## 2021-11-12 MED ORDER — ACETAMINOPHEN 10 MG/ML IV SOLN
INTRAVENOUS | Status: AC
  Filled 2021-11-12: qty 100

## 2021-11-12 MED ORDER — ALUM & MAG HYDROXIDE-SIMETH 200-200-20 MG/5ML PO SUSP: 30.0000 mL | Freq: Four times a day (QID) | ORAL | Status: DC | PRN

## 2021-11-12 MED ORDER — ACETAMINOPHEN 10 MG/ML IV SOLN: 1000.0000 mg | Freq: Once | INTRAVENOUS | Status: DC | PRN

## 2021-11-12 MED ORDER — GABAPENTIN 600 MG PO TABS: 600.0000 mg | ORAL_TABLET | Freq: Three times a day (TID) | ORAL | Status: DC

## 2021-11-12 MED ORDER — CIPROFLOXACIN HCL 0.3 % OP SOLN: 1.0000 [drp] | OPHTHALMIC | Status: DC

## 2021-11-12 MED ORDER — ONDANSETRON HCL 4 MG PO TABS: 4.0000 mg | ORAL_TABLET | Freq: Four times a day (QID) | ORAL | Status: DC | PRN

## 2021-11-12 MED ORDER — ACETAMINOPHEN 650 MG RE SUPP: 650.0000 mg | RECTAL | Status: DC | PRN

## 2021-11-12 MED ORDER — LIDOCAINE 2% (20 MG/ML) 5 ML SYRINGE
INTRAMUSCULAR | Status: DC | PRN
  Administered 2021-11-12: 60 mg via INTRAVENOUS

## 2021-11-12 MED ORDER — MENTHOL 3 MG MT LOZG: 1.0000 | LOZENGE | OROMUCOSAL | Status: DC | PRN

## 2021-11-12 MED ORDER — FENTANYL CITRATE (PF) 250 MCG/5ML IJ SOLN
INTRAMUSCULAR | Status: AC
  Filled 2021-11-12: qty 5

## 2021-11-12 MED ORDER — CYCLOBENZAPRINE HCL 10 MG PO TABS: 10.0000 mg | ORAL_TABLET | Freq: Three times a day (TID) | ORAL | Status: DC | PRN

## 2021-11-12 MED ORDER — LAMOTRIGINE 100 MG PO TABS
100.0000 mg | ORAL_TABLET | Freq: Every day | ORAL | Status: DC
  Filled 2021-11-12: qty 1

## 2021-11-12 MED ORDER — LORAZEPAM 2 MG/ML IJ SOLN: 0.5000 mg | Freq: Once | INTRAMUSCULAR | Status: AC

## 2021-11-12 MED ORDER — DEXAMETHASONE SODIUM PHOSPHATE 10 MG/ML IJ SOLN
INTRAMUSCULAR | Status: DC | PRN
  Administered 2021-11-12: 10 mg via INTRAVENOUS

## 2021-11-12 MED ORDER — CEFAZOLIN SODIUM-DEXTROSE 2-4 GM/100ML-% IV SOLN
2.0000 g | Freq: Three times a day (TID) | INTRAVENOUS | Status: DC
  Administered 2021-11-12: 2 g via INTRAVENOUS
  Filled 2021-11-12: qty 100

## 2021-11-12 MED ORDER — BUTALBITAL-APAP-CAFFEINE 50-325-40 MG PO TABS: 1.0000 | ORAL_TABLET | ORAL | Status: DC | PRN

## 2021-11-12 MED ORDER — SODIUM CHLORIDE 0.9% FLUSH: 3.0000 mL | Freq: Two times a day (BID) | INTRAVENOUS | Status: DC

## 2021-11-12 MED ORDER — BACLOFEN 10 MG PO TABS
10.0000 mg | ORAL_TABLET | Freq: Three times a day (TID) | ORAL | Status: DC
  Administered 2021-11-12: 10 mg via ORAL
  Filled 2021-11-12: qty 1

## 2021-11-12 MED ORDER — ACETAMINOPHEN 500 MG PO TABS: 1000.0000 mg | ORAL_TABLET | Freq: Once | ORAL | Status: DC | PRN

## 2021-11-12 MED ORDER — BIOTIN 10000 MCG PO TABS: 10000.0000 ug | ORAL_TABLET | Freq: Every day | ORAL | Status: DC

## 2021-11-12 MED ORDER — SEMAGLUTIDE(0.25 OR 0.5MG/DOS) 2 MG/1.5ML ~~LOC~~ SOPN: 0.2500 mg | PEN_INJECTOR | SUBCUTANEOUS | Status: DC

## 2021-11-12 MED ORDER — ACETAMINOPHEN 325 MG PO TABS: 650.0000 mg | ORAL_TABLET | ORAL | Status: DC | PRN

## 2021-11-12 MED ORDER — ACETAMINOPHEN 10 MG/ML IV SOLN
INTRAVENOUS | Status: DC | PRN
  Administered 2021-11-12: 1000 mg via INTRAVENOUS

## 2021-11-12 MED ORDER — PHENYLEPHRINE 40 MCG/ML (10ML) SYRINGE FOR IV PUSH (FOR BLOOD PRESSURE SUPPORT)
PREFILLED_SYRINGE | INTRAVENOUS | Status: DC | PRN
  Administered 2021-11-12 (×3): 80 ug via INTRAVENOUS

## 2021-11-12 MED ORDER — PROPOFOL 10 MG/ML IV BOLUS
INTRAVENOUS | Status: AC
  Filled 2021-11-12: qty 20

## 2021-11-12 MED ORDER — EZETIMIBE 10 MG PO TABS
10.0000 mg | ORAL_TABLET | Freq: Every day | ORAL | Status: DC
  Filled 2021-11-12: qty 1

## 2021-11-12 MED ORDER — FENTANYL CITRATE (PF) 250 MCG/5ML IJ SOLN
INTRAMUSCULAR | Status: DC | PRN
  Administered 2021-11-12: 50 ug via INTRAVENOUS
  Administered 2021-11-12: 150 ug via INTRAVENOUS

## 2021-11-12 MED ORDER — SODIUM CHLORIDE 0.9% FLUSH: 3.0000 mL | INTRAVENOUS | Status: DC | PRN

## 2021-11-12 MED ORDER — OXYCODONE-ACETAMINOPHEN 5-325 MG PO TABS: 1.0000 | ORAL_TABLET | ORAL | 0 refills | Status: DC | PRN

## 2021-11-12 MED ORDER — THROMBIN 5000 UNITS EX SOLR
CUTANEOUS | Status: AC
  Filled 2021-11-12: qty 15000

## 2021-11-12 MED ORDER — CHLORHEXIDINE GLUCONATE 0.12 % MT SOLN
15.0000 mL | Freq: Once | OROMUCOSAL | Status: AC
  Administered 2021-11-12: 15 mL via OROMUCOSAL
  Filled 2021-11-12: qty 15

## 2021-11-12 MED ORDER — LORAZEPAM 2 MG/ML IJ SOLN
INTRAMUSCULAR | Status: AC
  Administered 2021-11-12: 0.5 mg via INTRAVENOUS
  Filled 2021-11-12: qty 1

## 2021-11-12 MED ORDER — INSULIN ASPART 100 UNIT/ML IJ SOLN: 0.0000 [IU] | Freq: Three times a day (TID) | INTRAMUSCULAR | Status: DC

## 2021-11-12 MED ORDER — FENOFIBRATE 160 MG PO TABS: 160.0000 mg | ORAL_TABLET | Freq: Every day | ORAL | Status: DC

## 2021-11-12 MED ORDER — OYSTER SHELL CALCIUM/D3 500-5 MG-MCG PO TABS
ORAL_TABLET | Freq: Every day | ORAL | Status: DC
Start: 2021-11-13 — End: 2021-11-13

## 2021-11-12 MED ORDER — PANTOPRAZOLE SODIUM 40 MG PO TBEC: 40.0000 mg | DELAYED_RELEASE_TABLET | Freq: Every day | ORAL | Status: DC

## 2021-11-12 MED ORDER — ACETAMINOPHEN 160 MG/5ML PO SOLN: 1000.0000 mg | Freq: Once | ORAL | Status: DC | PRN

## 2021-11-12 MED ORDER — HYDROMORPHONE HCL 1 MG/ML IJ SOLN
0.5000 mg | INTRAMUSCULAR | Status: DC | PRN
  Administered 2021-11-12: 0.5 mg via INTRAVENOUS
  Filled 2021-11-12: qty 0.5

## 2021-11-12 MED ORDER — HYDROXYZINE HCL 50 MG PO TABS
50.0000 mg | ORAL_TABLET | Freq: Every day | ORAL | Status: DC
  Filled 2021-11-12: qty 1

## 2021-11-12 SURGICAL SUPPLY — 64 items
BAG COUNTER SPONGE SURGICOUNT (BAG) ×2 IMPLANT
BAND RUBBER #18 3X1/16 STRL (MISCELLANEOUS) ×4 IMPLANT
BASKET BONE COLLECTION (BASKET) ×2 IMPLANT
BENZOIN TINCTURE PRP APPL 2/3 (GAUZE/BANDAGES/DRESSINGS) ×2 IMPLANT
BIT DRILL NEURO 2X3.1 SFT TUCH (MISCELLANEOUS) ×1 IMPLANT
BONE VIVIGEN FORMABLE 1.3CC (Bone Implant) ×2 IMPLANT
BUR MATCHSTICK NEURO 3.0 LAGG (BURR) ×2 IMPLANT
CANISTER SUCT 3000ML PPV (MISCELLANEOUS) ×2 IMPLANT
CARTRIDGE OIL MAESTRO DRILL (MISCELLANEOUS) ×1 IMPLANT
DECANTER SPIKE VIAL GLASS SM (MISCELLANEOUS) ×1 IMPLANT
DERMABOND ADVANCED (GAUZE/BANDAGES/DRESSINGS) ×1
DERMABOND ADVANCED .7 DNX12 (GAUZE/BANDAGES/DRESSINGS) IMPLANT
DIFFUSER DRILL AIR PNEUMATIC (MISCELLANEOUS) ×2 IMPLANT
DRAPE C-ARM 42X72 X-RAY (DRAPES) ×4 IMPLANT
DRAPE LAPAROTOMY 100X72 PEDS (DRAPES) ×2 IMPLANT
DRAPE MICROSCOPE LEICA (MISCELLANEOUS) ×2 IMPLANT
DRILL NEURO 2X3.1 SOFT TOUCH (MISCELLANEOUS) ×2
DRSG OPSITE POSTOP 4X6 (GAUZE/BANDAGES/DRESSINGS) ×1 IMPLANT
DURAPREP 6ML APPLICATOR 50/CS (WOUND CARE) ×2 IMPLANT
ELECT COATED BLADE 2.86 ST (ELECTRODE) ×2 IMPLANT
ELECT REM PT RETURN 9FT ADLT (ELECTROSURGICAL) ×2
ELECTRODE REM PT RTRN 9FT ADLT (ELECTROSURGICAL) ×1 IMPLANT
GAUZE 4X4 16PLY ~~LOC~~+RFID DBL (SPONGE) IMPLANT
GAUZE SPONGE 4X4 12PLY STRL (GAUZE/BANDAGES/DRESSINGS) ×2 IMPLANT
GLOVE EXAM NITRILE XL STR (GLOVE) IMPLANT
GLOVE SURG ENC MOIS LTX SZ7 (GLOVE) IMPLANT
GLOVE SURG ENC MOIS LTX SZ8 (GLOVE) ×2 IMPLANT
GLOVE SURG LTX SZ6.5 (GLOVE) ×1 IMPLANT
GLOVE SURG UNDER POLY LF SZ6.5 (GLOVE) ×1 IMPLANT
GLOVE SURG UNDER POLY LF SZ7 (GLOVE) IMPLANT
GLOVE SURG UNDER POLY LF SZ8.5 (GLOVE) ×2 IMPLANT
GOWN STRL REUS W/ TWL LRG LVL3 (GOWN DISPOSABLE) ×1 IMPLANT
GOWN STRL REUS W/ TWL XL LVL3 (GOWN DISPOSABLE) ×1 IMPLANT
GOWN STRL REUS W/TWL 2XL LVL3 (GOWN DISPOSABLE) ×1 IMPLANT
GOWN STRL REUS W/TWL LRG LVL3 (GOWN DISPOSABLE) ×4
GOWN STRL REUS W/TWL XL LVL3 (GOWN DISPOSABLE) ×2
GRAFT BNE MATRIX VG FRMBL SM 1 (Bone Implant) IMPLANT
HALTER HD/CHIN CERV TRACTION D (MISCELLANEOUS) ×2 IMPLANT
HEMOSTAT POWDER KIT SURGIFOAM (HEMOSTASIS) ×2 IMPLANT
KIT BASIN OR (CUSTOM PROCEDURE TRAY) ×2 IMPLANT
KIT TURNOVER KIT B (KITS) ×2 IMPLANT
NDL HYPO 18GX1.5 BLUNT FILL (NEEDLE) ×1 IMPLANT
NDL SPNL 20GX3.5 QUINCKE YW (NEEDLE) ×1 IMPLANT
NEEDLE HYPO 18GX1.5 BLUNT FILL (NEEDLE) ×2 IMPLANT
NEEDLE SPNL 20GX3.5 QUINCKE YW (NEEDLE) ×2 IMPLANT
NS IRRIG 1000ML POUR BTL (IV SOLUTION) ×2 IMPLANT
OIL CARTRIDGE MAESTRO DRILL (MISCELLANEOUS) ×2
PACK LAMINECTOMY NEURO (CUSTOM PROCEDURE TRAY) ×2 IMPLANT
PAD ARMBOARD 7.5X6 YLW CONV (MISCELLANEOUS) ×6 IMPLANT
PIN DISTRACTION 14MM (PIN) IMPLANT
PLATE CERV RES ACP 16 1L (Plate) ×1 IMPLANT
RASP 3.0MM (RASP) ×1 IMPLANT
SCREW VA SD 4.2X15 (Screw) ×3 IMPLANT
SCREW VA SD 4.6X15 (Screw) ×1 IMPLANT
SPACER HEDRON C 12X14X8 0D (Spacer) ×1 IMPLANT
SPONGE INTESTINAL PEANUT (DISPOSABLE) ×2 IMPLANT
SPONGE SURGIFOAM ABS GEL SZ50 (HEMOSTASIS) ×2 IMPLANT
STRIP CLOSURE SKIN 1/2X4 (GAUZE/BANDAGES/DRESSINGS) ×2 IMPLANT
SUT VIC AB 3-0 SH 8-18 (SUTURE) ×2 IMPLANT
SUT VICRYL 4-0 PS2 18IN ABS (SUTURE) ×2 IMPLANT
TAPE CLOTH 4X10 WHT NS (GAUZE/BANDAGES/DRESSINGS) ×2 IMPLANT
TOWEL GREEN STERILE (TOWEL DISPOSABLE) ×2 IMPLANT
TOWEL GREEN STERILE FF (TOWEL DISPOSABLE) ×2 IMPLANT
WATER STERILE IRR 1000ML POUR (IV SOLUTION) ×2 IMPLANT

## 2021-11-12 NOTE — Plan of Care (Signed)
Patient ready for discharge. No signs of  acute distress noted. Pain controlled with prescribed pain med.  ?

## 2021-11-12 NOTE — Progress Notes (Signed)
Discharge instruction given/explained  to patient and  verbalized understanding. Personal belongings taken. Patient discharged to home accompanied by significan other. ? ?

## 2021-11-12 NOTE — Discharge Summary (Addendum)
Physician Discharge Summary  ?Patient ID: ?Aimee [name] ?MRN: [id number] ?DOB/AGE: 1971-07-20 51 y.o. ? ?Admit date: 11/12/2021 ?Discharge date: 11/12/2021 ? ?Admission Diagnoses: Cervical spondylosis with stenosis and left C7 radiculopathy and instability at C6-7  ? ? ?Discharge Diagnoses: same ? ? ?Discharged Condition: good ? ?Hospital Course: The patient was admitted on 11/12/2021 and taken to the operating room where the patient underwent acdf C6-7. The patient tolerated the procedure well and was taken to the recovery room and then to the floor in stable condition. The hospital course was routine. There were no complications. The wound remained clean dry and intact. Pt had appropriate neck soreness. No complaints of armg pain or new N/T/W. The patient remained afebrile with stable vital signs, and tolerated a regular diet. The patient continued to increase activities, and pain was well controlled with oral pain medications.  ? ?Consults: None ? ?Significant Diagnostic Studies:  ?Results for orders placed or performed during the hospital encounter of 11/12/21  ?Glucose, capillary  ?Result Value Ref Range  ? Glucose-Capillary 112 (H) 70 - 99 mg/dL  ?Glucose, capillary  ?Result Value Ref Range  ? Glucose-Capillary 106 (H) 70 - 99 mg/dL  ?Glucose, capillary  ?Result Value Ref Range  ? Glucose-Capillary 135 (H) 70 - 99 mg/dL  ?Hemoglobin A1c  ?Result Value Ref Range  ? Hgb A1c MFr Bld 7.0 (H) 4.8 - 5.6 %  ? Mean Plasma Glucose 154.2 mg/dL  ? ? ?DG Cervical Spine 1 View ? ?Result Date: 11/12/2021 ?CLINICAL DATA:  ACDF, removal of hardware. EXAM: DG CERVICAL SPINE - 1 VIEW COMPARISON:  Radiograph dated October 21, 2021 FINDINGS: Fluoroscopic images were obtained intraoperatively and submitted for post operative interpretation. Prior ACDF C3-C6 with removal of hardware at C4, 2 images were obtained with 5 seconds of fluoroscopy time and 0.73 mGy. Please see the performing provider's procedural report for further detail.  IMPRESSION: As above. Electronically Signed   By: Allegra Lai M.D.   On: 11/12/2021 14:56  ? ?DG C-Arm 1-60 Min-No Report ? ?Result Date: 11/12/2021 ?Fluoroscopy was utilized by the requesting physician.  No radiographic interpretation.  ? ?DG C-Arm 1-60 Min-No Report ? ?Result Date: 11/12/2021 ?Fluoroscopy was utilized by the requesting physician.  No radiographic interpretation.   ? ?Antibiotics:  ?Anti-infectives (From admission, onward)  ? ? Start     Dose/Rate Route Frequency Ordered Stop  ? 11/12/21 2000  ceFAZolin (ANCEF) IVPB 2g/100 mL premix       ? 2 g ?200 mL/hr over 30 Minutes Intravenous Every 8 hours 11/12/21 1547 11/13/21 1159  ? 11/12/21 0945  ceFAZolin (ANCEF) IVPB 2g/100 mL premix       ? 2 g ?200 mL/hr over 30 Minutes Intravenous On call to O.R. 11/12/21 0944 11/12/21 1308  ? ?  ? ? ?Discharge Exam: ?Blood pressure 122/84, pulse 83, temperature 98.6 ?F (37 ?C), temperature source Oral, resp. rate 18, height 5\' 8"  (1.727 m), weight 84.1 kg, SpO2 93 %. ?Neurologic: Grossly normal ?Ambulating and voiding well incision cdi  ? ?Discharge Medications:   ?Resume all home meds and take percocet 5-325mg  1 tablet by mouth every 4 hours as needed for pain ? ?Disposition: home  ? ?Final Dx: acdf C6-7 ? ?Discharge Instructions   ? ?  Remove dressing in 72 hours   Complete by: As directed ?  ?  Remove dressing in 72 hours   Complete by: As directed ?  ? Call MD for:  difficulty breathing, headache or visual disturbances  Complete by: As directed ?  ? Call MD for:  difficulty breathing, headache or visual disturbances   Complete by: As directed ?  ? Call MD for:  persistant nausea and vomiting   Complete by: As directed ?  ? Call MD for:  persistant nausea and vomiting   Complete by: As directed ?  ? Call MD for:  redness, tenderness, or signs of infection (pain, swelling, redness, odor or green/yellow discharge around incision site)   Complete by: As directed ?  ? Call MD for:  redness, tenderness, or signs  of infection (pain, swelling, redness, odor or green/yellow discharge around incision site)   Complete by: As directed ?  ? Call MD for:  severe uncontrolled pain   Complete by: As directed ?  ? Call MD for:  severe uncontrolled pain   Complete by: As directed ?  ? Call MD for:  temperature >100.4   Complete by: As directed ?  ? Call MD for:  temperature >100.4   Complete by: As directed ?  ? Diet - low sodium heart healthy   Complete by: As directed ?  ? Diet - low sodium heart healthy   Complete by: As directed ?  ? Increase activity slowly   Complete by: As directed ?  ? Increase activity slowly   Complete by: As directed ?  ? ?  ? ? ? ? ? ?Signed: ?Tiana Loft Eliyahu Bille ?11/12/2021, 5:54 PM ?  ?

## 2021-11-12 NOTE — Transfer of Care (Signed)
Immediate Anesthesia Transfer of Care Note ? ?Patient: Aimee [name] ? ?Procedure(s) Performed: Anterior Cervical Discectomy Fusion - Cervical Six - Cervical Seven Removal of Hardware - Cervical Four - Cervical Six (Spine Cervical) ? ?Patient Location: PACU ? ?Anesthesia Type:General ? ?Level of Consciousness: oriented, drowsy and patient cooperative ? ?Airway & Oxygen Therapy: Patient Spontanous Breathing and Patient connected to nasal cannula oxygen ? ?Post-op Assessment: Report given to RN and Post -op Vital signs reviewed and stable ? ?Post vital signs: Reviewed ? ?Last Vitals:  ?Vitals Value Taken Time  ?BP 128/79 11/12/21 1440  ?Temp    ?Pulse 87 11/12/21 1441  ?Resp 19 11/12/21 1441  ?SpO2 97 % 11/12/21 1441  ?Vitals shown include unvalidated device data. ? ?Last Pain:  ?Vitals:  ? 11/12/21 0958  ?TempSrc:   ?PainSc: 7   ?   ? ?  ? ?Complications: No notable events documented. ?

## 2021-11-12 NOTE — Op Note (Signed)
Preoperative diagnosis: Cervical spondylosis with stenosis and left C7 radiculopathy and instability at C6-7 ? ?Postoperative diagnosis: Same ? ?Procedure: Anterior cervical discectomy and fusion C6-7 utilizing the globus titanium cage packed with locally harvested autograft mixed with Vivigen.  Anterior cervical plating utilizing the globus resonate plating system. ? ?2.  Exploration fusion removal of plate C4-C6 ? ?Surgeon: Dominica Severin Brighid Koch ? ?Assistant: Nash Shearer ? ?Anesthesia: General ? ?EBL: Minimal ? ?HPI: 51 year old female longstanding issues with her neck presented with left C7 radicular pain weakness in her tricep.  Work-up revealed spondylosis C6-7 and instability on cervical flexion-extension films.  And due to her progression of clinical syndrome imaging findings and failed conservative treatment I recommended anterior cervical discectomy fusion at C6-7 with removal of hardware C3-4 to C6.  I extensively went over the risks and benefits of the operation with her as well as perioperative course expectations of outcome and alternatives of surgery and she understands and agrees to proceed forward. ? ?Operative procedure: Patient was brought into the OR was [facility] general anesthesia positioned supine the neck in slight extension 5 pounds halter traction.  The right 7 neck was prepped and draped in routine sterile fashion.  Preoperative x-ray showed that her previous incision from C4-C6 would be adequate to do the C6-7 discectomy so utilizing old incision.  The superficial abscess was dissected out divided longitudinally.  The avascular plane was dissected through the scar tissue to expose C6-7 and exposed the old plate from X2-J1.  I then removed 5 of the 6 screws however was unable to remove the left-sided C5 screw as this was stripped.  So I had to drill into the screw head release it from the plate and then I remove the plate having to leave the shank of the screw behind.  Then I proceeded to perform the  discectomy at C6-7 with 3 mm Kerrison punch remove the anterior osteophytes BA curettes high-speed drill capturing bone shavings and mucus trap.  Then under microscopic lamination further drilled down the posterior annulus and osteophytic complex capturing the bone shavings and mucus capital identification of posterior longitudinal ligament.  This was all removed in piecemeal fashion exposing thecal sac there was marked spondylosis and a large spur coming off the left side displacing the left C7 nerve root this was all removed extensive soft disc material was also removed from the foramen.  Then at the end of discectomy there is no further stenosis I sized up an 8 mm titanium cage packed with locally harvested autograft mixed with vision and inserted.  I then selected a 16 mm globus extend plate all screws had excellent purchase I utilized the old screws and C6 with rescue screws and then the wound was copiously irrigated and meticulous hemostasis was maintained and closed in layers with interrupted Vicryl in the platysma and a running 4 subcuticular.  Dermabond benzoin Steri-Strips and a sterile dressing was applied patient recovery in stable condition.  At the end of the case all needle counts and sponge counts were correct. ?

## 2021-11-12 NOTE — Anesthesia Procedure Notes (Signed)
Procedure Name: Intubation ?Date/Time: 11/12/2021 12:42 PM ?Performed by: Jenne Campus, CRNA ?Pre-anesthesia Checklist: Patient identified, Emergency Drugs available, Suction available and Patient being monitored ?Patient Re-evaluated:Patient Re-evaluated prior to induction ?Oxygen Delivery Method: Circle System Utilized ?Preoxygenation: Pre-oxygenation with 100% oxygen ?Induction Type: IV induction ?Ventilation: Mask ventilation without difficulty ?Laryngoscope Size: Glidescope and 3 ?Grade View: Grade I ?Tube type: Oral ?Tube size: 7.0 mm ?Number of attempts: 1 ?Airway Equipment and Method: Oral airway, Rigid stylet and Video-laryngoscopy ?Placement Confirmation: ETT inserted through vocal cords under direct vision, positive ETCO2 and breath sounds checked- equal and bilateral ?Secured at: 21 cm ?Tube secured with: Tape ?Dental Injury: Teeth and Oropharynx as per pre-operative assessment  ? ? ? ? ?

## 2021-11-12 NOTE — H&P (Signed)
Aimee [name] is an 51 y.o. female.   ?Chief Complaint: Neck left shoulder and arm pain ?HPI: 51 year old female with longstanding neck pain previously undergone 2 previous ACDF's at C3-4 and C4-C6 presents with progressive worsening neck pain left shoulder and arm pain consistent with a C7 nerve root pattern work-up has shown spondylosis at C6-7 with instability on dynamic x-rays and due to her progression of clinical syndrome imaging findings failed conservative treatment I recommended an ACDF at C6-7 with removal of hardware removal of her plate C4-C6.  I explained that may not we will get old plate all the may have to cut off the inferior aspect of the plate but I went through all that and the risks and benefits perioperative course expectations of outcome and alternatives and she understands and agrees to proceed forward. ? ?Past Medical History:  ?Diagnosis Date  ? Arthritis   ? Atypical mole 10/31/2012  ? R neck-mod. ([place])  ? Depression   ? Diabetes mellitus   ? type II   ? Family history of adverse reaction to anesthesia   ? mother- mother has lots of alleriges   ? GERD (gastroesophageal reflux disease)   ? Hidradenitis suppurativa   ? Hidradenitis suppurativa   ? Hypertension   ? Hypertriglyceridemia   ? Neuromuscular disorder ([place])   ? diabetic neuropathy   ? PONV (postoperative nausea and vomiting)   ? PTSD (post-traumatic stress disorder)   ? PTSD (post-traumatic stress disorder)   ? ? ?Past Surgical History:  ?Procedure Laterality Date  ? ABDOMINAL HYSTERECTOMY  2010  ? ABDOMINOPLASTY  2006  ? BREAST EXCISIONAL BIOPSY Right   ? [place]  ? x2  ? CHOLECYSTECTOMY N/A 01/11/2018  ? Procedure: LAPAROSCOPIC CHOLECYSTECTOMY;  Surgeon: [name], MD;  Location: [facility];  Service: General;  Laterality: N/A;  PT HAS PTSD AND IS AGGREGATED BY WAITING FOR LONG PERIODS OF TIME.  ? COCHLEAR IMPLANT Right   ? Bone Anchored Hearing Aid, x2  ? EXCISION VAGINAL CYST Left 01/23/2020  ? Procedure:  EXCISION OF LABIAL VULVAR SEBCEOUS CYSTS;  Surgeon: Jonnie Kind, MD;  Location: [facility];  Service: Gynecology;  Laterality: Left;  ? INNER EAR SURGERY    ? 12 surgeries on R ear starting in 1984; implant 06/2011  ? left  carpal tunnel surgery     ? LIVER BIOPSY N/A 01/11/2018  ? Procedure: LIVER BIOPSY;  Surgeon: [name], MD;  Location: [facility];  Service: General;  Laterality: N/A;  ? SHOULDER ARTHROSCOPY Left 10/2015  ? SPINAL CORD STIMULATOR IMPLANT  2019  ? THORACIC [place] SURGERY  2011, 2012  ? x2  ? TONSILLECTOMY  12/26/2017  ? TUBAL LIGATION    ? ? ?Family History  ?Problem Relation Age of Onset  ? GI problems Mother   ? Mental illness Mother   ? Bipolar disorder Mother   ? Diabetes Father   ? Breast cancer Maternal Aunt   ?     in 30's  ? Liver cancer Maternal Aunt   ? Bipolar disorder Maternal Aunt   ? Schizophrenia Maternal Aunt   ? Personality disorder Maternal Aunt   ? Colon cancer Maternal Grandmother   ? Heart attack Maternal Grandfather   ? Heart attack Paternal Grandmother   ? Heart attack Paternal Grandfather   ? Heart attack Paternal Uncle   ? Lymphoma Paternal Uncle   ? Bone cancer Cousin   ? ?Social History:  reports that she has been smoking  cigarettes. She has a 5.75 pack-year smoking history. She has never used smokeless tobacco. She reports that she does not drink alcohol and does not use drugs. ? ?Allergies:  ?Allergies  ?Allergen Reactions  ? Hydrocodone Itching and Other (See Comments)  ?  And family of drugs ?  ? Oxycodone Itching  ? Tramadol Itching  ? Mirtazapine Other (See Comments)  ?  Chew tongue on sides, foot tapping idiosynchronously  ? Percocet [Oxycodone-Acetaminophen] Itching  ? Darvocet [Propoxyphene N-Acetaminophen] Itching  ? ? ?Medications Prior to Admission  ?Medication Sig Dispense Refill  ? amLODipine (NORVASC) 5 MG tablet Take 1 tablet (5 mg total) by mouth daily. 90 tablet 0  ? atorvastatin (LIPITOR) 40 MG tablet Take 1 tablet (40 mg total) by mouth daily. 90  tablet 0  ? baclofen (LIORESAL) 10 MG tablet Take 1 tablet (10 mg total) by mouth 3 (three) times daily. 270 tablet 1  ? Biotin 10000 MCG TABS Take 10,000 mcg by mouth daily.    ? Calcium Carb-Cholecalciferol (CALCIUM 600 + D PO) Take 1 tablet by mouth daily.    ? diclofenac (VOLTAREN) 75 MG EC tablet TAKE 1 TABLET TWICE A DAY 180 tablet 0  ? diclofenac sodium (VOLTAREN) 1 % GEL Apply 4 g topically 4 (four) times daily. As needed for neck and back pain (Patient taking differently: Apply 4 g topically 4 (four) times daily as needed (neck and back pain).) 100 g 1  ? dicyclomine (BENTYL) 10 MG capsule TAKE ONE CAPSULE THREE TIMES A DAY AS NEEDED FOR ABDOMINAL CRAMPING AND DIARRHEA (Patient taking differently: Take 10 mg by mouth 3 (three) times daily as needed for spasms (and diarrhea).) 270 capsule 1  ? empagliflozin (JARDIANCE) 10 MG TABS tablet TAKE 1 TABLET BY MOUTH ONCE DAILY TO CONTROL DIABETES 90 tablet 0  ? ezetimibe (ZETIA) 10 MG tablet Take 1 tablet (10 mg total) by mouth daily. 90 tablet 1  ? fenofibrate 160 MG tablet Take 1 tablet (160 mg total) by mouth daily. For cholesterol and triglyceride 90 tablet 1  ? gabapentin (NEURONTIN) 600 MG tablet Take 1 each morning two each afternoon and 3 at bedtime 540 tablet 1  ? hydrOXYzine (VISTARIL) 50 MG capsule Take one in am for anxiety and two at bed time. 270 capsule 0  ? lamoTRIgine (LAMICTAL) 100 MG tablet Take 1 tablet (100 mg total) by mouth daily. 90 tablet 0  ? omeprazole (PRILOSEC) 20 MG capsule Take 1 capsule (20 mg total) by mouth daily. 90 capsule 1  ? OZEMPIC, 0.25 OR 0.5 MG/DOSE, 2 MG/1.5ML SOPN Inject 0.25 mg into the skin every Saturday.    ? aspirin 81 MG EC tablet TAKE 1 TABLET (81 MG TOTAL) BY MOUTH DAILY. (NEEDS TO BE SEEN BEFORE NEXT REFILL) 30 tablet 0  ? ciprofloxacin (CILOXAN) 0.3 % ophthalmic solution 1 drop. In affected ear as needed for ear infections    ? glucose blood test strip Use as instructed 200 each 3  ? Lancets MISC Use to check  blood sugars twice daily 200 each 3  ? Omega-3 Fatty Acids (FISH OIL) 1200 MG CAPS Take 1,200 mg by mouth in the morning and at bedtime.    ? ? ?Results for orders placed or performed during the hospital encounter of 11/12/21 (from the past 48 hour(s))  ?Glucose, capillary     Status: Abnormal  ? Collection Time: 11/12/21  9:52 AM  ?Result Value Ref Range  ? Glucose-Capillary 112 (H) 70 - 99 mg/dL  ?  Comment: Glucose reference range applies only to samples taken after fasting for at least 8 hours.  ?Glucose, capillary     Status: Abnormal  ? Collection Time: 11/12/21 11:56 AM  ?Result Value Ref Range  ? Glucose-Capillary 106 (H) 70 - 99 mg/dL  ?  Comment: Glucose reference range applies only to samples taken after fasting for at least 8 hours.  ? ?No results found. ? ?Review of Systems  ?Musculoskeletal:  Positive for neck pain.  ?Neurological:  Positive for numbness.  ? ?Blood pressure 109/80, pulse 93, temperature 97.9 ?F (36.6 ?C), temperature source Oral, resp. rate 20, height 5' 8"  (1.727 m), weight 84.1 kg, SpO2 99 %. ?Physical Exam ?HENT:  ?   Head: Normocephalic.  ?   Right Ear: Tympanic membrane normal.  ?   Mouth/Throat:  ?   Mouth: Mucous membranes are moist.  ?Eyes:  ?   Pupils: Pupils are equal, round, and reactive to light.  ?Cardiovascular:  ?   Rate and Rhythm: Normal rate.  ?Pulmonary:  ?   Effort: Pulmonary effort is normal.  ?Abdominal:  ?   General: Abdomen is flat.  ?Musculoskeletal:     ?   General: Normal range of motion.  ?Neurological:  ?   Mental Status: She is alert.  ?   Comments: Patient is awake and alert strength is 5-5 deltoid, bicep, tricep, wrist flexion, wrist extension, hand intrinsics on the right and 5 out of 5 on the left except slight tricep weakness on the left at 4+ out of 5  ?  ? ?Assessment/Plan ?51 year old presents for ACDF C6-7 removal of hardware. ? ?Elaina Hoops, MD ?11/12/2021, 12:11 PM ? ? ? ?

## 2021-11-12 NOTE — Progress Notes (Signed)
Orthopedic Tech Progress Note ?Patient Details:  ?Aimee [name] ?[birth year] ?[id number] ? ?Ortho Devices ?Type of Ortho Device: Soft collar ?Ortho Device/Splint Location: Neck ?Ortho Device/Splint Interventions: Ordered, Application ?  ?Post Interventions ?Patient Tolerated: Well ? ?Stevon Gough A Cordarius Benning ?11/12/2021, 3:13 PM ? ?

## 2021-11-13 NOTE — Anesthesia Postprocedure Evaluation (Signed)
Anesthesia Post Note ? ?Patient: Aimee [name] ? ?Procedure(s) Performed: Anterior Cervical Discectomy Fusion - Cervical Six - Cervical Seven Removal of Hardware - Cervical Four - Cervical Six (Spine Cervical) ? ?  ? ?Patient location during evaluation: PACU ?Anesthesia Type: General ?Level of consciousness: awake and alert ?Pain management: pain level controlled ?Vital Signs Assessment: post-procedure vital signs reviewed and stable ?Respiratory status: spontaneous breathing, nonlabored ventilation, respiratory function stable and patient connected to nasal cannula oxygen ?Cardiovascular status: blood pressure returned to baseline and stable ?Postop Assessment: no apparent nausea or vomiting ?Anesthetic complications: no ? ? ?No notable events documented. ? ?Last Vitals:  ?Vitals:  ? 11/12/21 1609 11/12/21 1911  ?BP: 122/84 118/78  ?Pulse: 83 95  ?Resp: 18 18  ?Temp: 37 ?C 36.9 ?C  ?SpO2: 93% 92%  ?  ?Last Pain:  ?Vitals:  ? 11/12/21 1938  ?TempSrc:   ?PainSc: 3   ? ? ?  ?  ?  ?  ?  ?  ? ?Shalonda Sachse ? ? ? ? ?

## 2021-11-15 ENCOUNTER — Encounter (HOSPITAL_COMMUNITY): Payer: Self-pay | Admitting: Neurosurgery

## 2021-11-15 MED FILL — Thrombin For Soln 5000 Unit: CUTANEOUS | Qty: 2 | Status: AC

## 2022-06-08 ENCOUNTER — Encounter: Payer: Self-pay | Admitting: Physical Therapy

## 2022-06-08 ENCOUNTER — Other Ambulatory Visit: Payer: Self-pay

## 2022-06-08 ENCOUNTER — Ambulatory Visit: Payer: Medicare (Managed Care) | Attending: Neurosurgery | Admitting: Physical Therapy

## 2022-06-08 DIAGNOSIS — M542 Cervicalgia: Secondary | ICD-10-CM | POA: Insufficient documentation

## 2022-06-08 DIAGNOSIS — R293 Abnormal posture: Secondary | ICD-10-CM | POA: Diagnosis present

## 2022-06-08 DIAGNOSIS — M62838 Other muscle spasm: Secondary | ICD-10-CM | POA: Insufficient documentation

## 2022-06-08 NOTE — Therapy (Signed)
OUTPATIENT PHYSICAL THERAPY CERVICAL EVALUATION   Patient Name: Aimee [name] MRN: [id number] DOB:Jun 13, 1971, 51 y.o., female Today's Date: 06/08/2022   PT End of Session - 06/08/22 1117     Visit Number 1    Number of Visits 10    Date for PT Re-Evaluation 07/20/22    Authorization Type FOTO AT LEAST EVERY 5TH VISIT.  PROGRESS NOTE AT 10TH VISIT.  KX MODIFIER AFTER 15 VISITS.    PT Start Time 1049    PT Stop Time 1115    PT Time Calculation (min) 26 min    Activity Tolerance Patient tolerated treatment well    Behavior During Therapy [facility] for tasks assessed/performed             Past Medical History:  Diagnosis Date   Arthritis    Atypical mole 10/31/2012   R neck-mod. (WS)   Depression    Diabetes mellitus    type II    Family history of adverse reaction to anesthesia    mother- mother has lots of alleriges    GERD (gastroesophageal reflux disease)    Hidradenitis suppurativa    Hidradenitis suppurativa    Hypertension    Hypertriglyceridemia    Neuromuscular disorder (HCC)    diabetic neuropathy    PONV (postoperative nausea and vomiting)    PTSD (post-traumatic stress disorder)    PTSD (post-traumatic stress disorder)    Past Surgical History:  Procedure Laterality Date   ABDOMINAL HYSTERECTOMY  2010   ABDOMINOPLASTY  2006   ANTERIOR CERVICAL DECOMP/DISCECTOMY FUSION N/A 11/12/2021   Procedure: Anterior Cervical Discectomy Fusion - Cervical Six - Cervical Seven Removal of Hardware - Cervical Four - Cervical Six;  Surgeon: Donalee Citrin, MD;  Location: [facility] OR;  Service: Neurosurgery;  Laterality: N/A;   BREAST EXCISIONAL BIOPSY Right    CESAREAN SECTION  1991, 1995   x2   CHOLECYSTECTOMY N/A 01/11/2018   Procedure: LAPAROSCOPIC CHOLECYSTECTOMY;  Surgeon: Gaynelle Adu, MD;  Location: [facility]S;  Service: General;  Laterality: N/A;  PT HAS PTSD AND IS AGGREGATED BY WAITING FOR LONG PERIODS OF TIME.   COCHLEAR IMPLANT Right    Bone Anchored Hearing Aid, x2   EXCISION  VAGINAL CYST Left 01/23/2020   Procedure: EXCISION OF LABIAL VULVAR SEBCEOUS CYSTS;  Surgeon: Tilda Burrow, MD;  Location: [facility];  Service: Gynecology;  Laterality: Left;   INNER EAR SURGERY     12 surgeries on R ear starting in 1984; implant 06/2011   left  carpal tunnel surgery      LIVER BIOPSY N/A 01/11/2018   Procedure: LIVER BIOPSY;  Surgeon: Gaynelle Adu, MD;  Location: [facility];  Service: General;  Laterality: N/A;   SHOULDER ARTHROSCOPY Left 10/2015   SPINAL CORD STIMULATOR IMPLANT  2019   THORACIC DISC SURGERY  2011, 2012   x2   TONSILLECTOMY  12/26/2017   TUBAL LIGATION     Patient Active Problem List   Diagnosis Date Noted   Spinal stenosis of cervical region 11/12/2021   Hidradenitis suppurativa    Unilateral primary osteoarthritis, left hip 10/22/2019   Hx of fusion of cervical spine 02/27/2019   Tendinopathy of rotator cuff, left 02/27/2019   Aortic atherosclerosis (HCC) 05/22/2018   Ganglion cyst of finger 09/13/2017   Status post placement of bone anchored hearing aid (BAHA) 08/14/2017   Fibromyalgia 11/29/2016   Lumbar radiculopathy 11/21/2016   Essential hypertension 04/22/2016   Severe obesity (BMI >= 40) (HCC) 10/08/2015   PTSD (post-traumatic stress disorder)  09/10/2015   Trapezius strain 08/19/2015   Depression 08/19/2015   Hypertriglyceridemia 11/12/2014   Peripheral neuropathy 11/12/2014   DDD (degenerative disc disease), lumbar 11/12/2014   Varicose veins of leg with pain, bilateral 05/12/2014   Diabetes (HCC) 03/05/2013   Smoking 03/05/2013   REFERRING PROVIDER: Donalee Citrin MD  REFERRING DIAG: Cervicalgia.  THERAPY DIAG:  No diagnosis found.  Rationale for Evaluation and Treatment Rehabilitation  ONSET DATE: Ongoing.  11/12/21 (surgery date).  SUBJECTIVE:                                                                                                                                                                                                          SUBJECTIVE STATEMENT: The patient presents to the clinic today with c/o upper back and neck pain.  Her neck muscle feel very tight.  She had a ACDF performed on 11/12/21.  Her pain is rated at an 8/10 today.  Heat and ice help decrease her pain some.  Rainy weather and performing ADL's increase her pain.  Her pain is described as an ache, sore, throbbing and deep type pain.  She states she drops items at times.  Her sleep is disturbed by pain.  PERTINENT HISTORY:  Prior spinal surgeries, spinal stimulator, PTSD, OA, DM, HOH, left CTR, left shoulder surgery.  PAIN:  Are you having pain? Yes: NPRS scale: 8/10 Pain location: Bilateral neck, left > right. Pain description: As above. Aggravating factors: As above. Relieving factors: As above.  PRECAUTIONS: Other: ACDF.  WEIGHT BEARING RESTRICTIONS No  FALLS:  Has patient fallen in last 6 months? No  LIVING ENVIRONMENT: Lives with: lives with their spouse Lives in: House/apartment Has following equipment at home: None  OCCUPATION: Disabled.  PLOF: Independent with basic ADLs  PATIENT GOALS:  Decrease pain and do more with less pain.  OBJECTIVE:  PATIENT SURVEYS:  FOTO Complete.  POSTURE: rounded shoulders  PALPATION: Tender to palpation over bilateral upper thoracic musculature with increased tone and tenderness over her UT's and Levator Scapulae, left >right.   CERVICAL ROM:  Active right cervical rotation is 53 degrees and left is 47 degrees. UPPER EXTREMITY ROM:  Bilateral UE range of motion is WNL.  UPPER EXTREMITY MMT:  Normal bilateral UE strength.  Right grip (dominant side) is 38# and left is 35#.  DTR's:  Left Bicep diminished, other 1+ to 2+/4+.  ASSESSMENT:  CLINICAL IMPRESSION: The patient presents to [facility] with c/o bilateral upper thoracic and neck pain.  She underwent a ACDF on 11/12/21.  She has diffuse palpable tone  over her bilateral upper Thoracic and  UT/Levator Scap muscles group left >  right.  She has a loss of active cervical rotation as expected.  She exhibits normal UE strength.  Her ability to perform ADL's are impaired due to high pain-levels.  Patient will benefit from skilled physical therapy intervention to address pain and deficits.   OBJECTIVE IMPAIRMENTS decreased activity tolerance, decreased ROM, increased muscle spasms, postural dysfunction, and pain.   ACTIVITY LIMITATIONS carrying, lifting, and sleeping  PARTICIPATION LIMITATIONS: cleaning and laundry  PERSONAL FACTORS Time since onset of injury/illness/exacerbation, 1 comorbidity: previous surgeries, and 1-2 comorbidities: DM  are also affecting patient's functional outcome.   REHAB POTENTIAL: Good  CLINICAL DECISION MAKING: Stable/uncomplicated  EVALUATION COMPLEXITY: Low   GOALS:   SHORT TERM GOALS: Target date: 06/22/2022   Ind with an HEP. Baseline:  Goal status: INITIAL  LONG TERM GOALS: Target date: 07/20/2022  Bilateral active cervical rotation to 60 degrees. Baseline:  Goal status: INITIAL  2.  Perform ADL's with neck pain not > 4/10. Baseline:  Goal status: INITIAL  3.  Sleep undisturbed without waking due to neck pain. Baseline:  Goal status: INITIAL     PLAN: PT FREQUENCY: 2x/week  PT DURATION: other: 5 weeks.  PLANNED INTERVENTIONS: Therapeutic exercises, Therapeutic activity, Patient/Family education, Self Care, Dry Needling, Electrical stimulation, Cryotherapy, Moist heat, Ultrasound, and Manual therapy  PLAN FOR NEXT SESSION: Dry needling and STW/M.   Loistine Eberlin, [place], PT 06/08/2022, 11:48 AM

## 2022-06-13 ENCOUNTER — Ambulatory Visit: Payer: Medicare (Managed Care) | Attending: Neurosurgery | Admitting: Physical Therapy

## 2022-06-13 ENCOUNTER — Encounter: Payer: Self-pay | Admitting: Physical Therapy

## 2022-06-13 DIAGNOSIS — R293 Abnormal posture: Secondary | ICD-10-CM | POA: Insufficient documentation

## 2022-06-13 DIAGNOSIS — M62838 Other muscle spasm: Secondary | ICD-10-CM | POA: Diagnosis present

## 2022-06-13 DIAGNOSIS — M542 Cervicalgia: Secondary | ICD-10-CM | POA: Diagnosis not present

## 2022-06-13 NOTE — Therapy (Signed)
OUTPATIENT PHYSICAL THERAPY CERVICAL EVALUATION   Patient Name: Aimee [name] MRN: [id number] DOB:[birth year], 51 y.o., female Today's Date: 06/13/2022   PT End of Session - 06/13/22 1404     Visit Number 2    Number of Visits 10    Date for PT Re-Evaluation 07/20/22    Authorization Type FOTO AT LEAST EVERY 5TH VISIT.  PROGRESS NOTE AT 10TH VISIT.  KX MODIFIER AFTER 15 VISITS.    PT Start Time 0100    PT Stop Time 0155    PT Time Calculation (min) 55 min    Behavior During Therapy [facility] for tasks assessed/performed             Past Medical History:  Diagnosis Date   Arthritis    Atypical mole 10/31/2012   R neck-mod. (WS)   Depression    Diabetes mellitus    type II    Family history of adverse reaction to anesthesia    mother- mother has lots of alleriges    GERD (gastroesophageal reflux disease)    Hidradenitis suppurativa    Hidradenitis suppurativa    Hypertension    Hypertriglyceridemia    Neuromuscular disorder (HCC)    diabetic neuropathy    PONV (postoperative nausea and vomiting)    PTSD (post-traumatic stress disorder)    PTSD (post-traumatic stress disorder)    Past Surgical History:  Procedure Laterality Date   ABDOMINAL HYSTERECTOMY  2010   ABDOMINOPLASTY  2006   ANTERIOR CERVICAL DECOMP/DISCECTOMY FUSION N/A 11/12/2021   Procedure: Anterior Cervical Discectomy Fusion - Cervical Six - Cervical Seven Removal of Hardware - Cervical Four - Cervical Six;  Surgeon: Donalee Citrin, MD;  Location: [facility] OR;  Service: Neurosurgery;  Laterality: N/A;   BREAST EXCISIONAL BIOPSY Right    CESAREAN SECTION  1991, 1995   x2   CHOLECYSTECTOMY N/A 01/11/2018   Procedure: LAPAROSCOPIC CHOLECYSTECTOMY;  Surgeon: Gaynelle Adu, MD;  Location: [facility];  Service: General;  Laterality: N/A;  PT HAS PTSD AND IS AGGREGATED BY WAITING FOR LONG PERIODS OF TIME.   COCHLEAR IMPLANT Right    Bone Anchored Hearing Aid, x2   EXCISION VAGINAL CYST Left 01/23/2020   Procedure: EXCISION OF  LABIAL VULVAR SEBCEOUS CYSTS;  Surgeon: Tilda Burrow, MD;  Location: [facility];  Service: Gynecology;  Laterality: Left;   INNER EAR SURGERY     12 surgeries on R ear starting in 1984; implant 06/2011   left  carpal tunnel surgery      LIVER BIOPSY N/A 01/11/2018   Procedure: LIVER BIOPSY;  Surgeon: Gaynelle Adu, MD;  Location: [facility];  Service: General;  Laterality: N/A;   SHOULDER ARTHROSCOPY Left 10/2015   SPINAL CORD STIMULATOR IMPLANT  2019   THORACIC DISC SURGERY  2011, 2012   x2   TONSILLECTOMY  12/26/2017   TUBAL LIGATION     Patient Active Problem List   Diagnosis Date Noted   Spinal stenosis of cervical region 11/12/2021   Hidradenitis suppurativa    Unilateral primary osteoarthritis, left hip 10/22/2019   Hx of fusion of cervical spine 02/27/2019   Tendinopathy of rotator cuff, left 02/27/2019   Aortic atherosclerosis (HCC) 05/22/2018   Ganglion cyst of finger 09/13/2017   Status post placement of bone anchored hearing aid (BAHA) 08/14/2017   Fibromyalgia 11/29/2016   Lumbar radiculopathy 11/21/2016   Essential hypertension 04/22/2016   Severe obesity (BMI >= 40) (HCC) 10/08/2015   PTSD (post-traumatic stress disorder) 09/10/2015   Trapezius strain 08/19/2015   Depression  08/19/2015   Hypertriglyceridemia 11/12/2014   Peripheral neuropathy 11/12/2014   DDD (degenerative disc disease), lumbar 11/12/2014   Varicose veins of leg with pain, bilateral 05/12/2014   Diabetes (HCC) 03/05/2013   Smoking 03/05/2013   REFERRING PROVIDER: Donalee Citrin MD  REFERRING DIAG: Cervicalgia.  THERAPY DIAG:  Cervicalgia  Other muscle spasm  Abnormal posture  Rationale for Evaluation and Treatment Rehabilitation  ONSET DATE: Ongoing.  11/12/21 (surgery date).  SUBJECTIVE:                                                                                                                                                                                                          SUBJECTIVE STATEMENT: Pian very high last night.  About a 5 now. PERTINENT HISTORY:  Prior spinal surgeries, spinal stimulator, PTSD, OA, DM, HOH, left CTR, left shoulder surgery.  PAIN:  Are you having pain? Yes: NPRS scale: 5/10 Pain location: Bilateral neck, left > right. Pain description: As above. Aggravating factors: As above. Relieving factors: As above.  PRECAUTIONS: Other: ACDF.  PATIENT GOALS:  Decrease pain and do more with less pain.  OBJECTIVE:        TODAY's TX:          Trigger Point Dry-Needling  Treatment instructions: Expect mild to moderate muscle soreness. S/S of pneumothorax if dry needled over a lung field, and to seek immediate medical attention should they occur. Patient verbalized understanding of these instructions and education.  Patient Consent Given: Yes Education handout provided: Yes Muscles treated: Bilateral UT's.                 Manual:  STW/M x 23 minutes to patient's bilateral cervical and upper thoracic musculature with ischemic release technique utilized f/b low-level IFC at 80-150 Hz on 40% scan x 15 minutes.  Normal modality response following removal of modality. ASSESSMENT:  CLINICAL IMPRESSION:  Patient did well with treatment today including dry needling to her bilateral UT's and tolerated without complaint.  Felt better after treatment.   GOALS:   SHORT TERM GOALS: Target date: 06/27/2022   Ind with an HEP. Baseline:  Goal status: INITIAL  LONG TERM GOALS: Target date: 07/25/2022  Bilateral active cervical rotation to 60 degrees. Baseline:  Goal status: INITIAL  2.  Perform ADL's with neck pain not > 4/10. Baseline:  Goal status: INITIAL  3.  Sleep undisturbed without waking due to neck pain. Baseline:  Goal status: INITIAL     PLAN: PT FREQUENCY: 2x/week  PT DURATION: other: 5 weeks.  PLANNED INTERVENTIONS: Therapeutic exercises,  Therapeutic activity, Patient/Family education, Self Care, Dry Needling,  Electrical stimulation, Cryotherapy, Moist heat, Ultrasound, and Manual therapy  PLAN FOR NEXT SESSION: Dry needling and STW/M.   Ademide Schaberg, [place], PT 06/13/2022, 2:14 PM

## 2022-06-16 ENCOUNTER — Ambulatory Visit: Payer: Medicare (Managed Care)

## 2022-06-16 DIAGNOSIS — M542 Cervicalgia: Secondary | ICD-10-CM | POA: Diagnosis not present

## 2022-06-16 DIAGNOSIS — M62838 Other muscle spasm: Secondary | ICD-10-CM

## 2022-06-16 DIAGNOSIS — R293 Abnormal posture: Secondary | ICD-10-CM

## 2022-06-16 NOTE — Therapy (Signed)
OUTPATIENT PHYSICAL THERAPY CERVICAL EVALUATION   Patient Name: Aimee [name] MRN: [id number] DOB:September 03, 1971, 51 y.o., female Today's Date: 06/16/2022   PT End of Session - 06/16/22 1122     Visit Number 3    Number of Visits 10    Date for PT Re-Evaluation 07/20/22    Authorization Type FOTO AT LEAST EVERY 5TH VISIT.  PROGRESS NOTE AT 10TH VISIT.  KX MODIFIER AFTER 15 VISITS.    PT Start Time 1115    PT Stop Time 1210    PT Time Calculation (min) 55 min    Behavior During Therapy [facility] for tasks assessed/performed             Past Medical History:  Diagnosis Date   Arthritis    Atypical mole 10/31/2012   R neck-mod. (WS)   Depression    Diabetes mellitus    type II    Family history of adverse reaction to anesthesia    mother- mother has lots of alleriges    GERD (gastroesophageal reflux disease)    Hidradenitis suppurativa    Hidradenitis suppurativa    Hypertension    Hypertriglyceridemia    Neuromuscular disorder ([place])    diabetic neuropathy    PONV (postoperative nausea and vomiting)    PTSD (post-traumatic stress disorder)    PTSD (post-traumatic stress disorder)    Past Surgical History:  Procedure Laterality Date   ABDOMINAL HYSTERECTOMY  2010   ABDOMINOPLASTY  2006   ANTERIOR CERVICAL DECOMP/DISCECTOMY FUSION N/A 11/12/2021   Procedure: Anterior Cervical Discectomy Fusion - Cervical Six - Cervical Seven Removal of Hardware - Cervical Four - Cervical Six;  Surgeon: Kary Kos, MD;  Location: [place];  Service: Neurosurgery;  Laterality: N/A;   BREAST EXCISIONAL BIOPSY Right    CESAREAN SECTION  1991, 1995   x2   CHOLECYSTECTOMY N/A 01/11/2018   Procedure: LAPAROSCOPIC CHOLECYSTECTOMY;  Surgeon: [name], MD;  Location: [facility];  Service: General;  Laterality: N/A;  PT HAS PTSD AND IS AGGREGATED BY WAITING FOR LONG PERIODS OF TIME.   COCHLEAR IMPLANT Right    Bone Anchored Hearing Aid, x2   EXCISION VAGINAL CYST Left 01/23/2020   Procedure: EXCISION OF  LABIAL VULVAR SEBCEOUS CYSTS;  Surgeon: Jonnie Kind, MD;  Location: [facility];  Service: Gynecology;  Laterality: Left;   INNER EAR SURGERY     12 surgeries on R ear starting in 1984; implant 06/2011   left  carpal tunnel surgery      LIVER BIOPSY N/A 01/11/2018   Procedure: LIVER BIOPSY;  Surgeon: [name], MD;  Location: [facility];  Service: General;  Laterality: N/A;   SHOULDER ARTHROSCOPY Left 10/2015   SPINAL CORD STIMULATOR IMPLANT  2019   THORACIC [place] SURGERY  2011, 2012   x2   TONSILLECTOMY  12/26/2017   TUBAL LIGATION     Patient Active Problem List   Diagnosis Date Noted   Spinal stenosis of cervical region 11/12/2021   Hidradenitis suppurativa    Unilateral primary osteoarthritis, left hip 10/22/2019   Hx of fusion of cervical spine 02/27/2019   Tendinopathy of rotator cuff, left 02/27/2019   Aortic atherosclerosis ([place]) 05/22/2018   Ganglion cyst of finger 09/13/2017   Status post placement of bone anchored hearing aid (BAHA) 08/14/2017   Fibromyalgia 11/29/2016   Lumbar radiculopathy 11/21/2016   Essential hypertension 04/22/2016   Severe obesity (BMI >= 40) ([place]) 10/08/2015   PTSD (post-traumatic stress disorder) 09/10/2015   Trapezius strain 08/19/2015   Depression  08/19/2015   Hypertriglyceridemia 11/12/2014   Peripheral neuropathy 11/12/2014   DDD (degenerative disc disease), lumbar 11/12/2014   Varicose veins of leg with pain, bilateral 05/12/2014   Diabetes ([place]) 03/05/2013   Smoking 03/05/2013   REFERRING PROVIDER: Kary Kos MD  REFERRING DIAG: Cervicalgia.  THERAPY DIAG:  Cervicalgia  Other muscle spasm  Abnormal posture  Rationale for Evaluation and Treatment Rehabilitation  ONSET DATE: Ongoing.  11/12/21 (surgery date).  SUBJECTIVE:                                                                                                                                                                                                          SUBJECTIVE STATEMENT: Pain reports some relief dry needling at last treatment session.   PERTINENT HISTORY:  Prior spinal surgeries, spinal stimulator, PTSD, OA, DM, HOH, left CTR, left shoulder surgery.  PAIN:  Are you having pain? Yes: NPRS scale: 4/10 Pain location: Bilateral neck, left > right. Pain description: As above. Aggravating factors: As above. Relieving factors: As above.  PRECAUTIONS: Other: ACDF.  PATIENT GOALS:  Decrease pain and do more with less pain.  OBJECTIVE:        TODAY's TX:                                       EXERCISE LOG  Exercise Repetitions and Resistance Comments  UBE 120 RPMs x 6 mins (forward/backward)   Rows Red t-band x 20 reps    Extension Red t-band x 20 reps   Horizontal Abduction Red t-band x 14 reps        Blank cell = exercise not performed today   Manual Therapy Soft Tissue Mobilization: left upper trap and levator scap, IASTM/W and trigger point release to left upper trap and levator scap to decrease pain and tone                   Modalities  Date:  Unattended Estim: Cervical, Pre-Mod 80-150 Hz, 15 mins, Pain and Tone Hot Pack: Cervical, 15 mins, Pain and Tone          ASSESSMENT:  CLINICAL IMPRESSION:   Pt arrives for today's treatment session reporting 4/10 left cervical pain.  Pt able to tolerate UBE for warm up today with minimal increased stiffness in left cervical region.  Pt instructed in seated postural exercises with tband.  Pt able to perform all reps asked of her except with horizontal abduction which caused  increased left cervical pain after 14 reps.  IASTM/W and trigger point release performed to left upper trap and levator scap to decrease pain and tone.  Normal responses to estim and MH noted upon removal.  Pt reported 3/10 left cervical pain at completion of today's treatment session.     GOALS:   SHORT TERM GOALS: Target date: 06/30/2022   Ind with an HEP. Baseline:  Goal status: INITIAL  LONG  TERM GOALS: Target date: 07/28/2022  Bilateral active cervical rotation to 60 degrees. Baseline:  Goal status: INITIAL  2.  Perform ADL's with neck pain not > 4/10. Baseline:  Goal status: INITIAL  3.  Sleep undisturbed without waking due to neck pain. Baseline:  Goal status: INITIAL     PLAN: PT FREQUENCY: 2x/week  PT DURATION: other: 5 weeks.  PLANNED INTERVENTIONS: Therapeutic exercises, Therapeutic activity, Patient/Family education, Self Care, Dry Needling, Electrical stimulation, Cryotherapy, Moist heat, Ultrasound, and Manual therapy  PLAN FOR NEXT SESSION: Dry needling and STW/M.   Kathrynn Ducking, PTA 06/16/2022, 12:16 PM

## 2022-06-21 ENCOUNTER — Encounter: Payer: Self-pay | Admitting: Physical Therapy

## 2022-06-21 ENCOUNTER — Ambulatory Visit: Payer: Medicare (Managed Care) | Admitting: Physical Therapy

## 2022-06-21 DIAGNOSIS — M542 Cervicalgia: Secondary | ICD-10-CM | POA: Diagnosis not present

## 2022-06-21 DIAGNOSIS — M62838 Other muscle spasm: Secondary | ICD-10-CM

## 2022-06-21 DIAGNOSIS — R293 Abnormal posture: Secondary | ICD-10-CM

## 2022-06-21 NOTE — Therapy (Addendum)
OUTPATIENT PHYSICAL THERAPY CERVICAL EVALUATION   Patient Name: Aimee [name] MRN: [id number] DOB:[birth year], 51 y.o., female Today's Date: 06/21/2022   PT End of Session - 06/21/22 1338     Visit Number 4    Number of Visits 10    Authorization Type FOTO AT LEAST EVERY 5TH VISIT.  PROGRESS NOTE AT 10TH VISIT.  KX MODIFIER AFTER 15 VISITS.    PT Start Time 0100    PT Stop Time 0157    PT Time Calculation (min) 57 min    Activity Tolerance Patient tolerated treatment well    Behavior During Therapy [facility] for tasks assessed/performed              Past Medical History:  Diagnosis Date   Arthritis    Atypical mole 10/31/2012   R neck-mod. (WS)   Depression    Diabetes mellitus    type II    Family history of adverse reaction to anesthesia    mother- mother has lots of alleriges    GERD (gastroesophageal reflux disease)    Hidradenitis suppurativa    Hidradenitis suppurativa    Hypertension    Hypertriglyceridemia    Neuromuscular disorder (HCC)    diabetic neuropathy    PONV (postoperative nausea and vomiting)    PTSD (post-traumatic stress disorder)    PTSD (post-traumatic stress disorder)    Past Surgical History:  Procedure Laterality Date   ABDOMINAL HYSTERECTOMY  2010   ABDOMINOPLASTY  2006   ANTERIOR CERVICAL DECOMP/DISCECTOMY FUSION N/A 11/12/2021   Procedure: Anterior Cervical Discectomy Fusion - Cervical Six - Cervical Seven Removal of Hardware - Cervical Four - Cervical Six;  Surgeon: Donalee Citrin, MD;  Location: [facility] OR;  Service: Neurosurgery;  Laterality: N/A;   BREAST EXCISIONAL BIOPSY Right    CESAREAN SECTION  1991, 1995   x2   CHOLECYSTECTOMY N/A 01/11/2018   Procedure: LAPAROSCOPIC CHOLECYSTECTOMY;  Surgeon: Gaynelle Adu, MD;  Location: [facility];  Service: General;  Laterality: N/A;  PT HAS PTSD AND IS AGGREGATED BY WAITING FOR LONG PERIODS OF TIME.   COCHLEAR IMPLANT Right    Bone Anchored Hearing Aid, x2   EXCISION VAGINAL CYST Left 01/23/2020    Procedure: EXCISION OF LABIAL VULVAR SEBCEOUS CYSTS;  Surgeon: Tilda Burrow, MD;  Location: [facility];  Service: Gynecology;  Laterality: Left;   INNER EAR SURGERY     12 surgeries on R ear starting in 1984; implant 06/2011   left  carpal tunnel surgery      LIVER BIOPSY N/A 01/11/2018   Procedure: LIVER BIOPSY;  Surgeon: Gaynelle Adu, MD;  Location: [facility];  Service: General;  Laterality: N/A;   SHOULDER ARTHROSCOPY Left 10/2015   SPINAL CORD STIMULATOR IMPLANT  2019   THORACIC DISC SURGERY  2011, 2012   x2   TONSILLECTOMY  12/26/2017   TUBAL LIGATION     Patient Active Problem List   Diagnosis Date Noted   Spinal stenosis of cervical region 11/12/2021   Hidradenitis suppurativa    Unilateral primary osteoarthritis, left hip 10/22/2019   Hx of fusion of cervical spine 02/27/2019   Tendinopathy of rotator cuff, left 02/27/2019   Aortic atherosclerosis (HCC) 05/22/2018   Ganglion cyst of finger 09/13/2017   Status post placement of bone anchored hearing aid (BAHA) 08/14/2017   Fibromyalgia 11/29/2016   Lumbar radiculopathy 11/21/2016   Essential hypertension 04/22/2016   Severe obesity (BMI >= 40) (HCC) 10/08/2015   PTSD (post-traumatic stress disorder) 09/10/2015   Trapezius strain 08/19/2015  Depression 08/19/2015   Hypertriglyceridemia 11/12/2014   Peripheral neuropathy 11/12/2014   DDD (degenerative disc disease), lumbar 11/12/2014   Varicose veins of leg with pain, bilateral 05/12/2014   Diabetes (HCC) 03/05/2013   Smoking 03/05/2013   REFERRING PROVIDER: Donalee Citrin MD  REFERRING DIAG: Cervicalgia.  THERAPY DIAG:  Cervicalgia  Other muscle spasm  Abnormal posture  Rationale for Evaluation and Treatment Rehabilitation  ONSET DATE: Ongoing.  11/12/21 (surgery date).  SUBJECTIVE:                                                                                                                                                                                                          SUBJECTIVE STATEMENT: Pian at a 4. PERTINENT HISTORY:  Prior spinal surgeries, spinal stimulator, PTSD, OA, DM, HOH, left CTR, left shoulder surgery.  PAIN:  Are you having pain? Yes: NPRS scale: 4/10 Pain location: Bilateral neck, left > right. Pain description: As above. Aggravating factors: As above. Relieving factors: As above.  PRECAUTIONS: Other: ACDF.  PATIENT GOALS:  Decrease pain and do more with less pain.  OBJECTIVE:        TODAY's TX:    DN to bilateral UT's with excellent twitch response. Combo e'stim/US at 1.50 W/CM2 x 12 minutes to bilateral UT muscle bellies f/b STW/W x 11 minutes to patient's bilateral UT's with ischemic release technique utilized.               Modalities  Date:  Unattended Estim: Cervical, Pre-Mod 80-150 Hz, 20 mins, Pain and Tone Hot Pack: Cervical, 20 mins, Pain and Tone          ASSESSMENT:  CLINICAL IMPRESSION:   Excellent twitch response with dry needling to patient's bilateral UT's.  Significant tone remains over her left UT.  She tolerated treatment well with normal modality response following removal of modality.  GOALS:   SHORT TERM GOALS: Target date: 07/05/2022   Ind with an HEP. Baseline:  Goal status: INITIAL  LONG TERM GOALS: Target date: 08/02/2022  Bilateral active cervical rotation to 60 degrees. Baseline:  Goal status: INITIAL  2.  Perform ADL's with neck pain not > 4/10. Baseline:  Goal status: INITIAL  3.  Sleep undisturbed without waking due to neck pain. Baseline:  Goal status: INITIAL     PLAN: PT FREQUENCY: 2x/week  PT DURATION: other: 5 weeks.  PLANNED INTERVENTIONS: Therapeutic exercises, Therapeutic activity, Patient/Family education, Self Care, Dry Needling, Electrical stimulation, Cryotherapy, Moist heat, Ultrasound, and Manual therapy  PLAN FOR NEXT SESSION: Dry needling and STW/M.  Stormy Connon, [place], PT 06/21/2022, 2:14 PM

## 2022-06-23 ENCOUNTER — Ambulatory Visit: Payer: Medicare (Managed Care) | Admitting: Physical Therapy

## 2022-06-23 DIAGNOSIS — M542 Cervicalgia: Secondary | ICD-10-CM

## 2022-06-23 DIAGNOSIS — R293 Abnormal posture: Secondary | ICD-10-CM

## 2022-06-23 DIAGNOSIS — M62838 Other muscle spasm: Secondary | ICD-10-CM

## 2022-06-23 NOTE — Therapy (Signed)
OUTPATIENT PHYSICAL THERAPY CERVICAL EVALUATION   Patient Name: Aimee [name] MRN: [id number] DOB:1971-05-07, 51 y.o., female Today's Date: 06/23/2022   PT End of Session - 06/23/22 1337     Visit Number 5    Number of Visits 10    Date for PT Re-Evaluation 07/20/22    Authorization Type FOTO AT LEAST EVERY 5TH VISIT.  PROGRESS NOTE AT 10TH VISIT.  KX MODIFIER AFTER 15 VISITS.    PT Start Time 0102    PT Stop Time 0153    PT Time Calculation (min) 51 min    Activity Tolerance Patient tolerated treatment well    Behavior During Therapy [facility] for tasks assessed/performed              Past Medical History:  Diagnosis Date   Arthritis    Atypical mole 10/31/2012   R neck-mod. (WS)   Depression    Diabetes mellitus    type II    Family history of adverse reaction to anesthesia    mother- mother has lots of alleriges    GERD (gastroesophageal reflux disease)    Hidradenitis suppurativa    Hidradenitis suppurativa    Hypertension    Hypertriglyceridemia    Neuromuscular disorder (HCC)    diabetic neuropathy    PONV (postoperative nausea and vomiting)    PTSD (post-traumatic stress disorder)    PTSD (post-traumatic stress disorder)    Past Surgical History:  Procedure Laterality Date   ABDOMINAL HYSTERECTOMY  2010   ABDOMINOPLASTY  2006   ANTERIOR CERVICAL DECOMP/DISCECTOMY FUSION N/A 11/12/2021   Procedure: Anterior Cervical Discectomy Fusion - Cervical Six - Cervical Seven Removal of Hardware - Cervical Four - Cervical Six;  Surgeon: Donalee Citrin, MD;  Location: [facility] OR;  Service: Neurosurgery;  Laterality: N/A;   BREAST EXCISIONAL BIOPSY Right    CESAREAN SECTION  1991, 1995   x2   CHOLECYSTECTOMY N/A 01/11/2018   Procedure: LAPAROSCOPIC CHOLECYSTECTOMY;  Surgeon: Gaynelle Adu, MD;  Location: [facility];  Service: General;  Laterality: N/A;  PT HAS PTSD AND IS AGGREGATED BY WAITING FOR LONG PERIODS OF TIME.   COCHLEAR IMPLANT Right    Bone Anchored Hearing Aid, x2    EXCISION VAGINAL CYST Left 01/23/2020   Procedure: EXCISION OF LABIAL VULVAR SEBCEOUS CYSTS;  Surgeon: Tilda Burrow, MD;  Location: [facility];  Service: Gynecology;  Laterality: Left;   INNER EAR SURGERY     12 surgeries on R ear starting in 1984; implant 06/2011   left  carpal tunnel surgery      LIVER BIOPSY N/A 01/11/2018   Procedure: LIVER BIOPSY;  Surgeon: Gaynelle Adu, MD;  Location: [facility];  Service: General;  Laterality: N/A;   SHOULDER ARTHROSCOPY Left 10/2015   SPINAL CORD STIMULATOR IMPLANT  2019   THORACIC DISC SURGERY  2011, 2012   x2   TONSILLECTOMY  12/26/2017   TUBAL LIGATION     Patient Active Problem List   Diagnosis Date Noted   Spinal stenosis of cervical region 11/12/2021   Hidradenitis suppurativa    Unilateral primary osteoarthritis, left hip 10/22/2019   Hx of fusion of cervical spine 02/27/2019   Tendinopathy of rotator cuff, left 02/27/2019   Aortic atherosclerosis (HCC) 05/22/2018   Ganglion cyst of finger 09/13/2017   Status post placement of bone anchored hearing aid (BAHA) 08/14/2017   Fibromyalgia 11/29/2016   Lumbar radiculopathy 11/21/2016   Essential hypertension 04/22/2016   Severe obesity (BMI >= 40) (HCC) 10/08/2015   PTSD (post-traumatic stress  disorder) 09/10/2015   Trapezius strain 08/19/2015   Depression 08/19/2015   Hypertriglyceridemia 11/12/2014   Peripheral neuropathy 11/12/2014   DDD (degenerative disc disease), lumbar 11/12/2014   Varicose veins of leg with pain, bilateral 05/12/2014   Diabetes (HCC) 03/05/2013   Smoking 03/05/2013   REFERRING PROVIDER: Donalee Citrin MD  REFERRING DIAG: Cervicalgia.  THERAPY DIAG:  Cervicalgia  Other muscle spasm  Abnormal posture  Rationale for Evaluation and Treatment Rehabilitation  ONSET DATE: Ongoing.  11/12/21 (surgery date).  SUBJECTIVE:                                                                                                                                                                                                          SUBJECTIVE STATEMENT: Little better today. PERTINENT HISTORY:  Prior spinal surgeries, spinal stimulator, PTSD, OA, DM, HOH, left CTR, left shoulder surgery.  PAIN:  Are you having pain? Yes: NPRS scale: 3/10 Pain location: Bilateral neck, left > right. Pain description: As above. Aggravating factors: As above. Relieving factors: As above.  PRECAUTIONS: Other: ACDF.  PATIENT GOALS:  Decrease pain and do more with less pain.  OBJECTIVE:        TODAY's TX:    [place] at 1.50 W/CM2 x 12 minutes to bilateral UT muscle bellies f/b STW/W x 12 minutes to patient's bilateral UT's with ischemic release technique utilized. Modalities  Date:  Unattended Estim: Cervical, Pre-Mod 80-150 Hz, 20 mins, Pain and Tone Hot Pack: Cervical, 20 mins, Pain and Tone          ASSESSMENT:  CLINICAL IMPRESSION:   Patient with a bit less pain today.  Decreased tone in right UT today.  Good response to treatments thus far. GOALS:   SHORT TERM GOALS: Target date: 07/07/2022   Ind with an HEP. Baseline:  Goal status: INITIAL  LONG TERM GOALS: Target date: 08/04/2022  Bilateral active cervical rotation to 60 degrees. Baseline:  Goal status: INITIAL  2.  Perform ADL's with neck pain not > 4/10. Baseline:  Goal status: INITIAL  3.  Sleep undisturbed without waking due to neck pain. Baseline:  Goal status: INITIAL     PLAN: PT FREQUENCY: 2x/week  PT DURATION: other: 5 weeks.  PLANNED INTERVENTIONS: Therapeutic exercises, Therapeutic activity, Patient/Family education, Self Care, Dry Needling, Electrical stimulation, Cryotherapy, Moist heat, Ultrasound, and Manual therapy  PLAN FOR NEXT SESSION: Dry needling and STW/M.   Beckhem Isadore, [place], PT 06/23/2022, 1:55 PM

## 2022-06-28 ENCOUNTER — Ambulatory Visit: Payer: Medicare (Managed Care) | Admitting: Physical Therapy

## 2022-06-28 ENCOUNTER — Encounter: Payer: Self-pay | Admitting: Physical Therapy

## 2022-06-28 DIAGNOSIS — M542 Cervicalgia: Secondary | ICD-10-CM | POA: Diagnosis not present

## 2022-06-28 DIAGNOSIS — M62838 Other muscle spasm: Secondary | ICD-10-CM

## 2022-06-28 DIAGNOSIS — R293 Abnormal posture: Secondary | ICD-10-CM

## 2022-06-28 NOTE — Therapy (Signed)
OUTPATIENT PHYSICAL THERAPY CERVICAL EVALUATION   Patient Name: Aimee [name] MRN: [id number] DOB:04-20-1971, 51 y.o., female Today's Date: 06/28/2022   PT End of Session - 06/28/22 1337     Visit Number 6    Number of Visits 10    Date for PT Re-Evaluation 07/20/22    Authorization Type FOTO AT LEAST EVERY 5TH VISIT.  PROGRESS NOTE AT 10TH VISIT.  KX MODIFIER AFTER 15 VISITS.    PT Start Time 0104    PT Stop Time 0158    PT Time Calculation (min) 54 min    Activity Tolerance Patient tolerated treatment well    Behavior During Therapy [facility] for tasks assessed/performed              Past Medical History:  Diagnosis Date   Arthritis    Atypical mole 10/31/2012   R neck-mod. (WS)   Depression    Diabetes mellitus    type II    Family history of adverse reaction to anesthesia    mother- mother has lots of alleriges    GERD (gastroesophageal reflux disease)    Hidradenitis suppurativa    Hidradenitis suppurativa    Hypertension    Hypertriglyceridemia    Neuromuscular disorder (HCC)    diabetic neuropathy    PONV (postoperative nausea and vomiting)    PTSD (post-traumatic stress disorder)    PTSD (post-traumatic stress disorder)    Past Surgical History:  Procedure Laterality Date   ABDOMINAL HYSTERECTOMY  2010   ABDOMINOPLASTY  2006   ANTERIOR CERVICAL DECOMP/DISCECTOMY FUSION N/A 11/12/2021   Procedure: Anterior Cervical Discectomy Fusion - Cervical Six - Cervical Seven Removal of Hardware - Cervical Four - Cervical Six;  Surgeon: Donalee Citrin, MD;  Location: [facility] OR;  Service: Neurosurgery;  Laterality: N/A;   BREAST EXCISIONAL BIOPSY Right    CESAREAN SECTION  1991, 1995   x2   CHOLECYSTECTOMY N/A 01/11/2018   Procedure: LAPAROSCOPIC CHOLECYSTECTOMY;  Surgeon: Gaynelle Adu, MD;  Location: [facility];  Service: General;  Laterality: N/A;  PT HAS PTSD AND IS AGGREGATED BY WAITING FOR LONG PERIODS OF TIME.   COCHLEAR IMPLANT Right    Bone Anchored Hearing Aid, x2    EXCISION VAGINAL CYST Left 01/23/2020   Procedure: EXCISION OF LABIAL VULVAR SEBCEOUS CYSTS;  Surgeon: Tilda Burrow, MD;  Location: [facility];  Service: Gynecology;  Laterality: Left;   INNER EAR SURGERY     12 surgeries on R ear starting in 1984; implant 06/2011   left  carpal tunnel surgery      LIVER BIOPSY N/A 01/11/2018   Procedure: LIVER BIOPSY;  Surgeon: Gaynelle Adu, MD;  Location: [facility];  Service: General;  Laterality: N/A;   SHOULDER ARTHROSCOPY Left 10/2015   SPINAL CORD STIMULATOR IMPLANT  2019   THORACIC DISC SURGERY  2011, 2012   x2   TONSILLECTOMY  12/26/2017   TUBAL LIGATION     Patient Active Problem List   Diagnosis Date Noted   Spinal stenosis of cervical region 11/12/2021   Hidradenitis suppurativa    Unilateral primary osteoarthritis, left hip 10/22/2019   Hx of fusion of cervical spine 02/27/2019   Tendinopathy of rotator cuff, left 02/27/2019   Aortic atherosclerosis (HCC) 05/22/2018   Ganglion cyst of finger 09/13/2017   Status post placement of bone anchored hearing aid (BAHA) 08/14/2017   Fibromyalgia 11/29/2016   Lumbar radiculopathy 11/21/2016   Essential hypertension 04/22/2016   Severe obesity (BMI >= 40) (HCC) 10/08/2015   PTSD (post-traumatic stress  disorder) 09/10/2015   Trapezius strain 08/19/2015   Depression 08/19/2015   Hypertriglyceridemia 11/12/2014   Peripheral neuropathy 11/12/2014   DDD (degenerative disc disease), lumbar 11/12/2014   Varicose veins of leg with pain, bilateral 05/12/2014   Diabetes (HCC) 03/05/2013   Smoking 03/05/2013   REFERRING PROVIDER: Donalee Citrin MD  REFERRING DIAG: Cervicalgia.  THERAPY DIAG:  Cervicalgia  Other muscle spasm  Abnormal posture  Rationale for Evaluation and Treatment Rehabilitation  ONSET DATE: Ongoing.  11/12/21 (surgery date).  SUBJECTIVE:                                                                                                                                                                                                          SUBJECTIVE STATEMENT: Pain up over the weekend.  More on left side. PERTINENT HISTORY:  Prior spinal surgeries, spinal stimulator, PTSD, OA, DM, HOH, left CTR, left shoulder surgery.  PAIN:  Are you having pain? Yes: NPRS scale: 5/10 Pain location: Bilateral neck, left > right. Pain description: As above. Aggravating factors: As above. Relieving factors: As above.  PRECAUTIONS: Other: ACDF.  PATIENT GOALS:  Decrease pain and do more with less pain.  OBJECTIVE:   Dry needling to left UT.       TODAY's TX:    [place] at 1.50 W/CM2 x 12 minutes to left UT muscle bellies f/b STW/W x 11 minutes to patient's left UT and Levator scapulae with ischemic release technique utilized. Modalities  Date:  Unattended Estim: Cervical, Pre-Mod 80-150 Hz, 20 mins, Pain and Tone Hot Pack: Cervical, 20 mins, Pain and Tone To left cervical region.         ASSESSMENT:  CLINICAL IMPRESSION:   Patient with increased pain over the weekend.  She presented to the clinic with a pain-level of 5/10 mostly on the left.  Treatment focused on left cervical musculature today.  She felt better after treatment. GOALS:   SHORT TERM GOALS: Target date: 07/12/2022   Ind with an HEP. Baseline:  Goal status: INITIAL  LONG TERM GOALS: Target date: 08/09/2022  Bilateral active cervical rotation to 60 degrees. Baseline:  Goal status: INITIAL  2.  Perform ADL's with neck pain not > 4/10. Baseline:  Goal status: INITIAL  3.  Sleep undisturbed without waking due to neck pain. Baseline:  Goal status: INITIAL     PLAN: PT FREQUENCY: 2x/week  PT DURATION: other: 5 weeks.  PLANNED INTERVENTIONS: Therapeutic exercises, Therapeutic activity, Patient/Family education, Self Care, Dry Needling, Electrical stimulation, Cryotherapy, Moist heat, Ultrasound, and Manual therapy  PLAN FOR  NEXT SESSION: Dry needling and STW/M.   Ismaeel Arvelo, [place], PT 06/28/2022,  2:00 PM

## 2022-06-30 ENCOUNTER — Encounter: Payer: Medicare (Managed Care) | Admitting: Physical Therapy

## 2022-07-05 ENCOUNTER — Ambulatory Visit: Payer: Medicare (Managed Care) | Admitting: *Deleted

## 2022-07-05 ENCOUNTER — Encounter: Payer: Self-pay | Admitting: *Deleted

## 2022-07-05 DIAGNOSIS — M542 Cervicalgia: Secondary | ICD-10-CM

## 2022-07-05 DIAGNOSIS — M62838 Other muscle spasm: Secondary | ICD-10-CM

## 2022-07-05 NOTE — Therapy (Signed)
OUTPATIENT PHYSICAL THERAPY CERVICAL EVALUATION   Patient Name: Aimee [name] MRN: [id number] DOB:1971-02-17, 51 y.o., female Today's Date: 07/05/2022   PT End of Session - 07/05/22 1607     Visit Number 7    Number of Visits 10    Date for PT Re-Evaluation 07/20/22    Authorization Type FOTO AT LEAST EVERY 5TH VISIT.  PROGRESS NOTE AT 10TH VISIT.  KX MODIFIER AFTER 15 VISITS.    PT Start Time 1600    PT Stop Time 1650    PT Time Calculation (min) 50 min              Past Medical History:  Diagnosis Date   Arthritis    Atypical mole 10/31/2012   R neck-mod. (WS)   Depression    Diabetes mellitus    type II    Family history of adverse reaction to anesthesia    mother- mother has lots of alleriges    GERD (gastroesophageal reflux disease)    Hidradenitis suppurativa    Hidradenitis suppurativa    Hypertension    Hypertriglyceridemia    Neuromuscular disorder (HCC)    diabetic neuropathy    PONV (postoperative nausea and vomiting)    PTSD (post-traumatic stress disorder)    PTSD (post-traumatic stress disorder)    Past Surgical History:  Procedure Laterality Date   ABDOMINAL HYSTERECTOMY  2010   ABDOMINOPLASTY  2006   ANTERIOR CERVICAL DECOMP/DISCECTOMY FUSION N/A 11/12/2021   Procedure: Anterior Cervical Discectomy Fusion - Cervical Six - Cervical Seven Removal of Hardware - Cervical Four - Cervical Six;  Surgeon: Donalee Citrin, MD;  Location: [facility] OR;  Service: Neurosurgery;  Laterality: N/A;   BREAST EXCISIONAL BIOPSY Right    CESAREAN SECTION  1991, 1995   x2   CHOLECYSTECTOMY N/A 01/11/2018   Procedure: LAPAROSCOPIC CHOLECYSTECTOMY;  Surgeon: Gaynelle Adu, MD;  Location: [facility];  Service: General;  Laterality: N/A;  PT HAS PTSD AND IS AGGREGATED BY WAITING FOR LONG PERIODS OF TIME.   COCHLEAR IMPLANT Right    Bone Anchored Hearing Aid, x2   EXCISION VAGINAL CYST Left 01/23/2020   Procedure: EXCISION OF LABIAL VULVAR SEBCEOUS CYSTS;  Surgeon: Tilda Burrow,  MD;  Location: [facility];  Service: Gynecology;  Laterality: Left;   INNER EAR SURGERY     12 surgeries on R ear starting in 1984; implant 06/2011   left  carpal tunnel surgery      LIVER BIOPSY N/A 01/11/2018   Procedure: LIVER BIOPSY;  Surgeon: Gaynelle Adu, MD;  Location: [facility];  Service: General;  Laterality: N/A;   SHOULDER ARTHROSCOPY Left 10/2015   SPINAL CORD STIMULATOR IMPLANT  2019   THORACIC DISC SURGERY  2011, 2012   x2   TONSILLECTOMY  12/26/2017   TUBAL LIGATION     Patient Active Problem List   Diagnosis Date Noted   Spinal stenosis of cervical region 11/12/2021   Hidradenitis suppurativa    Unilateral primary osteoarthritis, left hip 10/22/2019   Hx of fusion of cervical spine 02/27/2019   Tendinopathy of rotator cuff, left 02/27/2019   Aortic atherosclerosis (HCC) 05/22/2018   Ganglion cyst of finger 09/13/2017   Status post placement of bone anchored hearing aid (BAHA) 08/14/2017   Fibromyalgia 11/29/2016   Lumbar radiculopathy 11/21/2016   Essential hypertension 04/22/2016   Severe obesity (BMI >= 40) (HCC) 10/08/2015   PTSD (post-traumatic stress disorder) 09/10/2015   Trapezius strain 08/19/2015   Depression 08/19/2015   Hypertriglyceridemia 11/12/2014   Peripheral neuropathy  11/12/2014   DDD (degenerative disc disease), lumbar 11/12/2014   Varicose veins of leg with pain, bilateral 05/12/2014   Diabetes (HCC) 03/05/2013   Smoking 03/05/2013   REFERRING PROVIDER: Donalee Citrin MD  REFERRING DIAG: Cervicalgia.  THERAPY DIAG:  Cervicalgia  Other muscle spasm  Rationale for Evaluation and Treatment Rehabilitation  ONSET DATE: Ongoing.  11/12/21 (surgery date).  SUBJECTIVE:                                                                                                                                                                                                         SUBJECTIVE STATEMENT: Pain today 4- 5/10 both sides PERTINENT HISTORY:  Prior spinal  surgeries, spinal stimulator, PTSD, OA, DM, HOH, left CTR, left shoulder surgery.  PAIN:  Are you having pain? Yes: NPRS scale: 5/10 Pain location: Bilateral neck, left > right. Pain description: As above. Aggravating factors: As above. Relieving factors: As above.  PRECAUTIONS: Other: ACDF.  PATIENT GOALS:  Decrease pain and do more with less pain.  OBJECTIVE:   07-05-22        TODAY's TX:   Combo  [place] at 1.50 W/CM2 x 12 minutes to left UT/Levator muscles f/b STW/W x 13 minutes to patient's left UT and Levator scapulae with ischemic release technique utilized. Modalities  Date:  Unattended Estim: Cervical, IFC  80-150 Hz, 15 mins, Pain and Tone Hot Pack: Cervical, 20 mins, Pain and Tone To left cervical region.         ASSESSMENT:  CLINICAL IMPRESSION:   Pt arrived today doing about the same with neck pain. Rx focused on Bil. UT's and levator, but mainly LT due to higher pain levels. Pt did well with Rx and reports decreased pain and tightness end of session, but reports pain usually goes back up later.   GOALS:   SHORT TERM GOALS: Target date: 07/19/2022   Ind with an HEP. Baseline:  Goal status: INITIAL  LONG TERM GOALS: Target date: 08/16/2022  Bilateral active cervical rotation to 60 degrees. Baseline:  Goal status: INITIAL  2.  Perform ADL's with neck pain not > 4/10. Baseline:  Goal status: INITIAL  3.  Sleep undisturbed without waking due to neck pain. Baseline:  Goal status: INITIAL     PLAN: PT FREQUENCY: 2x/week  PT DURATION: other: 5 weeks.  PLANNED INTERVENTIONS: Therapeutic exercises, Therapeutic activity, Patient/Family education, Self Care, Dry Needling, Electrical stimulation, Cryotherapy, Moist heat, Ultrasound, and Manual therapy  PLAN FOR NEXT SESSION: Dry needling and STW/M.   Suezette Lafave,CHR[place], PTA 07/05/2022, 5:20 PM

## 2022-07-29 ENCOUNTER — Other Ambulatory Visit: Payer: Self-pay

## 2022-07-29 ENCOUNTER — Encounter (HOSPITAL_BASED_OUTPATIENT_CLINIC_OR_DEPARTMENT_OTHER): Payer: Self-pay | Admitting: Plastic Surgery

## 2022-07-29 NOTE — Progress Notes (Signed)
   07/29/22 1142  Pre-op Phone Call  Surgery Date Verified 08/09/22  Arrival Time Verified 1115  Surgery Location Verified [facility]   Medical History Reviewed Yes  Is the patient taking a GLP-1 receptor agonist? No (not taken in months)  Does the patient have diabetes? Type II  Does the patient use a Continuous Blood Glucose Monitor? No  Is the patient on an insulin pump? No  Has the diabetes coordinator been notified? No  Do you have a history of heart problems? No  Patient educated on enhanced recovery. N/A  Patient educated about smoking cessation 24 hours prior to surgery. Yes  Patient verbalizes understanding of bowel prep? N/A  Med Rec Completed Yes  Take the Following Meds the Morning of Surgery take all meds at bedtime as usual. Bring remote for spinal cord stimulator.  Recent  Lab Work, EKG, CXR? Yes  NPO (Including gum & candy) After midnight  Allowed clear liquids Water;Gatorade  (diabetics please choose diet or no sugar options)  Patient instructed to stop clear liquids including Carb loading drink at: 0830  Stop Solids, Milk, Candy, and Gum STARTING AT MIDNIGHT  Did patient view EMMI videos? No  Responsible adult to drive and be with you for 24 hours? Yes  Name & Phone Number for Ride/Caregiver Husband Abe People ( has gotten pre approval for Abe People to come to pre op with her to prevent 'a ptsd event')  No Jewelry, money, nail polish or make-up.  No lotions, powders, perfumes. No shaving  48 hrs. prior to surgery. Yes  Contacts, Dentures & Glasses Will Have to be Removed Before OR. Yes  Please bring your ID and Insurance Card the morning of your surgery. (Surgery Centers Only) Yes  Bring any papers or x-rays with you that your surgeon gave you. Yes  Instructed to contact the location of procedure/ provider if they or anyone in their household develops symptoms or tests positive for COVID-19, has close contact with someone who tests positive for COVID, or has known exposure to any  contagious illness. Yes  Call this number the morning of surgery  with any problems that may cancel your surgery. [phone]

## 2022-08-01 ENCOUNTER — Encounter (HOSPITAL_BASED_OUTPATIENT_CLINIC_OR_DEPARTMENT_OTHER)
Admission: RE | Admit: 2022-08-01 | Discharge: 2022-08-01 | Disposition: A | Discharge status: Home / Self Care | Payer: Medicare (Managed Care) | Source: Ambulatory Visit | Attending: Plastic Surgery | Admitting: Plastic Surgery

## 2022-08-01 ENCOUNTER — Ambulatory Visit: Payer: Self-pay | Admitting: Plastic Surgery

## 2022-08-01 DIAGNOSIS — Z01818 Encounter for other preprocedural examination: Secondary | ICD-10-CM | POA: Diagnosis present

## 2022-08-01 DIAGNOSIS — E119 Type 2 diabetes mellitus without complications: Secondary | ICD-10-CM | POA: Insufficient documentation

## 2022-08-01 LAB — BASIC METABOLIC PANEL
Anion gap: 11 (ref 5–15)
BUN: 11 mg/dL (ref 6–20)
CO2: 26 mmol/L (ref 22–32)
Calcium: 9.3 mg/dL (ref 8.9–10.3)
Chloride: 102 mmol/L (ref 98–111)
Creatinine, Ser: 0.68 mg/dL (ref 0.44–1.00)
GFR, Estimated: >60 mL/min (ref >60)
Glucose, Bld: 106 mg/dL — ABNORMAL HIGH (ref 70–99)
Potassium: 4.3 mmol/L (ref 3.5–5.1)
Sodium: 139 mmol/L (ref 135–145)

## 2022-08-09 ENCOUNTER — Ambulatory Visit (HOSPITAL_BASED_OUTPATIENT_CLINIC_OR_DEPARTMENT_OTHER): Payer: Medicare (Managed Care) | Admitting: Anesthesiology

## 2022-08-09 ENCOUNTER — Ambulatory Visit (HOSPITAL_BASED_OUTPATIENT_CLINIC_OR_DEPARTMENT_OTHER)
Admission: RE | Admit: 2022-08-09 | Discharge: 2022-08-09 | Disposition: A | Discharge status: Home / Self Care | Payer: Medicare (Managed Care) | Attending: Plastic Surgery | Admitting: Plastic Surgery

## 2022-08-09 ENCOUNTER — Encounter (HOSPITAL_BASED_OUTPATIENT_CLINIC_OR_DEPARTMENT_OTHER)
Admission: RE | Disposition: A | Discharge status: Home / Self Care | Payer: Self-pay | Source: Home / Self Care | Attending: Plastic Surgery

## 2022-08-09 ENCOUNTER — Other Ambulatory Visit: Payer: Self-pay

## 2022-08-09 ENCOUNTER — Encounter (HOSPITAL_BASED_OUTPATIENT_CLINIC_OR_DEPARTMENT_OTHER): Payer: Self-pay | Admitting: Plastic Surgery

## 2022-08-09 DIAGNOSIS — H0231 Blepharochalasis right upper eyelid: Secondary | ICD-10-CM | POA: Diagnosis not present

## 2022-08-09 DIAGNOSIS — M797 Fibromyalgia: Secondary | ICD-10-CM | POA: Insufficient documentation

## 2022-08-09 DIAGNOSIS — I10 Essential (primary) hypertension: Secondary | ICD-10-CM | POA: Insufficient documentation

## 2022-08-09 DIAGNOSIS — F17200 Nicotine dependence, unspecified, uncomplicated: Secondary | ICD-10-CM | POA: Diagnosis not present

## 2022-08-09 DIAGNOSIS — L987 Excessive and redundant skin and subcutaneous tissue: Secondary | ICD-10-CM

## 2022-08-09 DIAGNOSIS — Z7984 Long term (current) use of oral hypoglycemic drugs: Secondary | ICD-10-CM | POA: Diagnosis not present

## 2022-08-09 DIAGNOSIS — E119 Type 2 diabetes mellitus without complications: Secondary | ICD-10-CM | POA: Insufficient documentation

## 2022-08-09 DIAGNOSIS — H0234 Blepharochalasis left upper eyelid: Secondary | ICD-10-CM | POA: Diagnosis present

## 2022-08-09 DIAGNOSIS — M1990 Unspecified osteoarthritis, unspecified site: Secondary | ICD-10-CM | POA: Insufficient documentation

## 2022-08-09 HISTORY — DX: Methicillin resistant Staphylococcus aureus infection, unspecified site: A49.02

## 2022-08-09 HISTORY — PX: BROW LIFT: SHX178

## 2022-08-09 LAB — GLUCOSE, CAPILLARY
Glucose-Capillary: 132 mg/dL — ABNORMAL HIGH (ref 70–99)
Glucose-Capillary: 159 mg/dL — ABNORMAL HIGH (ref 70–99)

## 2022-08-09 SURGERY — BLEPHAROPLASTY
Anesthesia: General | Site: Eye | Laterality: Bilateral

## 2022-08-09 MED ORDER — PHENYLEPHRINE HCL-NACL 20-0.9 MG/250ML-% IV SOLN
INTRAVENOUS | Status: DC | PRN
  Administered 2022-08-09: 40 ug/min via INTRAVENOUS

## 2022-08-09 MED ORDER — HEPARIN SODIUM (PORCINE) 5000 UNIT/ML IJ SOLN
INTRAMUSCULAR | Status: AC
  Filled 2022-08-09: qty 1

## 2022-08-09 MED ORDER — ACETAMINOPHEN 500 MG PO TABS
1000.0000 mg | ORAL_TABLET | Freq: Once | ORAL | Status: AC
  Administered 2022-08-09: 1000 mg via ORAL

## 2022-08-09 MED ORDER — LACTATED RINGERS IV SOLN: INTRAVENOUS | Status: DC

## 2022-08-09 MED ORDER — LIDOCAINE-EPINEPHRINE 1 %-1:100000 IJ SOLN
INTRAMUSCULAR | Status: AC
  Filled 2022-08-09: qty 1

## 2022-08-09 MED ORDER — EPHEDRINE SULFATE (PRESSORS) 50 MG/ML IJ SOLN
INTRAMUSCULAR | Status: DC | PRN
  Administered 2022-08-09 (×2): 5 mg via INTRAVENOUS

## 2022-08-09 MED ORDER — WHITE PETROLATUM EX OINT
TOPICAL_OINTMENT | CUTANEOUS | Status: DC | PRN
  Administered 2022-08-09: 1 via TOPICAL

## 2022-08-09 MED ORDER — CEFAZOLIN SODIUM-DEXTROSE 2-4 GM/100ML-% IV SOLN
INTRAVENOUS | Status: AC
  Filled 2022-08-09: qty 100

## 2022-08-09 MED ORDER — FENTANYL CITRATE (PF) 100 MCG/2ML IJ SOLN
INTRAMUSCULAR | Status: AC
  Filled 2022-08-09: qty 2

## 2022-08-09 MED ORDER — LIDOCAINE 2% (20 MG/ML) 5 ML SYRINGE
INTRAMUSCULAR | Status: AC
  Filled 2022-08-09: qty 5

## 2022-08-09 MED ORDER — MIDAZOLAM HCL 5 MG/5ML IJ SOLN
INTRAMUSCULAR | Status: DC | PRN
  Administered 2022-08-09: 2 mg via INTRAVENOUS

## 2022-08-09 MED ORDER — DEXAMETHASONE SODIUM PHOSPHATE 4 MG/ML IJ SOLN
INTRAMUSCULAR | Status: DC | PRN
  Administered 2022-08-09: 8 mg via INTRAVENOUS

## 2022-08-09 MED ORDER — DEXAMETHASONE SODIUM PHOSPHATE 10 MG/ML IJ SOLN
INTRAMUSCULAR | Status: AC
  Filled 2022-08-09: qty 1

## 2022-08-09 MED ORDER — LIDOCAINE-EPINEPHRINE 1 %-1:100000 IJ SOLN
INTRAMUSCULAR | Status: DC | PRN
  Administered 2022-08-09: 5.5 mL

## 2022-08-09 MED ORDER — PHENYLEPHRINE HCL (PRESSORS) 10 MG/ML IV SOLN
INTRAVENOUS | Status: AC
  Filled 2022-08-09: qty 1

## 2022-08-09 MED ORDER — ONDANSETRON HCL 4 MG/2ML IJ SOLN
INTRAMUSCULAR | Status: DC | PRN
  Administered 2022-08-09: 4 mg via INTRAVENOUS

## 2022-08-09 MED ORDER — CEFAZOLIN SODIUM-DEXTROSE 2-4 GM/100ML-% IV SOLN
2.0000 g | INTRAVENOUS | Status: AC
  Administered 2022-08-09: 2 g via INTRAVENOUS

## 2022-08-09 MED ORDER — ONDANSETRON HCL 4 MG/2ML IJ SOLN
INTRAMUSCULAR | Status: AC
  Filled 2022-08-09: qty 2

## 2022-08-09 MED ORDER — FENTANYL CITRATE (PF) 100 MCG/2ML IJ SOLN
INTRAMUSCULAR | Status: DC | PRN
  Administered 2022-08-09 (×2): 50 ug via INTRAVENOUS

## 2022-08-09 MED ORDER — PROPOFOL 10 MG/ML IV BOLUS
INTRAVENOUS | Status: DC | PRN
  Administered 2022-08-09: 200 mg via INTRAVENOUS

## 2022-08-09 MED ORDER — MIDAZOLAM HCL 2 MG/2ML IJ SOLN
INTRAMUSCULAR | Status: AC
  Filled 2022-08-09: qty 2

## 2022-08-09 MED ORDER — BSS IO SOLN
INTRAOCULAR | Status: AC
  Filled 2022-08-09: qty 15

## 2022-08-09 MED ORDER — ACETAMINOPHEN 500 MG PO TABS
ORAL_TABLET | ORAL | Status: AC
  Filled 2022-08-09: qty 2

## 2022-08-09 MED ORDER — FENTANYL CITRATE (PF) 100 MCG/2ML IJ SOLN
25.0000 ug | INTRAMUSCULAR | Status: DC | PRN
  Administered 2022-08-09 (×3): 25 ug via INTRAVENOUS

## 2022-08-09 MED ORDER — BSS IO SOLN
INTRAOCULAR | Status: DC | PRN
  Administered 2022-08-09: 15 mL via INTRAOCULAR

## 2022-08-09 MED ORDER — LIDOCAINE HCL (CARDIAC) PF 100 MG/5ML IV SOSY
PREFILLED_SYRINGE | INTRAVENOUS | Status: DC | PRN
  Administered 2022-08-09: 40 mg via INTRAVENOUS

## 2022-08-09 MED ORDER — HEPARIN SODIUM (PORCINE) 5000 UNIT/ML IJ SOLN
5000.0000 [IU] | Freq: Once | INTRAMUSCULAR | Status: AC
  Administered 2022-08-09: 5000 [IU] via SUBCUTANEOUS

## 2022-08-09 MED ORDER — PHENYLEPHRINE 80 MCG/ML (10ML) SYRINGE FOR IV PUSH (FOR BLOOD PRESSURE SUPPORT)
PREFILLED_SYRINGE | INTRAVENOUS | Status: DC | PRN
  Administered 2022-08-09 (×2): 80 ug via INTRAVENOUS

## 2022-08-09 MED ORDER — WHITE PETROLATUM EX OINT
TOPICAL_OINTMENT | CUTANEOUS | Status: AC
  Filled 2022-08-09: qty 28.35

## 2022-08-09 SURGICAL SUPPLY — 37 items
APL SRG 3 HI ABS STRL LF PLS (MISCELLANEOUS) ×2
APL SWBSTK 6 STRL LF DISP (MISCELLANEOUS)
APPLICATOR COTTON TIP 6 STRL (MISCELLANEOUS) IMPLANT
APPLICATOR COTTON TIP 6IN STRL (MISCELLANEOUS) IMPLANT
APPLICATOR DR MATTHEWS STRL (MISCELLANEOUS) ×1 IMPLANT
BANDAGE EYE OVAL 2 1/8 X 2 5/8 (GAUZE/BANDAGES/DRESSINGS) ×4
BLADE SURG 15 STRL LF DISP TIS (BLADE) ×1 IMPLANT
BLADE SURG 15 STRL SS (BLADE) ×1
BNDG EYE OVAL 2 1/8 X 2 5/8 (GAUZE/BANDAGES/DRESSINGS) IMPLANT
COVER BACK TABLE 60X90IN (DRAPES) ×1 IMPLANT
COVER MAYO STAND STRL (DRAPES) ×1 IMPLANT
DRAPE U-SHAPE 76X120 STRL (DRAPES) ×1 IMPLANT
ELECT NDL BLADE 2-5/6 (NEEDLE) ×1 IMPLANT
ELECT NEEDLE BLADE 2-5/6 (NEEDLE) ×1 IMPLANT
ELECT REM PT RETURN 9FT ADLT (ELECTROSURGICAL) ×1
ELECTRODE REM PT RTRN 9FT ADLT (ELECTROSURGICAL) ×1 IMPLANT
GLOVE BIO SURGEON STRL SZ7.5 (GLOVE) ×1 IMPLANT
GLOVE BIOGEL PI IND STRL 7.0 (GLOVE) IMPLANT
GLOVE BIOGEL PI IND STRL 8 (GLOVE) ×1 IMPLANT
GLOVE SURG SS PI 6.5 STRL IVOR (GLOVE) IMPLANT
GOWN STRL REUS W/ TWL LRG LVL3 (GOWN DISPOSABLE) ×1 IMPLANT
GOWN STRL REUS W/ TWL XL LVL3 (GOWN DISPOSABLE) ×1 IMPLANT
GOWN STRL REUS W/TWL LRG LVL3 (GOWN DISPOSABLE) ×1
GOWN STRL REUS W/TWL XL LVL3 (GOWN DISPOSABLE) ×1
NDL HYPO 30X.5 LL (NEEDLE) ×1 IMPLANT
NEEDLE HYPO 30X.5 LL (NEEDLE) ×1 IMPLANT
PACK BASIN DAY SURGERY FS (CUSTOM PROCEDURE TRAY) ×1 IMPLANT
PENCIL SMOKE EVACUATOR (MISCELLANEOUS) ×1 IMPLANT
SHEET MEDIUM DRAPE 40X70 STRL (DRAPES) ×1 IMPLANT
SHEILD EYE MED CORNL SHD 22X21 (OPHTHALMIC RELATED) ×1
SHIELD EYE MED CORNL SHD 22X21 (OPHTHALMIC RELATED) IMPLANT
SLEEVE SCD COMPRESS KNEE MED (STOCKING) IMPLANT
SPIKE FLUID TRANSFER (MISCELLANEOUS) ×1 IMPLANT
SUT PROLENE 6 0 P 1 18 (SUTURE) ×2 IMPLANT
SUT SILK 6 0 P 1 (SUTURE) IMPLANT
SYR CONTROL 10ML LL (SYRINGE) ×1 IMPLANT
TOWEL GREEN STERILE FF (TOWEL DISPOSABLE) ×2 IMPLANT

## 2022-08-09 NOTE — Discharge Instructions (Addendum)
Prop up.  Keep head elevated.  Expect swelling, bruising, and some drainage from incisions.  Use ice 15 minutes each hour when awake. A bag of frozen peas works well. Stop the ice on Thursday.  No lifting or exercising.  No shower for two days then okay. Pat the incisions dry gently after shower.  Reapply Vaseline if desired but stop it on Saturday.  DO NOT use antibiotic ointment of any kind.  Use artificial tears whenever needed. Your eyes will feel dry.  When ready to sleep place Lacrilube or whatever eye ointment you purchased.  See Dr. Harlow Mares in the office next week.   May take Tylenol after 3:30pm, if needed.   Post Anesthesia Home Care Instructions  Activity: Get plenty of rest for the remainder of the day. A responsible individual must stay with you for 24 hours following the procedure.  For the next 24 hours, DO NOT: -Drive a car -Paediatric nurse -Drink alcoholic beverages -Take any medication unless instructed by your physician -Make any legal decisions or sign important papers.  Meals: Start with liquid foods such as gelatin or soup. Progress to regular foods as tolerated. Avoid greasy, spicy, heavy foods. If nausea and/or vomiting occur, drink only clear liquids until the nausea and/or vomiting subsides. Call your physician if vomiting continues.  Special Instructions/Symptoms: Your throat may feel dry or sore from the anesthesia or the breathing tube placed in your throat during surgery. If this causes discomfort, gargle with warm salt water. The discomfort should disappear within 24 hours.  If you had a scopolamine patch placed behind your ear for the management of post- operative nausea and/or vomiting:  1. The medication in the patch is effective for 72 hours, after which it should be removed.  Wrap patch in a tissue and discard in the trash. Wash hands thoroughly with soap and water. 2. You may remove the patch earlier than 72 hours if you experience  unpleasant side effects which may include dry mouth, dizziness or visual disturbances. 3. Avoid touching the patch. Wash your hands with soap and water after contact with the patch.

## 2022-08-09 NOTE — Anesthesia Preprocedure Evaluation (Addendum)
Anesthesia Evaluation  Patient identified by MRN, date of birth, ID band Patient awake    Reviewed: Allergy & Precautions, NPO status , Patient's Chart, lab work & pertinent test results  History of Anesthesia Complications (+) PONV and history of anesthetic complications  Airway Mallampati: I  TM Distance: >3 FB Neck ROM: Full    Dental no notable dental hx. (+) Teeth Intact, Dental Advisory Given   Pulmonary Current SmokerPatient did not abstain from smoking.   Pulmonary exam normal breath sounds clear to auscultation       Cardiovascular hypertension, Pt. on medications Normal cardiovascular exam Rhythm:Regular Rate:Normal  EKG LAFB   Neuro/Psych  PSYCHIATRIC DISORDERS Anxiety Depression       GI/Hepatic ,GERD  ,,  Endo/Other  diabetes, Type 2, Oral Hypoglycemic Agents    Renal/GU      Musculoskeletal  (+) Arthritis ,  Fibromyalgia -S/p ACDF 2023, s/p spinal cord stimulator 2019 - turned off   Abdominal   Peds  Hematology   Anesthesia Other Findings   Reproductive/Obstetrics                             Anesthesia Physical Anesthesia Plan  ASA: 2  Anesthesia Plan: General   Post-op Pain Management: Tylenol PO (pre-op)*   Induction: Intravenous  PONV Risk Score and Plan: 3 and Ondansetron, Dexamethasone and Midazolam  Airway Management Planned: LMA and Oral ETT  Additional Equipment:   Intra-op Plan:   Post-operative Plan: Extubation in OR  Informed Consent: I have reviewed the patients History and Physical, chart, labs and discussed the procedure including the risks, benefits and alternatives for the proposed anesthesia with the patient or authorized representative who has indicated his/her understanding and acceptance.     Dental advisory given  Plan Discussed with: CRNA  Anesthesia Plan Comments:         Anesthesia Quick Evaluation

## 2022-08-09 NOTE — Anesthesia Procedure Notes (Signed)
Procedure Name: LMA Insertion Date/Time: 08/09/2022 11:35 AM  Performed by: Tawni Millers, CRNAPre-anesthesia Checklist: Patient identified, Emergency Drugs available, Suction available and Patient being monitored Patient Re-evaluated:Patient Re-evaluated prior to induction Oxygen Delivery Method: Circle system utilized Preoxygenation: Pre-oxygenation with 100% oxygen Induction Type: IV induction Ventilation: Mask ventilation without difficulty LMA: LMA inserted LMA Size: 4.0 Number of attempts: 1 Airway Equipment and Method: Bite block Placement Confirmation: positive ETCO2 Tube secured with: Tape Dental Injury: Teeth and Oropharynx as per pre-operative assessment

## 2022-08-09 NOTE — Op Note (Signed)
Aimee [name], EDINGER MEDICAL RECORD NO: [id number] ACCOUNT NO: [id number] DATE OF BIRTH: 08-16-1971 FACILITY: [facility] LOCATION: [facility] PHYSICIAN: Youlanda Roys. Harlow Mares, MD  Operative Report   DATE OF PROCEDURE: 08/09/2022   PREOPERATIVE DIAGNOSIS:  Excess skin of upper eyelids, bilateral.  POSTOPERATIVE DIAGNOSIS:  Excess skin of upper eyelids, bilateral.  PROCEDURE PERFORMED:  Upper lid blepharoplasty.  SURGEON:  Youlanda Roys. Harlow Mares, MD.  ANESTHESIA:  General with local 1% Xylocaine with epinephrine.    Estimated blood loss minimal.    CLINICAL NOTE:  A 51 year old woman presented with a complaint of excess skin of her upper eyelids with some blockage of her superior visual fields.  She desired an upper lid blepharoplasty.  The procedure, risks and possible complications were discussed  with her in detail and all of her questions were answered and she voiced understanding of our discussion and wished to proceed.  DESCRIPTION OF PROCEDURE:  The patient was marked in a full upright position in the holding area.  She was then taken to the operating room and placed supine.  After the successful induction of general anesthesia, she was prepped with Betadine and draped  with sterile drapes.  The caliper was used to mark the lower limb of the planned skin excisions 7 mm above the lid margin.  Using the gathering technique with a smooth forceps the check was made for the amount of excess skin to be removed.  This was  then marked carefully and then rechecked to make certain that the eyes were closed with the skin removed.  This gathering technique having been utilized the upper limb of the planned skin excision was then marked.  Local anesthesia was infiltrated using  1% Xylocaine with epinephrine and the skin excisions were performed.  A narrow strip of orbicularis oculi muscle was excised from the superior portion of the wound and then the orbital septum was opened medially.  Medial fat pads were  teased out and then  were amputated slowly using the electrocautery.  She was irrigated with BSS solution and meticulous hemostasis was gained with the electrocautery.  The closure with 6-0 Prolene running subcuticular suture with a couple of reinforcing sutures placed  laterally on each side.  Vaseline and iced eye-pads were then placed and she was transferred to the recovery room in stable, having tolerated the procedure well and the disposition she will follow up in the office next week.      SUJ D: 08/09/2022 12:47:44 pm T: 08/09/2022 9:38:00 pm  JOB: [id number]/ [id number]

## 2022-08-09 NOTE — Brief Op Note (Signed)
08/09/2022  12:41 PM  PATIENT:  Aimee [name]  51 y.o. female  PRE-OPERATIVE DIAGNOSIS:  EXCESSIVE SKIN UPRISE  POST-OPERATIVE DIAGNOSIS:  EXCESSIVE SKIN UPRISE  PROCEDURE:  Procedure(s): UPPER LID BLEPHAROPLASTY (Bilateral)  SURGEON:  Surgeon(s) and Role:    * Crissie Reese, MD - Primary  PHYSICIAN ASSISTANT:   ASSISTANTS: none   ANESTHESIA:   general  EBL:  10 mL   BLOOD ADMINISTERED:none  DRAINS: none   LOCAL MEDICATIONS USED:  XYLOCAINE   SPECIMEN:  No Specimen  DISPOSITION OF SPECIMEN:  N/A  COUNTS:  YES  TOURNIQUET:  * No tourniquets in log *  DICTATION: .Other Dictation: Dictation Number [id number]  PLAN OF CARE: Discharge to home after PACU  PATIENT DISPOSITION:  PACU - hemodynamically stable.   Delay start of Pharmacological VTE agent (>24hrs) due to surgical blood loss or risk of bleeding: not applicable

## 2022-08-09 NOTE — Anesthesia Postprocedure Evaluation (Signed)
Anesthesia Post Note  Patient: Aimee [name]  Procedure(s) Performed: UPPER LID BLEPHAROPLASTY (Bilateral: Eye)     Patient location during evaluation: PACU Anesthesia Type: General Level of consciousness: awake and alert Pain management: pain level controlled Vital Signs Assessment: post-procedure vital signs reviewed and stable Respiratory status: spontaneous breathing, nonlabored ventilation, respiratory function stable and patient connected to nasal cannula oxygen Cardiovascular status: blood pressure returned to baseline and stable Postop Assessment: no apparent nausea or vomiting Anesthetic complications: no  No notable events documented.  Last Vitals:  Vitals:   08/09/22 1327 08/09/22 1330  BP:  113/75  Pulse:  85  Resp:  15  Temp:    SpO2: 96% 97%    Last Pain:  Vitals:   08/09/22 1330  TempSrc:   PainSc: 4                  Zaveon Gillen L Shambria Camerer

## 2022-08-09 NOTE — Transfer of Care (Signed)
Immediate Anesthesia Transfer of Care Note  Patient: Aimee [name]  Procedure(s) Performed: UPPER LID BLEPHAROPLASTY (Bilateral: Eye)  Patient Location: PACU  Anesthesia Type:General  Level of Consciousness: awake  Airway & Oxygen Therapy: Patient Spontanous Breathing and Patient connected to face mask oxygen  Post-op Assessment: Report given to RN and Post -op Vital signs reviewed and stable  Post vital signs: Reviewed and stable  Last Vitals:  Vitals Value Taken Time  BP    Temp    Pulse 84 08/09/22 1253  Resp 16 08/09/22 1253  SpO2 95 % 08/09/22 1253  Vitals shown include unvalidated device data.  Last Pain:  Vitals:   08/09/22 0925  TempSrc: Oral  PainSc: 4       Patients Stated Pain Goal: 4 (48/25/00 3704)  Complications: No notable events documented.

## 2022-08-09 NOTE — H&P (Signed)
I have Re-evaluated and re-examined the patient and there are no changes.  See office notes in paper chart for H&P.

## 2022-08-10 ENCOUNTER — Encounter (HOSPITAL_BASED_OUTPATIENT_CLINIC_OR_DEPARTMENT_OTHER): Payer: Self-pay | Admitting: Plastic Surgery

## 2023-01-06 ENCOUNTER — Ambulatory Visit: Payer: Medicare HMO | Admitting: Orthopaedic Surgery

## 2023-01-06 ENCOUNTER — Encounter: Payer: Self-pay | Admitting: Physician Assistant

## 2023-01-06 ENCOUNTER — Other Ambulatory Visit: Payer: Self-pay

## 2023-01-06 ENCOUNTER — Emergency Department (HOSPITAL_BASED_OUTPATIENT_CLINIC_OR_DEPARTMENT_OTHER)
Admission: EM | Admit: 2023-01-06 | Discharge: 2023-01-06 | Disposition: A | Discharge status: Home / Self Care | Payer: Medicare HMO | Attending: Emergency Medicine | Admitting: Emergency Medicine

## 2023-01-06 VITALS — BP 128/90 | HR 103 | Ht 68.0 in | Wt 190.0 lb

## 2023-01-06 DIAGNOSIS — M25512 Pain in left shoulder: Secondary | ICD-10-CM | POA: Diagnosis not present

## 2023-01-06 MED ORDER — LIDOCAINE HCL 1 % IJ SOLN
0.5000 mL | INTRAMUSCULAR | Status: AC | PRN
  Administered 2023-01-06: .5 mL

## 2023-01-06 MED ORDER — OXYCODONE-ACETAMINOPHEN 5-325 MG PO TABS: 1.0000 | ORAL_TABLET | ORAL | 0 refills | Status: AC | PRN

## 2023-01-06 MED ORDER — BUPIVACAINE HCL 0.25 % IJ SOLN
8.0000 mL | INTRAMUSCULAR | Status: AC | PRN
  Administered 2023-01-06: 8 mL via INTRA_ARTICULAR

## 2023-01-06 MED ORDER — BUPIVACAINE HCL 0.25 % IJ SOLN
3.0000 mL | INTRAMUSCULAR | Status: AC | PRN
  Administered 2023-01-06: 3 mL via INTRA_ARTICULAR

## 2023-01-06 NOTE — ED Provider Notes (Signed)
[place] EMERGENCY DEPARTMENT AT [facility] Provider Note   CSN: [id number] Arrival date & time: 01/06/23  1701     History No chief complaint on file.   Aimee [name] is a 52 y.o. female presenting today requesting an MRI due to her left shoulder pain.  She says that her orthopedic physician sent her here.  Reports that she failed shoulder injections and does not like the oxycodone that was prescribed to her because it "makes her itch."  HPI     Home Medications Prior to Admission medications   Medication Sig Start Date End Date Taking? Authorizing Provider  amLODipine (NORVASC) 5 MG tablet Take 1 tablet (5 mg total) by mouth daily. 01/21/20   Mechele Claude, MD  atorvastatin (LIPITOR) 40 MG tablet Take 1 tablet (40 mg total) by mouth daily. 01/21/20   Mechele Claude, MD  baclofen (LIORESAL) 10 MG tablet Take 1 tablet (10 mg total) by mouth 3 (three) times daily. 10/22/19   Mechele Claude, MD  Calcium Carb-Cholecalciferol (CALCIUM 600 + D PO) Take 1 tablet by mouth daily.    [provider]  dicyclomine (BENTYL) 10 MG capsule TAKE ONE CAPSULE THREE TIMES A DAY AS NEEDED FOR ABDOMINAL CRAMPING AND DIARRHEA Patient taking differently: Take 10 mg by mouth 3 (three) times daily as needed for spasms (and diarrhea). 08/27/19   Esterwood, Amy S, PA-C  empagliflozin (JARDIANCE) 10 MG TABS tablet TAKE 1 TABLET BY MOUTH ONCE DAILY TO CONTROL DIABETES 03/17/20   Mechele Claude, MD  ezetimibe (ZETIA) 10 MG tablet Take 1 tablet (10 mg total) by mouth daily. 11/25/19   Mechele Claude, MD  fenofibrate 160 MG tablet Take 1 tablet (160 mg total) by mouth daily. For cholesterol and triglyceride 11/25/19   Mechele Claude, MD  gabapentin (NEURONTIN) 600 MG tablet Take 1 each morning two each afternoon and 3 at bedtime 03/12/20   Mechele Claude, MD  glucose blood test strip Use as instructed 11/21/17   Mechele Claude, MD  hydrOXYzine (VISTARIL) 50 MG capsule Take one in am for anxiety and two  at bed time. 03/12/20   Arfeen, Phillips Grout, MD  lamoTRIgine (LAMICTAL) 100 MG tablet Take 1 tablet (100 mg total) by mouth daily. 03/12/20   Cleotis Nipper, MD  Lancets MISC Use to check blood sugars twice daily 11/21/17   Mechele Claude, MD  omeprazole (PRILOSEC) 20 MG capsule Take 1 capsule (20 mg total) by mouth daily. 11/25/19   Mechele Claude, MD  oxyCODONE-acetaminophen (PERCOCET/ROXICET) 5-325 MG tablet Take 1-2 tablets by mouth every 4 (four) hours as needed for severe pain. 01/06/23   Eldred Manges, MD      Allergies    Hydrocodone, Oxycodone, Tramadol, Mirtazapine, Percocet [oxycodone-acetaminophen], and Darvocet [propoxyphene n-acetaminophen]    Review of Systems   Review of Systems  Physical Exam Updated Vital Signs BP 119/85   Pulse (!) 104   Temp 98.6 F (37 C) (Oral)   Resp 15   Ht 5\' 8"  (1.727 m)   Wt 86.2 kg   SpO2 100%   BMI 28.89 kg/m  Physical Exam Vitals and nursing note reviewed.  Constitutional:      Appearance: Normal appearance.  HENT:     Head: Normocephalic and atraumatic.  Eyes:     General: No scleral icterus.    Conjunctiva/sclera: Conjunctivae normal.  Pulmonary:     Effort: Pulmonary effort is normal. No respiratory distress.  Musculoskeletal:     Comments: Patient's arm is in a sling.  Strong radial pulse  Skin:    Findings: No rash.  Neurological:     Mental Status: She is alert.  Psychiatric:        Mood and Affect: Mood normal.     ED Results / Procedures / Treatments   Labs (all labs ordered are listed, but only abnormal results are displayed) Labs Reviewed - No data to display  EKG None  Radiology No results found.  Procedures Procedures   Medications Ordered in ED Medications - No data to display  ED Course/ Medical Decision Making/ A&P                             Medical Decision Making  Per chart review patient had a biceps tendon injection earlier today.  She continued to have pain so Percocet was sent to the  pharmacy.  She was told that if she did not have any improvement of her symptoms she should go to the emergency department.   Unfortunately, we do not have MRI on this site.  Additionally, she is neurovascularly intact and there is no indication for an emergent MRI from the emergency department.  She will continue to follow with orthopedics.  Patient is very upset that she waited in the emergency department and we are unable to perform her MRI.  She continues to state that somebody at the front should have made her aware of this.  We discussed EMTALA and ultimately she will need to follow-up outpatient.  She was already prescribed Percocet so there do not need to be any further prescriptions from me.   Final Clinical Impression(s) / ED Diagnoses Final diagnoses:  Acute pain of left shoulder    Rx / DC Orders ED Discharge Orders     None      Results and diagnoses were explained to the patient. Return precautions discussed in full. Patient had no additional questions and expressed complete understanding.   This chart was dictated using voice recognition software.  Despite best efforts to proofread,  errors can occur which can change the documentation meaning.    Woodroe Chen 01/06/23 1841    Maia Plan, MD 01/11/23 [phone]

## 2023-01-06 NOTE — Progress Notes (Signed)
Office Visit Note   Patient: Aimee [name]           Date of Birth: 05-Feb-1971           MRN: [id number] Visit Date: 01/06/2023              Requested by: Mattie Marlin, DO [address] [place],  [place] [address] PCP: Mattie Marlin, DO   Assessment & Plan: Visit Diagnoses:  1. Acute pain of left shoulder     Plan: First injected 4 cc around the long head of the biceps tendon with no improvement other than less tenderness with palpation.  She states she is still was in excruciating pain and 15 minutes later did a subacromial injection with 8 cc of bupivacaine after sterile prep.  Aspiration prior to injection did not reveal any purulence or any fluid.  After the bupivacaine and subacromial space patient had no improvement.  She sterile still was crying and tearful.  I sent in some Percocet to her pharmacy in [place] if she is not any better she could be seen in the emergency room and consider MRI scan of her shoulder to see if she has a ganglion from the shoulder joint that could be causing suprascapular nerve compression or tear of the rotator cuff.  She states her pain remains 10 out of 10.  Percocet 30 tablets was sent and she can take 1 or 2 every 6 hours as needed for pain.  Follow-Up Instructions: No follow-ups on file.   Orders:  Orders Placed This Encounter  Procedures   XR Shoulder Left   No orders of the defined types were placed in this encounter.     Procedures: Large Joint Inj: R glenohumeral on 01/06/2023 5:00 PM Indications: pain Details: 22 G 1.5 in needle  Arthrogram: No  Medications: 0.5 mL lidocaine 1 %; 3 mL bupivacaine 0.25 % Outcome: tolerated well, no immediate complications Procedure, treatment alternatives, risks and benefits explained, specific risks discussed. Consent was given by the patient. Immediately prior to procedure a time out was called to verify the correct patient, procedure, equipment, support staff and site/side marked as required.  Patient was prepped and draped in the usual sterile fashion.    Large Joint Inj: R subacromial bursa on 01/06/2023 5:00 PM Indications: pain Details: 22 G 1.5 in needle  Arthrogram: No  Medications: 8 mL bupivacaine 0.25 %; 0.5 mL lidocaine 1 % Outcome: tolerated well, no immediate complications Procedure, treatment alternatives, risks and benefits explained, specific risks discussed. Consent was given by the patient. Immediately prior to procedure a time out was called to verify the correct patient, procedure, equipment, support staff and site/side marked as required. Patient was prepped and draped in the usual sterile fashion.       Clinical Data: No additional findings.   Subjective: Chief Complaint  Patient presents with   Left Shoulder - Pain    HPI 52 year old female here with her daughter with her left arm in a sling states she is having excruciating severe left shoulder pain she rates it 12 out of 10.  She is crying tearful has wet washcloth over her forehead.  Pain started severe on Tuesday gradually is increased now 3 days later on Friday she cannot stand it.  She states she has not been able to sleep.  She did have past history of ganglion excision left acromioclavicular joint.  She states her pain is a burning type pain she does have some numbness  in her forearm but has no pain with range of motion of her neck.  She thinks the numbness may be related to being in a sling for 2 days. She states she cannot find a position to get comfortable.  Last cervical MRI 2022 with previous fusion C3-C6 which is solid.  Dr. Wynetta Emery did a C6-7 fusion on 11/12/2021. Review of Systems patient chills or fever.  No history of gout or other rheumatologic conditions.   Objective: Vital Signs: BP (!) 128/90   Pulse (!) 103   Ht 5\' 8"  (1.727 m)   Wt 190 lb (86.2 kg)   BMI 28.89 kg/m   Physical Exam Constitutional:      Appearance: She is well-developed.  HENT:     Head: Normocephalic.      Right Ear: External ear normal.     Left Ear: External ear normal. There is no impacted cerumen.  Eyes:     Pupils: Pupils are equal, round, and reactive to light.  Neck:     Thyroid: No thyromegaly.     Trachea: No tracheal deviation.  Cardiovascular:     Rate and Rhythm: Normal rate.  Pulmonary:     Effort: Pulmonary effort is normal.  Abdominal:     Palpations: Abdomen is soft.  Musculoskeletal:     Cervical back: No rigidity.  Skin:    General: Skin is warm and dry.  Neurological:     Mental Status: She is alert and oriented to person, place, and time.  Psychiatric:        Behavior: Behavior normal.      Ortho Exam patient has exquisite tenderness anteriorly over the long head of the biceps tendon.  She can abduct her shoulder has excruciating pain.  Axillary sensation radial medial ulnar sensations intact no brachial plexus tenderness no increased pain with cervical compression.  Normal gait pattern with arm tightly held due to her chest with the sling.  Specialty Comments:  No specialty comments available.  Imaging: No results found.   PMFS History: Patient Active Problem List   Diagnosis Date Noted   Pain in left shoulder 01/06/2023   Spinal stenosis of cervical region 11/12/2021   Hidradenitis suppurativa    Unilateral primary osteoarthritis, left hip 10/22/2019   Hx of fusion of cervical spine 02/27/2019   Tendinopathy of rotator cuff, left 02/27/2019   Aortic atherosclerosis (HCC) 05/22/2018   Ganglion cyst of finger 09/13/2017   Status post placement of bone anchored hearing aid (BAHA) 08/14/2017   Fibromyalgia 11/29/2016   Lumbar radiculopathy 11/21/2016   Essential hypertension 04/22/2016   Severe obesity (BMI >= 40) (HCC) 10/08/2015   PTSD (post-traumatic stress disorder) 09/10/2015   Trapezius strain 08/19/2015   Depression 08/19/2015   Hypertriglyceridemia 11/12/2014   Peripheral neuropathy 11/12/2014   DDD (degenerative disc disease), lumbar  11/12/2014   Varicose veins of leg with pain, bilateral 05/12/2014   Diabetes (HCC) 03/05/2013   Smoking 03/05/2013   Past Medical History:  Diagnosis Date   Arthritis    Atypical mole 10/31/2012   R neck-mod. (WS)   Depression    Diabetes mellitus    type II    Family history of adverse reaction to anesthesia    mother- mother has lots of alleriges    GERD (gastroesophageal reflux disease)    Hidradenitis suppurativa    Hidradenitis suppurativa    Hypertension    Hypertriglyceridemia    MRSA infection    Neuromuscular disorder (HCC)    diabetic neuropathy  PONV (postoperative nausea and vomiting)    PTSD (post-traumatic stress disorder)     Family History  Problem Relation Age of Onset   GI problems Mother    Mental illness Mother    Bipolar disorder Mother    Diabetes Father    Breast cancer Maternal Aunt        in 61's   Liver cancer Maternal Aunt    Bipolar disorder Maternal Aunt    Schizophrenia Maternal Aunt    Personality disorder Maternal Aunt    Colon cancer Maternal Grandmother    Heart attack Maternal Grandfather    Heart attack Paternal Grandmother    Heart attack Paternal Grandfather    Heart attack Paternal Uncle    Lymphoma Paternal Uncle    Bone cancer Cousin     Past Surgical History:  Procedure Laterality Date   ABDOMINAL HYSTERECTOMY  2010   ABDOMINOPLASTY  2006   ANTERIOR CERVICAL DECOMP/DISCECTOMY FUSION N/A 11/12/2021   Procedure: Anterior Cervical Discectomy Fusion - Cervical Six - Cervical Seven Removal of Hardware - Cervical Four - Cervical Six;  Surgeon: Donalee Citrin, MD;  Location: [facility] OR;  Service: Neurosurgery;  Laterality: N/A;   BREAST EXCISIONAL BIOPSY Right    BROW LIFT Bilateral 08/09/2022   Procedure: UPPER LID BLEPHAROPLASTY;  Surgeon: Etter Sjogren, MD;  Location: [facility];  Service: Plastics;  Laterality: Bilateral;   CESAREAN SECTION  1991, 1995   x2   CHOLECYSTECTOMY N/A 01/11/2018   Procedure:  LAPAROSCOPIC CHOLECYSTECTOMY;  Surgeon: Gaynelle Adu, MD;  Location: [facility];  Service: General;  Laterality: N/A;  PT HAS PTSD AND IS AGGREGATED BY WAITING FOR LONG PERIODS OF TIME.   COCHLEAR IMPLANT Right    No longer in place.   EXCISION VAGINAL CYST Left 01/23/2020   Procedure: EXCISION OF LABIAL VULVAR SEBCEOUS CYSTS;  Surgeon: Tilda Burrow, MD;  Location: [facility];  Service: Gynecology;  Laterality: Left;   INNER EAR SURGERY     12 surgeries on R ear starting in 1984; implant 06/2011   left  carpal tunnel surgery      LIVER BIOPSY N/A 01/11/2018   Procedure: LIVER BIOPSY;  Surgeon: Gaynelle Adu, MD;  Location: [facility];  Service: General;  Laterality: N/A;   SHOULDER ARTHROSCOPY Left 10/2015   SPINAL CORD STIMULATOR IMPLANT  2019   THORACIC DISC SURGERY  2011, 2012   x2   TONSILLECTOMY  12/26/2017   TUBAL LIGATION     Social History   Occupational History    Comment: [employer]  Tobacco Use   Smoking status: Every Day    Packs/day: 0.25    Years: 23.00    Additional pack years: 0.00    Total pack years: 5.75    Types: Cigarettes   Smokeless tobacco: Never  Vaping Use   Vaping Use: Never used  Substance and Sexual Activity   Alcohol use: No    Alcohol/week: 0.0 standard drinks of alcohol   Drug use: No   Sexual activity: Yes    Partners: Male    Birth control/protection: Surgical    Comment: hyst

## 2023-01-06 NOTE — ED Triage Notes (Signed)
Hx surgery on left shoulder. Saw Ortho today for ongoing pain- ortho said MRI would take a week to go through them. Recommended to come here.

## 2023-01-06 NOTE — ED Notes (Addendum)
PA noted that the Pt after their conversation ambulated without issue out of the emergency room and left... Pt also noted that she verbally discharged the Pt bc the Pt didn't want the discharge paperwork...  PA noted that to me... I never evaluated or assesses the Pt... Pt left without discharge paperwork, V/S.Marland KitchenMarland Kitchen

## 2023-01-09 ENCOUNTER — Telehealth: Payer: Self-pay | Admitting: Orthopaedic Surgery

## 2023-01-09 DIAGNOSIS — M25512 Pain in left shoulder: Secondary | ICD-10-CM

## 2023-01-09 NOTE — Telephone Encounter (Signed)
Patient went to ED on Friday as instructed, waited 2 hours, was then told MRI staff had gone home and that they would not order MRI anyway as it was not life threatening. She has had great difficulty this weekend, continued pain, unable to sleep, nausea and vomiting. The oxycodone takes the edge off, however, it makes her itch and she is taking benadryl every 4 hours. She called her insurance company and was told she does not have to have authorization for MRI.  She would like to know if you would order emergent MRI scan?  Please advise.

## 2023-01-09 NOTE — Telephone Encounter (Signed)
STAT MRI ordered. [name] to work on getting scheduled today.

## 2023-01-09 NOTE — Telephone Encounter (Signed)
Patient was told to Virl Axe a call this morning please advise Patient was here Friday and Ophelia Charter told her to go to the ER and wanted an update

## 2023-01-09 NOTE — Addendum Note (Signed)
Addended by: Rogers Seeds on: 01/09/2023 09:54 AM   Modules accepted: Orders

## 2023-01-09 NOTE — Telephone Encounter (Signed)
Patient is scheduled for MRI at 0900 in the morning. I advised I will hold message and send to Dr Ophelia Charter once we have report.

## 2023-01-10 ENCOUNTER — Encounter: Payer: Self-pay | Admitting: Orthopaedic Surgery

## 2023-01-10 ENCOUNTER — Ambulatory Visit (INDEPENDENT_AMBULATORY_CARE_PROVIDER_SITE_OTHER): Payer: Medicare HMO | Admitting: Orthopaedic Surgery

## 2023-01-10 ENCOUNTER — Ambulatory Visit (INDEPENDENT_AMBULATORY_CARE_PROVIDER_SITE_OTHER): Payer: Medicare HMO

## 2023-01-10 VITALS — BP 113/78 | HR 92 | Ht 68.0 in | Wt 190.0 lb

## 2023-01-10 DIAGNOSIS — M25512 Pain in left shoulder: Secondary | ICD-10-CM | POA: Diagnosis not present

## 2023-01-10 DIAGNOSIS — M11212 Other chondrocalcinosis, left shoulder: Secondary | ICD-10-CM | POA: Diagnosis not present

## 2023-01-10 MED ORDER — METHYLPREDNISOLONE ACETATE 40 MG/ML IJ SUSP
40.0000 mg | INTRAMUSCULAR | Status: AC | PRN
  Administered 2023-01-10: 40 mg via INTRA_ARTICULAR

## 2023-01-10 MED ORDER — BUPIVACAINE HCL 0.25 % IJ SOLN
4.0000 mL | INTRAMUSCULAR | Status: AC | PRN
  Administered 2023-01-10: 4 mL via INTRA_ARTICULAR

## 2023-01-10 MED ORDER — LIDOCAINE HCL 1 % IJ SOLN
0.5000 mL | INTRAMUSCULAR | Status: AC | PRN
  Administered 2023-01-10: .5 mL

## 2023-01-10 NOTE — Progress Notes (Signed)
1  Office Visit Note   Patient: Aimee [name]           Date of Birth: 05-20-1971           MRN: [id number] Visit Date: 01/10/2023              Requested by: Mattie Marlin, DO [address] [place],  [place] [address] PCP: Mattie Marlin, DO   Assessment & Plan: Visit Diagnoses:  1. Pseudogout of shoulder, left     Plan: Patient mention to [facility] she only has 8 tablets for pain left we will see how she does with the subacromial injection.  She has good cervical range of motion and this pain is present with attempts at moving the shoulder and she can rapidly move her neck and rotation and tilting without pain.  I will check her back again in 1 week.  Follow-Up Instructions: Return in about 1 week (around 01/17/2023).   Orders:  No orders of the defined types were placed in this encounter.  No orders of the defined types were placed in this encounter.     Procedures: Large Joint Inj: L subacromial bursa on 01/10/2023 4:29 PM Indications: pain Details: 22 G 1.5 in needle  Arthrogram: No  Medications: 4 mL bupivacaine 0.25 %; 40 mg methylPREDNISolone acetate 40 MG/ML; 0.5 mL lidocaine 1 % Outcome: tolerated well, no immediate complications Procedure, treatment alternatives, risks and benefits explained, specific risks discussed. Consent was given by the patient. Immediately prior to procedure a time out was called to verify the correct patient, procedure, equipment, support staff and site/side marked as required. Patient was prepped and draped in the usual sterile fashion.       Clinical Data: No additional findings.   Subjective: Chief Complaint  Patient presents with   Left Shoulder - Pain, Follow-up    MRI review    HPI patient returns post urgent MRI for left shoulder which she could not move with severe pain rated greater than 10 out of 10.  MRI scan shows some crystals subacromial bursa consistent with pseudogout.  Her plain radiograph showed some spot calcium  deposit overlying needs to in the top of the rotator cuff supraspinatus tendon or possibly in the bursa directly over it.  She returns today for review of MRI and for subacromial cortisone injection.  Review of Systems updated unchanged   Objective: Vital Signs: BP 113/78   Pulse 92   Ht 5\' 8"  (1.727 m)   Wt 190 lb (86.2 kg)   BMI 28.89 kg/m   Physical Exam Constitutional:      Appearance: She is well-developed.  HENT:     Head: Normocephalic.     Right Ear: External ear normal.     Left Ear: External ear normal. There is no impacted cerumen.  Eyes:     Pupils: Pupils are equal, round, and reactive to light.  Neck:     Thyroid: No thyromegaly.     Trachea: No tracheal deviation.  Cardiovascular:     Rate and Rhythm: Normal rate.  Pulmonary:     Effort: Pulmonary effort is normal.  Abdominal:     Palpations: Abdomen is soft.  Musculoskeletal:     Cervical back: No rigidity.  Skin:    General: Skin is warm and dry.  Neurological:     Mental Status: She is alert and oriented to person, place, and time.  Psychiatric:        Behavior: Behavior normal.  Ortho Exam patient still unable to move her left shoulder.  After Betadine prep and discussion subacromial cortisone and Marcaine injection performed.  Will see if this settles down her symptoms.  Specialty Comments:  No specialty comments available.  Imaging: MR Shoulder Left w/o contrast  Result Date: 01/10/2023 CLINICAL DATA:  Acute left shoulder pain with limited range of motion, weakness and numbness for 1 week. History cyst removal. No known injury. EXAM: MRI OF THE LEFT SHOULDER WITHOUT CONTRAST TECHNIQUE: Multiplanar, multisequence MR imaging of the shoulder was performed. No intravenous contrast was administered. COMPARISON:  Radiographs 01/06/2023.  MRI 02/21/2019 FINDINGS: Rotator cuff: Mild supraspinatus tendinosis without evidence of rotator cuff tear. In correlation with the recent radiographs, there is  possible hydroxyapatite deposition along the bursal surface of the distal supraspinatus tendon (best seen on coronal image 14/4 and sagittal image 16/7). There is surrounding ill-defined fluid and inflammation within the subacromial-subdeltoid and the subcoracoid bursa. The infraspinatus, subscapularis and teres minor tendons appear normal. Muscles:  No focal muscular atrophy or edema. Biceps long head:  Intact and normally positioned. Acromioclavicular Joint: The acromion is type 2. There are moderate acromioclavicular degenerative changes. As above, fluid in the subacromial-subdeltoid bursa with surrounding soft tissue edema and suspected underlying hydroxyapatite deposition. Glenohumeral Joint: No significant shoulder joint effusion or glenohumeral arthropathy. Labrum: Labral assessment limited by the lack of joint fluid. No evidence of labral tear or paralabral cyst. Bones: No acute or significant extra-articular osseous findings. Other: No other significant soft tissue findings. IMPRESSION: 1. Suspected hydroxyapatite deposition along the bursal surface of the distal supraspinatus tendon with associated subacromial-subdeltoid and subcoracoid bursitis. 2. No evidence of rotator cuff tear. The biceps tendon and labrum appear intact. 3. Moderate acromioclavicular degenerative changes. No acute osseous findings. Electronically Signed   By: Carey Bullocks M.D.   On: 01/10/2023 10:07     PMFS History: Patient Active Problem List   Diagnosis Date Noted   Pseudogout of shoulder, left 01/10/2023   Pain in left shoulder 01/06/2023   Spinal stenosis of cervical region 11/12/2021   Hidradenitis suppurativa    Unilateral primary osteoarthritis, left hip 10/22/2019   Hx of fusion of cervical spine 02/27/2019   Tendinopathy of rotator cuff, left 02/27/2019   Aortic atherosclerosis (HCC) 05/22/2018   Ganglion cyst of finger 09/13/2017   Status post placement of bone anchored hearing aid (BAHA) 08/14/2017    Fibromyalgia 11/29/2016   Lumbar radiculopathy 11/21/2016   Essential hypertension 04/22/2016   Severe obesity (BMI >= 40) (HCC) 10/08/2015   PTSD (post-traumatic stress disorder) 09/10/2015   Trapezius strain 08/19/2015   Depression 08/19/2015   Hypertriglyceridemia 11/12/2014   Peripheral neuropathy 11/12/2014   DDD (degenerative disc disease), lumbar 11/12/2014   Varicose veins of leg with pain, bilateral 05/12/2014   Diabetes (HCC) 03/05/2013   Smoking 03/05/2013   Past Medical History:  Diagnosis Date   Arthritis    Atypical mole 10/31/2012   R neck-mod. (WS)   Depression    Diabetes mellitus    type II    Family history of adverse reaction to anesthesia    mother- mother has lots of alleriges    GERD (gastroesophageal reflux disease)    Hidradenitis suppurativa    Hidradenitis suppurativa    Hypertension    Hypertriglyceridemia    MRSA infection    Neuromuscular disorder (HCC)    diabetic neuropathy    PONV (postoperative nausea and vomiting)    PTSD (post-traumatic stress disorder)     Family History  Problem Relation Age of Onset   GI problems Mother    Mental illness Mother    Bipolar disorder Mother    Diabetes Father    Breast cancer Maternal Aunt        in 69's   Liver cancer Maternal Aunt    Bipolar disorder Maternal Aunt    Schizophrenia Maternal Aunt    Personality disorder Maternal Aunt    Colon cancer Maternal Grandmother    Heart attack Maternal Grandfather    Heart attack Paternal Grandmother    Heart attack Paternal Grandfather    Heart attack Paternal Uncle    Lymphoma Paternal Uncle    Bone cancer Cousin     Past Surgical History:  Procedure Laterality Date   ABDOMINAL HYSTERECTOMY  2010   ABDOMINOPLASTY  2006   ANTERIOR CERVICAL DECOMP/DISCECTOMY FUSION N/A 11/12/2021   Procedure: Anterior Cervical Discectomy Fusion - Cervical Six - Cervical Seven Removal of Hardware - Cervical Four - Cervical Six;  Surgeon: Donalee Citrin, MD;  Location:  [facility] OR;  Service: Neurosurgery;  Laterality: N/A;   BREAST EXCISIONAL BIOPSY Right    BROW LIFT Bilateral 08/09/2022   Procedure: UPPER LID BLEPHAROPLASTY;  Surgeon: Etter Sjogren, MD;  Location: [facility];  Service: Plastics;  Laterality: Bilateral;   CESAREAN SECTION  1991, 1995   x2   CHOLECYSTECTOMY N/A 01/11/2018   Procedure: LAPAROSCOPIC CHOLECYSTECTOMY;  Surgeon: Gaynelle Adu, MD;  Location: [facility];  Service: General;  Laterality: N/A;  PT HAS PTSD AND IS AGGREGATED BY WAITING FOR LONG PERIODS OF TIME.   COCHLEAR IMPLANT Right    No longer in place.   EXCISION VAGINAL CYST Left 01/23/2020   Procedure: EXCISION OF LABIAL VULVAR SEBCEOUS CYSTS;  Surgeon: Tilda Burrow, MD;  Location: [facility];  Service: Gynecology;  Laterality: Left;   INNER EAR SURGERY     12 surgeries on R ear starting in 1984; implant 06/2011   left  carpal tunnel surgery      LIVER BIOPSY N/A 01/11/2018   Procedure: LIVER BIOPSY;  Surgeon: Gaynelle Adu, MD;  Location: [facility];  Service: General;  Laterality: N/A;   SHOULDER ARTHROSCOPY Left 10/2015   SPINAL CORD STIMULATOR IMPLANT  2019   THORACIC DISC SURGERY  2011, 2012   x2   TONSILLECTOMY  12/26/2017   TUBAL LIGATION     Social History   Occupational History    Comment: [employer]  Tobacco Use   Smoking status: Every Day    Packs/day: 0.25    Years: 23.00    Additional pack years: 0.00    Total pack years: 5.75    Types: Cigarettes   Smokeless tobacco: Never  Vaping Use   Vaping Use: Never used  Substance and Sexual Activity   Alcohol use: No    Alcohol/week: 0.0 standard drinks of alcohol   Drug use: No   Sexual activity: Yes    Partners: Male    Birth control/protection: Surgical    Comment: hyst

## 2023-01-10 NOTE — Telephone Encounter (Signed)
Patient is coming in this afternoon for MRI review

## 2023-01-17 ENCOUNTER — Encounter: Payer: Self-pay | Admitting: Orthopaedic Surgery

## 2023-05-04 LAB — COLOGUARD: COLOGUARD: NEGATIVE
